# Patient Record
Sex: Male | Born: 1950 | Race: White | Hispanic: No | Marital: Married | Smoking: Former smoker
Health system: Southern US, Community
[De-identification: ages and names within clinical notes are randomized; demographics above are authoritative.]

## PROBLEM LIST (undated history)

## (undated) DIAGNOSIS — I2510 Atherosclerotic heart disease of native coronary artery without angina pectoris: Secondary | ICD-10-CM

## (undated) DIAGNOSIS — R001 Bradycardia, unspecified: Secondary | ICD-10-CM

## (undated) DIAGNOSIS — I7300 Raynaud's syndrome without gangrene: Secondary | ICD-10-CM

## (undated) DIAGNOSIS — K589 Irritable bowel syndrome without diarrhea: Secondary | ICD-10-CM

## (undated) DIAGNOSIS — R55 Syncope and collapse: Secondary | ICD-10-CM

## (undated) HISTORY — PX: ANKLE FRACTURE SURGERY: SHX122

## (undated) HISTORY — PX: CARDIAC CATHETERIZATION: SHX172

## (undated) HISTORY — DX: Raynaud's syndrome without gangrene: I73.00

## (undated) HISTORY — DX: Syncope and collapse: R55

## (undated) HISTORY — DX: Irritable bowel syndrome, unspecified: K58.9

---

## 1988-08-18 HISTORY — PX: ANTERIOR CRUCIATE LIGAMENT REPAIR: SHX115

## 1997-12-05 ENCOUNTER — Ambulatory Visit (HOSPITAL_COMMUNITY): Admission: RE | Admit: 1997-12-05 | Discharge: 1997-12-05 | Payer: Self-pay | Admitting: Orthopaedic Surgery

## 2002-09-02 ENCOUNTER — Ambulatory Visit (HOSPITAL_BASED_OUTPATIENT_CLINIC_OR_DEPARTMENT_OTHER): Admission: RE | Admit: 2002-09-02 | Discharge: 2002-09-02 | Payer: Self-pay | Admitting: Orthopedic Surgery

## 2002-11-09 ENCOUNTER — Ambulatory Visit (HOSPITAL_COMMUNITY): Admission: RE | Admit: 2002-11-09 | Discharge: 2002-11-09 | Payer: Self-pay | Admitting: Gastroenterology

## 2007-08-13 ENCOUNTER — Encounter: Admission: RE | Admit: 2007-08-13 | Discharge: 2007-08-13 | Payer: Self-pay | Admitting: Family Medicine

## 2007-08-16 ENCOUNTER — Encounter: Admission: RE | Admit: 2007-08-16 | Discharge: 2007-08-16 | Payer: Self-pay | Admitting: Family Medicine

## 2011-01-03 NOTE — Op Note (Signed)
   NAME:  Travis Jenkins, Travis Jenkins                          ACCOUNT NO.:  [id number]   MEDICAL RECORD NO.:  [id number]                   PATIENT TYPE:  AMB   LOCATION:  ENDO                                 FACILITY:  [facility]   PHYSICIAN:  John C. Madilyn Fireman, M.D.                 DATE OF BIRTH:  09-30-1950   DATE OF PROCEDURE:  11/09/2002  DATE OF DISCHARGE:                                 OPERATIVE REPORT   PROCEDURE:  Colonoscopy.   INDICATIONS FOR PROCEDURE:  Colon cancer screening in a 60 year old patient  with no prior screening.   DESCRIPTION OF PROCEDURE:  The patient was placed in the left lateral  decubitus position then placed on the pulse monitor with continuous low flow  oxygen delivered by nasal cannula. He was sedated with 100 mcg IV fentanyl  and 10 mg IV Versed. The Olympus video colonoscope was inserted into the  rectum and advanced to the cecum, confirmed by transillumination at  McBurney's point and visualization of the ileocecal valve and appendiceal  orifice. The prep was excellent. The cecum, ascending, transverse,  descending and sigmoid colon all appeared normal with no masses, polyps,  diverticula or other mucosal abnormalities. The rectum likewise appeared  normal and retroflexed view of the anus revealed no obvious internal  hemorrhoids. The colonoscope was then withdrawn and the patient returned to  the recovery room in stable condition. The patient tolerated the procedure  well and there were no immediate complications.   IMPRESSION:  Normal colonoscopy.   PLAN:  Next colon screening by sigmoidoscopy in five years.                                                John C. Madilyn Fireman, M.D.    JCH/MEDQ  D:  11/09/2002  T:  11/09/2002  Job:  [id number]   cc:   Schuyler Amor, M.D.  [address]  [place], [place] [address]  Fax: [phone]   Oley Balm. Georgina Pillion, M.D.  [address]  [place]  [place] [address]  Fax: [phone]

## 2011-01-03 NOTE — Op Note (Signed)
Travis Jenkins, Travis Jenkins                          ACCOUNT NO.:  [id number]   MEDICAL RECORD NO.:  [id number]                   PATIENT TYPE:  AMB   LOCATION:  DSC                                  FACILITY:  [facility]   PHYSICIAN:  Loreta Ave, M.D.              DATE OF BIRTH:  1950/11/08   DATE OF PROCEDURE:  09/02/2002  DATE OF DISCHARGE:                                 OPERATIVE REPORT   PREOPERATIVE DIAGNOSIS:  Medial meniscus tear, left knee.   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   PROCEDURES:  1. Left knee examined under anesthesia.  2. Arthroscopy with partial medial meniscectomy.  3. Chondroplasty of the patella and lateral tibial plateau.   SURGEON:  Loreta Ave, M.D.   ASSISTANT:  Arlys John D. Petrarca, P.A.-C.   ANESTHESIA:  General.   ESTIMATED BLOOD LOSS:  Minimal.   TOURNIQUET:  Not employed.   SPECIMENS:  None.   CULTURES:  None.   COMPLICATIONS:  None.   DRESSING:  Soft compressive.   DESCRIPTION OF PROCEDURE:  Patient brought to the operating room and placed  on the operating table in supine position.  After adequate anesthesia had  been obtained, left knee examined.  Full motion, good stability.  A little  patellofemoral lateral tracking and crepitus but no tethering.  Negative  Lachman and drawer.  Tourniquet and leg holder applied.  Leg prepped and  draped in the usual sterile fashion.  Three portals created, one  superolateral, one each medial and lateral peripatellar.  Inflow catheter  introduced, knee distended, arthroscope introduced, knee inspected.  Lateral  tracking not marked, no tethering.  Focal grade 2-3 chondromalacia, peak of  the patella and both facets.  Treated with chondroplasty to a stable  surface.  The trochlea looked good.  In the medial compartment some grade 1  changes, nothing marked.  Extensive degenerative change of the medial  meniscus posterior half.  Saucerized up to the rim, tapered into remaining  meniscus.  Notch inspected.  Both cruciates were intact to inspection,  probing, and Lachman.  Lateral compartment had no meniscal tear, but there  was a linear fissure with some grade 2 changes.  Margins debrided to a  stable surface.  All loose fragments were removed.  The entire knee examined  and no other  significant findings appreciated.  The instruments and fluid removed.  Portals and knee injected with Marcaine.  Portals closed with 4-0 nylon.  A  sterile compressive dressing applied.  Anesthesia reversed, brought to the  recovery room.  He tolerated the surgery well with no complications.  Loreta Ave, M.D.    DFM/MEDQ  D:  09/02/2002  T:  09/03/2002  Job:  [id number]

## 2014-02-12 ENCOUNTER — Emergency Department (HOSPITAL_COMMUNITY)
Admission: EM | Admit: 2014-02-12 | Discharge: 2014-02-12 | Disposition: A | Discharge status: Home / Self Care | Payer: 59 | Attending: Emergency Medicine | Admitting: Emergency Medicine

## 2014-02-12 ENCOUNTER — Encounter (HOSPITAL_COMMUNITY): Payer: Self-pay | Admitting: Emergency Medicine

## 2014-02-12 DIAGNOSIS — Z87891 Personal history of nicotine dependence: Secondary | ICD-10-CM | POA: Insufficient documentation

## 2014-02-12 DIAGNOSIS — Z8679 Personal history of other diseases of the circulatory system: Secondary | ICD-10-CM | POA: Insufficient documentation

## 2014-02-12 DIAGNOSIS — Z8719 Personal history of other diseases of the digestive system: Secondary | ICD-10-CM | POA: Insufficient documentation

## 2014-02-12 DIAGNOSIS — R55 Syncope and collapse: Secondary | ICD-10-CM | POA: Insufficient documentation

## 2014-02-12 LAB — CBC WITH DIFFERENTIAL/PLATELET
Basophils Absolute: 0 10*3/uL (ref 0.0–0.1)
Basophils Relative: 0 % (ref 0–1)
Eosinophils Absolute: 0 10*3/uL (ref 0.0–0.7)
Eosinophils Relative: 1 % (ref 0–5)
HCT: 40.6 % (ref 39.0–52.0)
Hemoglobin: 14.1 g/dL (ref 13.0–17.0)
Lymphocytes Relative: 15 % (ref 12–46)
Lymphs Abs: 1 10*3/uL (ref 0.7–4.0)
MCH: 31.5 pg (ref 26.0–34.0)
MCHC: 34.7 g/dL (ref 30.0–36.0)
MCV: 90.8 fL (ref 78.0–100.0)
Monocytes Absolute: 0.5 10*3/uL (ref 0.1–1.0)
Monocytes Relative: 8 % (ref 3–12)
Neutro Abs: 5.2 10*3/uL (ref 1.7–7.7)
Neutrophils Relative %: 76 % (ref 43–77)
Platelets: 211 10*3/uL (ref 150–400)
RBC: 4.47 MIL/uL (ref 4.22–5.81)
RDW: 12.6 % (ref 11.5–15.5)
WBC: 6.8 10*3/uL (ref 4.0–10.5)

## 2014-02-12 LAB — BASIC METABOLIC PANEL
BUN: 12 mg/dL (ref 6–23)
CO2: 27 mEq/L (ref 19–32)
Calcium: 9.2 mg/dL (ref 8.4–10.5)
Chloride: 99 mEq/L (ref 96–112)
Creatinine, Ser: 1.13 mg/dL (ref 0.50–1.35)
GFR calc Af Amer: 79 mL/min — ABNORMAL LOW (ref >90)
GFR calc non Af Amer: 68 mL/min — ABNORMAL LOW (ref >90)
Glucose, Bld: 89 mg/dL (ref 70–99)
Potassium: 4.2 mEq/L (ref 3.7–5.3)
Sodium: 140 mEq/L (ref 137–147)

## 2014-02-12 LAB — MAGNESIUM: Magnesium: 2.2 mg/dL (ref 1.5–2.5)

## 2014-02-12 NOTE — Discharge Instructions (Signed)
Syncope °Syncope is a medical term for fainting or passing out. This means you lose consciousness and drop to the ground. People are generally unconscious for less than 5 minutes. You may have some muscle twitches for up to 15 seconds before waking up and returning to normal. Syncope occurs more often in older adults, but it can happen to anyone. While most causes of syncope are not dangerous, syncope can be a sign of a serious medical problem. It is important to seek medical care.  °CAUSES  °Syncope is caused by a sudden drop in blood flow to the brain. The specific cause is often not determined. Factors that can bring on syncope include: °· Taking medicines that lower blood pressure. °· Sudden changes in posture, such as standing up quickly. °· Taking more medicine than prescribed. °· Standing in one place for too long. °· Seizure disorders. °· Dehydration and excessive exposure to heat. °· Low blood sugar (hypoglycemia). °· Straining to have a bowel movement. °· Heart disease, irregular heartbeat, or other circulatory problems. °· Fear, emotional distress, seeing blood, or severe pain. °SYMPTOMS  °Right before fainting, you may: °· Feel dizzy or light-headed. °· Feel nauseous. °· See all white or all black in your field of vision. °· Have cold, clammy skin. °DIAGNOSIS  °Your health care provider will ask about your symptoms, perform a physical exam, and perform an electrocardiogram (ECG) to record the electrical activity of your heart. Your health care provider may also perform other heart or blood tests to determine the cause of your syncope which may include: °· Transthoracic echocardiogram (TTE). During echocardiography, sound waves are used to evaluate how blood flows through your heart. °· Transesophageal echocardiogram (TEE). °· Cardiac monitoring. This allows your health care provider to monitor your heart rate and rhythm in real time. °· Holter monitor. This is a portable device that records your  heartbeat and can help diagnose heart arrhythmias. It allows your health care provider to track your heart activity for several days, if needed. °· Stress tests by exercise or by giving medicine that makes the heart beat faster. °TREATMENT  °In most cases, no treatment is needed. Depending on the cause of your syncope, your health care provider may recommend changing or stopping some of your medicines. °HOME CARE INSTRUCTIONS °· Have someone stay with you until you feel stable. °· Do not drive, use machinery, or play sports until your health care provider says it is okay. °· Keep all follow-up appointments as directed by your health care provider. °· Lie down right away if you start feeling like you might faint. Breathe deeply and steadily. Wait until all the symptoms have passed. °· Drink enough fluids to keep your urine clear or pale yellow. °· If you are taking blood pressure or heart medicine, get up slowly and take several minutes to sit and then stand. This can reduce dizziness. °SEEK IMMEDIATE MEDICAL CARE IF:  °· You have a severe headache. °· You have unusual pain in the chest, abdomen, or back. °· You are bleeding from your mouth or rectum, or you have black or tarry stool. °· You have an irregular or very fast heartbeat. °· You have pain with breathing. °· You have repeated fainting or seizure-like jerking during an episode. °· You faint when sitting or lying down. °· You have confusion. °· You have trouble walking. °· You have severe weakness. °· You have vision problems. °If you fainted, call your local emergency services (911 in [place] Do not drive   yourself to the hospital.  °MAKE SURE YOU: °· Understand these instructions. °· Will watch your condition. °· Will get help right away if you are not doing well or get worse. °Document Released: 08/04/2005 Document Revised: 08/09/2013 Document Reviewed: 10/03/2011 °ExitCare® Patient Information ©2015 [employer]. This information is not intended to replace  advice given to you by your health care provider. Make sure you discuss any questions you have with your health care provider. ° °

## 2014-02-12 NOTE — ED Notes (Signed)
MD Kohut at the bedside.

## 2014-02-12 NOTE — ED Notes (Signed)
Per Patient's Wife, Wife was obtaining patient's blood pressure. He was complaining of some dizziness. Patient went unresponsive with his eyes open, started to drool, and wife called 911. Patient conscious, alert, and oriented when EMS arrived. CBG 58- Given OJ. Second CBG 69. Vitals per EMS: Initial: 136/77, 122/78 Ortho, Last- 116/74, SB. Upon Arrival, Patient was alert and Oriented x4. Patient denies any chest pain or palpitations with dizziness. Patient denies nausea or vomiting.

## 2014-02-12 NOTE — ED Notes (Signed)
Dr. Wilson Singer at the bedside.

## 2014-02-12 NOTE — ED Provider Notes (Signed)
CSN: [id number]     Arrival date & time 02/12/14  1308 History   First MD Initiated Contact with Patient 02/12/14 1309     Chief Complaint  Patient presents with  . Loss of Consciousness     (Consider location/radiation/quality/duration/timing/severity/associated sxs/prior Treatment) HPI  63 year old male with syncope. Onset shortly before arrival. Patient was sitting down smoking a cigar when he began to feel lightheaded and passed out. Per his wife he appeared glassy eyes was unresponsive and was drooling. Fairly quick return to baseline. No seizure activity reported. Patient currently complaining of feeling a little bit tired, otherwise no complaints. Resting bradycardia noted, but patient reports that is baseline heart rate is around 50 at rest. He has no complaints heart rate of 45-55 while I was in the room. Patient has had previous syncopal event. He is actually had a similar episode while smoking a cigar in the past. He works out fairly regularly and has never syncopized with exertion. Reports previous normal stress testing a few years ago. Not on any medications.   History reviewed. No pertinent past medical history. Past Surgical History  Procedure Laterality Date  . Anterior cruciate ligament repair    . Ankle fracture surgery     Family History  Problem Relation Age of Onset  . Diabetes Mother   . Hyperlipidemia Father   . Diabetes Father    History  Substance Use Topics  . Smoking status: Former Smoker    Types: Cigarettes  . Smokeless tobacco: Not on file  . Alcohol Use: 2.4 oz/week    4 Cans of beer per week    Review of Systems  All systems reviewed and negative, other than as noted in HPI.   Allergies  Review of patient's allergies indicates not on file.  Home Medications   Prior to Admission medications   Not on File   BP 124/77  Pulse 45  Temp(Src) 97.5 F (36.4 C) (Oral)  Resp 13  SpO2 97% Physical Exam  Nursing note and vitals  reviewed. Constitutional: He is oriented to person, place, and time. He appears well-developed and well-nourished. No distress.  HENT:  Head: Normocephalic and atraumatic.  Eyes: Conjunctivae and EOM are normal. Pupils are equal, round, and reactive to light. Right eye exhibits no discharge. Left eye exhibits no discharge.  Neck: Neck supple.  Cardiovascular: Normal rate, regular rhythm and normal heart sounds.  Exam reveals no gallop and no friction rub.   No murmur heard. Pulmonary/Chest: Effort normal and breath sounds normal. No respiratory distress.  Abdominal: Soft. He exhibits no distension. There is no tenderness.  Musculoskeletal: He exhibits no edema and no tenderness.  Neurological: He is alert and oriented to person, place, and time. No cranial nerve deficit. He exhibits normal muscle tone. Coordination normal.  Skin: Skin is warm and dry.  Psychiatric: He has a normal mood and affect. His behavior is normal. Thought content normal.    ED Course  Procedures (including critical care time) Labs Review Labs Reviewed  BASIC METABOLIC PANEL - Abnormal; Notable for the following:    GFR calc non Af Amer 68 (*)    GFR calc Af Amer 79 (*)    All other components within normal limits  CBC WITH DIFFERENTIAL  MAGNESIUM    Imaging Review No results found.   EKG Interpretation   Date/Time:  Sunday February 12 2014 13:11:26 EDT Ventricular Rate:  44 PR Interval:  177 QRS Duration: 104 QT Interval:  452 QTC Calculation: 387  R Axis:   47 Text Interpretation:  Sinus bradycardia Minimal ST elevation, anterior  leads No old tracing to compare Confirmed by Wilson Singer  MD, Saanvika Vazques [phone]) on  02/12/2014 2:38:26 PM      MDM   Final diagnoses:  Syncope and collapse    62yM with syncope. No obvious etiology, but nothing overly concerning at this point either. Perhaps cough syncope while smoking cigar?  EKG with sinus bradycardia. Pt reports hx of same. No complaints during my exam when  HR 45-55. BP normal with this. Afebrile. No rate controlling meds. Wife doesn't describe seizure.   W/u unremarkable. Lytes fine. H/H normal. No hypotension. Remains without complaints. Will ambulate. DC if feeling fine. Return precautions discussed. PCP FU.     Virgel Manifold, MD 02/14/14 [phone]

## 2014-02-15 ENCOUNTER — Encounter: Payer: Self-pay | Admitting: *Deleted

## 2014-02-15 ENCOUNTER — Ambulatory Visit (INDEPENDENT_AMBULATORY_CARE_PROVIDER_SITE_OTHER): Payer: 59 | Admitting: Cardiovascular Disease

## 2014-02-15 VITALS — BP 132/75 | HR 45 | Ht 73.0 in | Wt 200.0 lb

## 2014-02-15 DIAGNOSIS — R55 Syncope and collapse: Secondary | ICD-10-CM

## 2014-02-15 DIAGNOSIS — R5381 Other malaise: Secondary | ICD-10-CM

## 2014-02-15 DIAGNOSIS — R079 Chest pain, unspecified: Secondary | ICD-10-CM

## 2014-02-15 DIAGNOSIS — R5383 Other fatigue: Secondary | ICD-10-CM

## 2014-02-15 DIAGNOSIS — R001 Bradycardia, unspecified: Secondary | ICD-10-CM

## 2014-02-15 DIAGNOSIS — I498 Other specified cardiac arrhythmias: Secondary | ICD-10-CM

## 2014-02-15 DIAGNOSIS — Z8249 Family history of ischemic heart disease and other diseases of the circulatory system: Secondary | ICD-10-CM

## 2014-02-15 NOTE — Patient Instructions (Signed)
Continue all current medications. Labs for TSH, freeT4 Your physician has requested that you have an echocardiogram. Echocardiography is a painless test that uses sound waves to create images of your heart. It provides your doctor with information about the size and shape of your heart and how well your heart's chambers and valves are working. This procedure takes approximately one hour. There are no restrictions for this procedure. Your physician has requested that you have en exercise stress myoview. For further information please visit HugeFiesta.tn. Please follow instruction sheet, as given. Your physician has recommended that you wear a 30 day event monitor. Event monitors are medical devices that record the heart's electrical activity. Doctors most often [place] these monitors to diagnose arrhythmias. Arrhythmias are problems with the speed or rhythm of the heartbeat. The monitor is a small, portable device. You can wear one while you do your normal daily activities. This is usually used to diagnose what is causing palpitations/syncope (passing out). Office will contact with results via phone or letter.   Follow up in  6-8 weeks

## 2014-02-15 NOTE — Progress Notes (Signed)
Patient ID: Azael Ragain, male   DOB: Jun 12, 1951, 63 y.o.   MRN: [id number]       CARDIOLOGY CONSULT NOTE  Patient ID: Lathon Adan MRN: [id number] DOB/AGE: 03-Aug-1951 63 y.o.  Admit date: (Not on file) Primary Physician Lujean Amel, MD  Reason for Consultation: Syncope and bradycardia  HPI: The patient is a 63 year old male who has been referred for the evaluation and treatment of syncope. He was evaluated in the emergency department on June 28 after sustaining a syncopal episode shortly after smoking a cigar. He had been outside on his back porch and then felt lightheaded and dizzy. He then walked into the house, sat down on a couch, and then lost consciousness. His wife said he had a "blank stare and appeared glassy-eyed". She is a Marine scientist at [employer] There was no seizure activity noted. An ECG performed in the ED demonstrated sinus bradycardia with a heart rate of 44 beats per minute. He reportedly passed out for approximately 45-60 seconds as per his wife. It was witnessed. Antecedent symptoms included lightheadedness but not chest pain, palpitations, or dyspnea. He did have urinary incontinence and some diaphoresis afterwards. He has a prior episode of near syncope also after smoking a cigar. He reportedly saw Dr. Eustace Quail several years ago and had a normal stress test. He has occasional chest discomfort but never with significant exertion including running and swimming. CBC and basic metabolic panel were unremarkable.  He had previously been running up to 5 miles but has been limited in the past several months by bilateral knee arthritis. He now swims for exercise and has no exertional chest discomfort. He denies palpitations, orthopnea, and leg swelling. He denies a history of thyroid disease. He was told he had a murmur in childhood but there is no history of childhood seizures. His wife says that he does not snore. The patient does experience significant fatigue at times  at around 2 PM and then has to take a nap.  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 Does not smoke cigarettes. Married with children.  FamHx: Father had valve surgery and aneurysm in late 50's.   Allergies  Allergen Reactions  . Erythromycin Rash  . Tetracyclines & Related Rash    Current Outpatient Prescriptions  Medication Sig Dispense Refill  . aspirin EC 81 MG tablet Take 81 mg by mouth daily.       No current facility-administered medications for this visit.    Past Medical History  Diagnosis Date  . IBS (irritable bowel syndrome)   . Raynaud's disease   . Syncope     Past Surgical History  Procedure Laterality Date  . Anterior cruciate ligament repair  1990  . Ankle fracture surgery      History   Social History  . Marital Status: Married    Spouse Name: N/A    Number of Children: N/A  . Years of Education: N/A   Occupational History  . Not on file.   Social History Main Topics  . Smoking status: Former Smoker -- 0.50 packs/day for 12 years    Types: Cigarettes    Start date: 06/20/1966    Quit date: 08/19/1971  . Smokeless tobacco: Never Used  . Alcohol Use: 2.4 oz/week    4 Cans of beer per week  . Drug Use: No  . Sexual Activity: Not on file   Other Topics Concern  . Not on file   Social History Narrative  .  No narrative on file       Prior to Admission medications   Medication Sig Start Date End Date Taking? Authorizing Provider  aspirin EC 81 MG tablet Take 81 mg by mouth daily.   Yes Historical Provider, MD     Review of systems complete and found to be negative unless listed above in HPI     Physical exam Blood pressure 132/75, pulse 45, height 6\' 1"  (1.854 m), weight 200 lb (90.719 kg). General: NAD Neck: No JVD, no thyromegaly or thyroid nodule.  Lungs: Clear to auscultation bilaterally with normal respiratory effort. CV: Nondisplaced PMI. Bradycardic, regular rhythm, normal  S1/S2, no S3/S4, no murmur.  No peripheral edema.  No carotid bruit.  Normal pedal pulses.  Abdomen: Soft, nontender, no hepatosplenomegaly, no distention.  Skin: Intact without lesions or rashes.  Neurologic: Alert and oriented x 3.  Psych: Normal affect. Extremities: No clubbing or cyanosis.  HEENT: Normal.   Labs:   Lab Results  Component Value Date   WBC 6.8 02/12/2014   HGB 14.1 02/12/2014   HCT 40.6 02/12/2014   MCV 90.8 02/12/2014   PLT 211 02/12/2014    Recent Labs Lab 02/12/14 1335  NA 140  K 4.2  CL 99  CO2 27  BUN 12  CREATININE 1.13  CALCIUM 9.2  GLUCOSE 89   No results found for this basename: CKTOTAL, CKMB, CKMBINDEX, TROPONINI    No results found for this basename: CHOL   No results found for this basename: HDL   No results found for this basename: LDLCALC   No results found for this basename: TRIG   No results found for this basename: CHOLHDL   No results found for this basename: LDLDIRECT         Studies: No results found.  ASSESSMENT AND PLAN:  1. Syncope and resting bradycardia: Given that he is apparently fit from a cardiovascular standpoint, he may have a normal resting bradycardia. However, given his syncopal episode as well as fatigue, I want to rule out readily treatable etiologies. I will check a TSH and free T4 to evaluate for hypothyroidism. I will also obtain a 30 day event monitor to evaluate for bradyarrhythmias and pauses. I will obtain an echocardiogram to evaluate for structural heart disease. I will also obtain an exercise Cardiolite stress test to evaluate for ischemia given his chest discomfort, as well as to evaluate for chronotropic incompetence. 2. Chest discomfort: Exercise Cardiolite stress test as noted above. It is reassuring that he does not experience exertional symptoms. 3. Fatigue: History does not appear to be consistent with sleep apnea. Obtain tests as noted above.  Dispo: f/u 6-8 weeks.  Signed: Kate Sable, M.D., F.A.C.C.  02/15/2014, 11:23 AM

## 2014-02-16 ENCOUNTER — Other Ambulatory Visit: Payer: Self-pay | Admitting: *Deleted

## 2014-02-16 DIAGNOSIS — R55 Syncope and collapse: Secondary | ICD-10-CM

## 2014-02-16 DIAGNOSIS — R079 Chest pain, unspecified: Secondary | ICD-10-CM

## 2014-02-16 DIAGNOSIS — R001 Bradycardia, unspecified: Secondary | ICD-10-CM

## 2014-02-22 DIAGNOSIS — R079 Chest pain, unspecified: Secondary | ICD-10-CM

## 2014-02-22 DIAGNOSIS — R55 Syncope and collapse: Secondary | ICD-10-CM

## 2014-02-23 ENCOUNTER — Encounter (HOSPITAL_COMMUNITY)
Admission: RE | Admit: 2014-02-23 | Discharge: 2014-02-23 | Disposition: A | Discharge status: Home / Self Care | Payer: 59 | Source: Ambulatory Visit | Attending: Cardiovascular Disease | Admitting: Cardiovascular Disease

## 2014-02-23 ENCOUNTER — Other Ambulatory Visit: Payer: Self-pay | Admitting: Cardiovascular Disease

## 2014-02-23 ENCOUNTER — Ambulatory Visit (HOSPITAL_COMMUNITY)
Admission: RE | Admit: 2014-02-23 | Discharge: 2014-02-23 | Disposition: A | Discharge status: Home / Self Care | Payer: 59 | Source: Ambulatory Visit | Attending: Cardiovascular Disease | Admitting: Cardiovascular Disease

## 2014-02-23 ENCOUNTER — Encounter (HOSPITAL_COMMUNITY): Payer: Self-pay

## 2014-02-23 DIAGNOSIS — R55 Syncope and collapse: Secondary | ICD-10-CM

## 2014-02-23 DIAGNOSIS — I498 Other specified cardiac arrhythmias: Secondary | ICD-10-CM | POA: Insufficient documentation

## 2014-02-23 DIAGNOSIS — R001 Bradycardia, unspecified: Secondary | ICD-10-CM

## 2014-02-23 DIAGNOSIS — R079 Chest pain, unspecified: Secondary | ICD-10-CM

## 2014-02-23 DIAGNOSIS — I059 Rheumatic mitral valve disease, unspecified: Secondary | ICD-10-CM

## 2014-02-23 MED ORDER — SODIUM CHLORIDE 0.9 % IJ SOLN
10.0000 mL | INTRAMUSCULAR | Status: DC | PRN
  Administered 2014-02-23: 10 mL via INTRAVENOUS

## 2014-02-23 MED ORDER — TECHNETIUM TC 99M SESTAMIBI - CARDIOLITE
30.0000 | Freq: Once | INTRAVENOUS | Status: AC | PRN
  Administered 2014-02-23: 12:00:00 30 via INTRAVENOUS

## 2014-02-23 MED ORDER — SODIUM CHLORIDE 0.9 % IJ SOLN
INTRAMUSCULAR | Status: AC
  Administered 2014-02-23: 10 mL via INTRAVENOUS
  Filled 2014-02-23: qty 10

## 2014-02-23 MED ORDER — TECHNETIUM TC 99M SESTAMIBI GENERIC - CARDIOLITE
10.0000 | Freq: Once | INTRAVENOUS | Status: AC | PRN
  Administered 2014-02-23: 10 via INTRAVENOUS

## 2014-02-23 NOTE — Progress Notes (Signed)
  Echocardiogram 2D Echocardiogram has been performed.  [place], [place] 02/23/2014, 9:02 AM

## 2014-02-23 NOTE — Progress Notes (Signed)
Stress Lab Nurses Notes - Travis Jenkins  Travis Jenkins 02/23/2014 Reason for doing test: Syncope Type of test: Stress Cardiolite Nurse performing test: Gerrit Halls, RN Nuclear Medicine Tech: Melburn Hake Echo Tech: Not Applicable MD performing test: Koneswaran/K.Purcell Nails NP Family MD: [facility] explained and consent signed: Yes.   IV started: 22g jelco, Saline lock flushed, No redness or edema and Saline lock started in radiology Symptoms: SOB Treatment/Intervention: None Reason test stopped: fatigue and reached target HR After recovery IV was: Discontinued via X-ray tech and No redness or edema Patient to return to Nuc. Med at : 12:30 Patient discharged: Home Patient's Condition upon discharge was: stable Comments: During test peak BP 186/79 & HR 171.  Recovery BP 157/86 & HR 88.  Symptoms resolved in recovery. [name]

## 2014-02-24 ENCOUNTER — Telehealth: Payer: Self-pay | Admitting: *Deleted

## 2014-02-24 LAB — TSH: TSH: 3.215 u[IU]/mL (ref 0.350–4.500)

## 2014-02-24 LAB — T4, FREE: Free T4: 1.04 ng/dL (ref 0.80–1.80)

## 2014-02-24 NOTE — Telephone Encounter (Signed)
Patient notified of all results below.  Follow up scheduled for 03/30/2014 with Dr. Bronson Ing.

## 2014-02-24 NOTE — Telephone Encounter (Signed)
STRESS TEST -  Notes Recorded by Herminio Commons, MD on 02/23/2014 at 3:15 PM Excellent exercise tolerance. Probably normal blood flow to heart based on nuclear pictures. Will discuss details at ov.  ECHO -  Notes Recorded by Herminio Commons, MD on 02/23/2014 at 5:51 PM Normal pumping function.  LABS -  Notes Recorded by Herminio Commons, MD on 02/24/2014 at 9:28 AM Normal.

## 2014-03-30 ENCOUNTER — Ambulatory Visit (INDEPENDENT_AMBULATORY_CARE_PROVIDER_SITE_OTHER): Payer: PRIVATE HEALTH INSURANCE | Admitting: Cardiovascular Disease

## 2014-03-30 ENCOUNTER — Encounter: Payer: Self-pay | Admitting: Cardiovascular Disease

## 2014-03-30 VITALS — BP 135/78 | HR 58 | Ht 73.0 in | Wt 210.0 lb

## 2014-03-30 DIAGNOSIS — R079 Chest pain, unspecified: Secondary | ICD-10-CM

## 2014-03-30 DIAGNOSIS — I471 Supraventricular tachycardia: Secondary | ICD-10-CM

## 2014-03-30 DIAGNOSIS — R5383 Other fatigue: Secondary | ICD-10-CM

## 2014-03-30 DIAGNOSIS — R001 Bradycardia, unspecified: Secondary | ICD-10-CM

## 2014-03-30 DIAGNOSIS — R5381 Other malaise: Secondary | ICD-10-CM

## 2014-03-30 DIAGNOSIS — I498 Other specified cardiac arrhythmias: Secondary | ICD-10-CM

## 2014-03-30 DIAGNOSIS — Z8249 Family history of ischemic heart disease and other diseases of the circulatory system: Secondary | ICD-10-CM

## 2014-03-30 DIAGNOSIS — R55 Syncope and collapse: Secondary | ICD-10-CM

## 2014-03-30 NOTE — Progress Notes (Signed)
Patient ID: Travis Jenkins, male   DOB: [birth year], 63 y.o.   MRN: [id number]      SUBJECTIVE: The patient is here to followup on the results of cardiovascular testing performed for the evaluation of syncope. Nuclear myocardial perfusion study demonstrated artifact with no clear evidence of ischemia or scar. TSH and free T4 were normal. Echocardiography demonstrated normal left ventricular systolic function with mild to moderate concentric LVH. Event monitor demonstrated one episode of SVT with a heart rate of 160 bpm and the patient was asymptomatic. He has been doing well since that time, and has had no episodes of chest pain, dizziness, shortness of breath, palpitations or syncope. He continues to swim and stays active.   Review of Systems: As per "subjective", otherwise negative.  Allergies  Allergen Reactions  . Erythromycin Rash  . Tetracyclines & Related Rash    No current outpatient prescriptions on file.   No current facility-administered medications for this visit.    Past Medical History  Diagnosis Date  . IBS (irritable bowel syndrome)   . Raynaud's disease   . Syncope     Past Surgical History  Procedure Laterality Date  . Anterior cruciate ligament repair  1990  . Ankle fracture surgery      History   Social History  . Marital Status: Married    Spouse Name: N/A    Number of Children: N/A  . Years of Education: N/A   Occupational History  . Not on file.   Social History Main Topics  . Smoking status: Former Smoker -- 0.50 packs/day for 12 years    Types: Cigarettes    Start date: 06/20/1966    Quit date: 08/19/1971  . Smokeless tobacco: Never Used  . Alcohol Use: 2.4 oz/week    4 Cans of beer per week  . Drug Use: No  . Sexual Activity: Not on file   Other Topics Concern  . Not on file   Social History Narrative  . No narrative on file     Filed Vitals:   03/30/14 0844  BP: 135/78  Pulse: 58  Height: 6\' 1"  (1.854 m)  Weight: 210 lb  (95.255 kg)  SpO2: 96%    PHYSICAL EXAM General: NAD Neck: No JVD, no thyromegaly. Lungs: Clear to auscultation bilaterally with normal respiratory effort. CV: Nondisplaced PMI.  Regular rate and rhythm, normal S1/S2, no S3/S4, no murmur. No pretibial or periankle edema.  No carotid bruit.  Normal pedal pulses.  Abdomen: Soft, nontender, no hepatosplenomegaly, no distention.  Neurologic: Alert and oriented x 3.  Psych: Normal affect. Extremities: No clubbing or cyanosis.   ECG: reviewed and available in electronic records.      ASSESSMENT AND PLAN: 1. Syncope and resting bradycardia: This isolated episode may have been a vasovagal phenomenon vs SVT. Given that he is apparently fit from a cardiovascular standpoint, he may have a normal resting bradycardia. Because of this, I am unable to utilize AV nodal blocking agents to suppress further episodes of SVT. I discussed the option of ablation which is potentially curative, but he prefers (and I think this is reasonable) to monitor him clinically going forward.   2. Chest discomfort: No further episodes. Exercise Cardiolite stress test results as noted above. It is reassuring that he does not experience exertional symptoms.   3. Fatigue: History does not appear to be consistent with sleep apnea. Will monitor.  4. SVT: See #1.  Dispo: f/u 3-4 months.   Kate Sable, M.D., F.A.C.C.

## 2014-03-30 NOTE — Patient Instructions (Signed)
Continue all current medications. Your physician wants you to follow up in:  3-4 months.  You will receive a reminder letter in the mail one-two months in advance.  If you don't receive a letter, please call our office to schedule the follow up appointment.

## 2014-06-30 ENCOUNTER — Ambulatory Visit: Payer: PRIVATE HEALTH INSURANCE | Admitting: Cardiovascular Disease

## 2017-04-28 ENCOUNTER — Emergency Department (HOSPITAL_COMMUNITY): Payer: BLUE CROSS/BLUE SHIELD

## 2017-04-28 ENCOUNTER — Inpatient Hospital Stay (HOSPITAL_COMMUNITY)
Admission: EM | Admit: 2017-04-28 | Discharge: 2017-05-02 | DRG: 981 | Disposition: A | Discharge status: Home / Self Care | Payer: BLUE CROSS/BLUE SHIELD | Attending: Internal Medicine | Admitting: Internal Medicine

## 2017-04-28 ENCOUNTER — Encounter (HOSPITAL_COMMUNITY): Payer: Self-pay | Admitting: Emergency Medicine

## 2017-04-28 DIAGNOSIS — Z955 Presence of coronary angioplasty implant and graft: Secondary | ICD-10-CM

## 2017-04-28 DIAGNOSIS — R7989 Other specified abnormal findings of blood chemistry: Secondary | ICD-10-CM

## 2017-04-28 DIAGNOSIS — T782XXA Anaphylactic shock, unspecified, initial encounter: Secondary | ICD-10-CM | POA: Diagnosis present

## 2017-04-28 DIAGNOSIS — T380X5A Adverse effect of glucocorticoids and synthetic analogues, initial encounter: Secondary | ICD-10-CM | POA: Diagnosis not present

## 2017-04-28 DIAGNOSIS — Z881 Allergy status to other antibiotic agents status: Secondary | ICD-10-CM

## 2017-04-28 DIAGNOSIS — E7800 Pure hypercholesterolemia, unspecified: Secondary | ICD-10-CM

## 2017-04-28 DIAGNOSIS — R778 Other specified abnormalities of plasma proteins: Secondary | ICD-10-CM

## 2017-04-28 DIAGNOSIS — Z87891 Personal history of nicotine dependence: Secondary | ICD-10-CM

## 2017-04-28 DIAGNOSIS — R079 Chest pain, unspecified: Secondary | ICD-10-CM

## 2017-04-28 DIAGNOSIS — Z8349 Family history of other endocrine, nutritional and metabolic diseases: Secondary | ICD-10-CM

## 2017-04-28 DIAGNOSIS — R55 Syncope and collapse: Secondary | ICD-10-CM | POA: Diagnosis present

## 2017-04-28 DIAGNOSIS — Z8249 Family history of ischemic heart disease and other diseases of the circulatory system: Secondary | ICD-10-CM

## 2017-04-28 DIAGNOSIS — T63461A Toxic effect of venom of wasps, accidental (unintentional), initial encounter: Secondary | ICD-10-CM | POA: Diagnosis not present

## 2017-04-28 DIAGNOSIS — N182 Chronic kidney disease, stage 2 (mild): Secondary | ICD-10-CM

## 2017-04-28 DIAGNOSIS — E785 Hyperlipidemia, unspecified: Secondary | ICD-10-CM | POA: Diagnosis present

## 2017-04-28 DIAGNOSIS — Z91030 Bee allergy status: Secondary | ICD-10-CM

## 2017-04-28 DIAGNOSIS — I214 Non-ST elevation (NSTEMI) myocardial infarction: Secondary | ICD-10-CM

## 2017-04-28 DIAGNOSIS — I21A1 Myocardial infarction type 2: Secondary | ICD-10-CM | POA: Diagnosis present

## 2017-04-28 DIAGNOSIS — D72829 Elevated white blood cell count, unspecified: Secondary | ICD-10-CM

## 2017-04-28 DIAGNOSIS — I7300 Raynaud's syndrome without gangrene: Secondary | ICD-10-CM | POA: Diagnosis present

## 2017-04-28 DIAGNOSIS — Z91038 Other insect allergy status: Secondary | ICD-10-CM

## 2017-04-28 DIAGNOSIS — I2510 Atherosclerotic heart disease of native coronary artery without angina pectoris: Secondary | ICD-10-CM | POA: Diagnosis present

## 2017-04-28 DIAGNOSIS — I25119 Atherosclerotic heart disease of native coronary artery with unspecified angina pectoris: Secondary | ICD-10-CM | POA: Diagnosis present

## 2017-04-28 LAB — I-STAT CHEM 8, ED
BUN: 16 mg/dL (ref 6–20)
Calcium, Ion: 0.98 mmol/L — ABNORMAL LOW (ref 1.15–1.40)
Chloride: 104 mmol/L (ref 101–111)
Creatinine, Ser: 1.3 mg/dL — ABNORMAL HIGH (ref 0.61–1.24)
Glucose, Bld: 151 mg/dL — ABNORMAL HIGH (ref 65–99)
HCT: 39 % (ref 39.0–52.0)
Hemoglobin: 13.3 g/dL (ref 13.0–17.0)
Potassium: 3.9 mmol/L (ref 3.5–5.1)
Sodium: 142 mmol/L (ref 135–145)
TCO2: 26 mmol/L (ref 22–32)

## 2017-04-28 LAB — I-STAT TROPONIN, ED: Troponin i, poc: 0.34 ng/mL (ref 0.00–0.08)

## 2017-04-28 MED ORDER — FAMOTIDINE IN NACL 20-0.9 MG/50ML-% IV SOLN
20.0000 mg | Freq: Once | INTRAVENOUS | Status: AC
  Administered 2017-04-28: 20 mg via INTRAVENOUS
  Filled 2017-04-28: qty 50

## 2017-04-28 MED ORDER — EPINEPHRINE 0.3 MG/0.3ML IJ SOAJ
0.3000 mg | Freq: Once | INTRAMUSCULAR | Status: AC
  Administered 2017-04-28: 0.3 mg via INTRAMUSCULAR
  Filled 2017-04-28: qty 0.3

## 2017-04-28 MED ORDER — HEPARIN (PORCINE) IN NACL 100-0.45 UNIT/ML-% IJ SOLN
1200.0000 [IU]/h | INTRAMUSCULAR | Status: DC
  Administered 2017-04-29: 1400 [IU]/h via INTRAVENOUS
  Administered 2017-04-29: 1200 [IU]/h via INTRAVENOUS
  Filled 2017-04-28 (×4): qty 250

## 2017-04-28 MED ORDER — HEPARIN BOLUS VIA INFUSION
4000.0000 [IU] | Freq: Once | INTRAVENOUS | Status: AC
  Administered 2017-04-29: 4000 [IU] via INTRAVENOUS
  Filled 2017-04-28: qty 4000

## 2017-04-28 MED ORDER — ASPIRIN 81 MG PO CHEW
324.0000 mg | CHEWABLE_TABLET | Freq: Once | ORAL | Status: AC
  Administered 2017-04-28: 324 mg via ORAL
  Filled 2017-04-28: qty 4

## 2017-04-28 MED ORDER — ONDANSETRON HCL 4 MG/2ML IJ SOLN
4.0000 mg | Freq: Once | INTRAMUSCULAR | Status: AC
  Administered 2017-04-28: 4 mg via INTRAVENOUS
  Filled 2017-04-28: qty 2

## 2017-04-28 MED ORDER — SODIUM CHLORIDE 0.9 % IV BOLUS (SEPSIS)
1000.0000 mL | Freq: Once | INTRAVENOUS | Status: AC
  Administered 2017-04-28: 1000 mL via INTRAVENOUS

## 2017-04-28 NOTE — ED Notes (Signed)
Patients daughter came out saying that patient is very uncomfortable and is requesting medicine and RN. MD made aware by RN.

## 2017-04-28 NOTE — Progress Notes (Signed)
ANTICOAGULATION CONSULT NOTE - Initial Consult  Pharmacy Consult:  Heparin Indication: chest pain/ACS  Allergies  Allergen Reactions  . Erythromycin Rash  . Tetracyclines & Related Rash    Patient Measurements: Height: 6\' 1"  (185.4 cm) Weight: 225 lb (102.1 kg) IBW/kg (Calculated) : 79.9 Heparin Dosing Weight: 100 kg  Vital Signs: Temp: 97.8 F (36.6 C) (09/11 2122) Temp Source: Oral (09/11 2122) BP: 131/77 (09/11 2315) Pulse Rate: 87 (09/11 2315)  Labs:  Recent Labs  04/28/17 2230 04/28/17 2323  HGB 14.6 13.3  HCT 42.4 39.0  PLT PENDING  --   CREATININE  --  1.30*    Estimated Creatinine Clearance: 72.1 mL/min (A) (by C-G formula based on SCr of 1.3 mg/dL (H)).   Medical History: Past Medical History:  Diagnosis Date  . IBS (irritable bowel syndrome)   . Raynaud's disease   . Syncope       Assessment: 67 YOM presented with an allergic reaction after being stung by yellowjackets.  Patient's troponin is elevated and Pharmacy consulted to initiate IV heparin for r/o ACS.  Baseline labs reviewed.   Goal of Therapy:  Heparin level 0.3-0.7 units/ml Monitor platelets by anticoagulation protocol: Yes    Plan:  Heparin 4000 units IV bolus, then Heparin gt at 1400 units/hr Check 6 hr heparin level Daily heparin level and CBC   Aarohi Redditt D. Mina Marble, PharmD, BCPS Pager:  [phone] 04/28/2017, 11:43 PM

## 2017-04-28 NOTE — ED Provider Notes (Signed)
[facility] DEPT Provider Note   CSN: [id number] Arrival date & time: 04/28/17  2053     History   Chief Complaint Chief Complaint  Patient presents with  . Allergic Reaction    HPI Travis Jenkins is a 66 y.o. male.  He presents for evaluation of generalized weakness, abdominal cramping, near syncope, and "gasping,", since being stung multiple times by yellow jackets.  He was attempting to clear a nest, when he was stung.  He also recalls being stung about 2 weeks ago, and having swelling, on his hand and fingers where he was stung.  No prior history of allergies, to bee sting.  No recent illnesses including fever, chills, cough, nausea, vomiting, focal weakness or paresthesia.  He was transferred by EMS, and during transport received Solu-Medrol, and Benadryl, intravenously.  There are no other known modifying factors.  HPI  Past Medical History:  Diagnosis Date  . IBS (irritable bowel syndrome)   . Raynaud's disease   . Syncope     Patient Active Problem List   Diagnosis Date Noted  . Syncope 02/15/2014  . Bradycardia 02/15/2014    Past Surgical History:  Procedure Laterality Date  . ANKLE FRACTURE SURGERY    . ANTERIOR CRUCIATE LIGAMENT REPAIR  1990       Home Medications    Prior to Admission medications   Not on File    Family History Family History  Problem Relation Age of Onset  . Hyperlipidemia Father   . Diabetes Father   . Heart disease Father   . Diabetes Mother   . Parkinson's disease Mother   . Heart disease Mother     Social History Social History  Substance Use Topics  . Smoking status: Former Smoker    Packs/day: 0.50    Years: 12.00    Types: Cigarettes    Start date: 06/20/1966    Quit date: 08/19/1971  . Smokeless tobacco: Never Used  . Alcohol use 2.4 oz/week    4 Cans of beer per week     Allergies   Erythromycin and Tetracyclines & related   Review of Systems Review of Systems  All other systems reviewed and are  negative.    Physical Exam Updated Vital Signs BP 131/77   Pulse 87   Temp 97.8 F (36.6 C) (Oral)   Resp 17   Ht 6\' 1"  (1.854 m)   Wt 102.1 kg (225 lb)   SpO2 100%   BMI 29.69 kg/m   Physical Exam  Constitutional: He is oriented to person, place, and time. He appears well-developed and well-nourished. He appears distressed (He is uncomfortable).  HENT:  Head: Normocephalic and atraumatic.  Right Ear: External ear normal.  Left Ear: External ear normal.  Eyes: Pupils are equal, round, and reactive to light. Conjunctivae and EOM are normal.  Neck: Normal range of motion and phonation normal. Neck supple.  Cardiovascular: Normal rate, regular rhythm and normal heart sounds.   Pulmonary/Chest: Effort normal and breath sounds normal. No respiratory distress. He has no wheezes. He exhibits no bony tenderness.  Abdominal: Soft. He exhibits no distension. There is no tenderness.  Musculoskeletal: Normal range of motion.  Neurological: He is alert and oriented to person, place, and time. No cranial nerve deficit or sensory deficit. He exhibits normal muscle tone. Coordination normal.  No dysarthria, aphasia or nystagmus.  Skin: Skin is warm, dry and intact. There is pallor.  Scattered areas of red raised tissue, consistent with Hymenoptera sting, left upper  back and left arm.  Total number suspected, 4.  Psychiatric: He has a normal mood and affect. His behavior is normal. Judgment and thought content normal.  Nursing note and vitals reviewed.    ED Treatments / Results  Labs (all labs ordered are listed, but only abnormal results are displayed) Labs Reviewed  CBC WITH DIFFERENTIAL/PLATELET - Abnormal; Notable for the following:       Result Value   WBC 19.7 (*)    Neutro Abs 16.5 (*)    Monocytes Absolute 1.8 (*)    All other components within normal limits  I-STAT CHEM 8, ED - Abnormal; Notable for the following:    Creatinine, Ser 1.30 (*)    Glucose, Bld 151 (*)     Calcium, Ion 0.98 (*)    All other components within normal limits  I-STAT TROPONIN, ED - Abnormal; Notable for the following:    Troponin i, poc 0.34 (*)    All other components within normal limits    EKG  EKG Interpretation  Date/Time:  Tuesday April 28 2017 21:01:46 EDT Ventricular Rate:  76 PR Interval:    QRS Duration: 98 QT Interval:  385 QTC Calculation: 433 R Axis:   58 Text Interpretation:  Sinus rhythm Borderline T abnormalities, lateral leads Since last tracing rate faster and nonspecific lateral T wave abnormality Confirmed by Daleen Bo [phone]) on 04/28/2017 9:06:14 PM       Radiology Dg Chest Port 1 View  Result Date: 04/28/2017 CLINICAL DATA:  Initial evaluation for acute allergic reaction. EXAM: PORTABLE CHEST 1 VIEW COMPARISON:  None available. FINDINGS: Transverse heart size at the upper limits of normal. Mediastinal silhouette normal. Lungs mildly hypoinflated. No focal infiltrates. No pulmonary edema or pleural effusion. No pneumothorax. No acute osseous abnormality. Degenerative changes noted within the visualized spine. IMPRESSION: No active cardiopulmonary disease. Electronically Signed   By: Jeannine Boga M.D.   On: 04/28/2017 22:24    Procedures Procedures (including critical care time)  Medications Ordered in ED Medications  aspirin chewable tablet 324 mg (not administered)  ondansetron (ZOFRAN) injection 4 mg (4 mg Intravenous Given 04/28/17 2150)  sodium chloride 0.9 % bolus 1,000 mL (1,000 mLs Intravenous New Bag/Given 04/28/17 2152)  EPINEPHrine (EPI-PEN) injection 0.3 mg (0.3 mg Intramuscular Given 04/28/17 2151)  famotidine (PEPCID) IVPB 20 mg premix (0 mg Intravenous Stopped 04/28/17 2222)     Initial Impression / Assessment and Plan / ED Course  I have reviewed the triage vital signs and the nursing notes.  Pertinent labs & imaging results that were available during my care of the patient were reviewed by me and considered in my  medical decision making (see chart for details).  Clinical Course as of Apr 28 2342  Tue Apr 28, 2017  2306 He is feeling better at this time.  He does not feel nauseated.  Another family members here now and states that the patient had a period of time when he "lost his faculties," around 6:38 PM tonight, following the beestings.  [EW]    Clinical Course User Index [EW] Daleen Bo, MD     Patient Vitals for the past 24 hrs:  BP Temp Temp src Pulse Resp SpO2 Height Weight  04/28/17 2315 131/77 - - 87 17 100 % - -  04/28/17 2300 126/77 - - 85 13 98 % - -  04/28/17 2245 120/69 - - 82 16 100 % - -  04/28/17 2230 136/73 - - 80 18 99 % - -  04/28/17 2215 (!) 143/84 - - 77 20 100 % - -  04/28/17 2200 - - - 80 17 100 % - -  04/28/17 2145 (!) 139/95 - - 73 17 100 % - -  04/28/17 2130 135/86 - - 70 19 100 % - -  04/28/17 2122 129/79 97.8 F (36.6 C) Oral 73 18 100 % - -  04/28/17 2115 129/79 - - 74 17 100 % - -  04/28/17 2102 - - - - - - 6\' 1"  (1.854 m) 102.1 kg (225 lb)  04/28/17 2056 - - - - - 100 % - -   11:46 PM-call requested from cardiology. Discussed with Dr. Louann Lions.  He requests hospitalist admission and he will see as a Optometrist.    11:43 PM Reevaluation with update and discussion. After initial assessment and treatment, an updated evaluation reveals he states he is more comfortable now, denies abdominal cramping, and has had some occasional "reflux" discomfort in his lower chest.  States that this discomfort is like a burning, and gets better when he moves around to a different position such as "lying on my side."  Findings discussed with patient and family member, all questions answered. Terril Amaro L   12:18 AM-Consult complete with hospitalist. Patient case explained and discussed.  He agrees to admit patient for further evaluation and treatment. Call ended at 12:48   CRITICAL CARE Performed by: Richarda Blade Total critical care time: 45 minutes Critical  care time was exclusive of separately billable procedures and treating other patients. Critical care was necessary to treat or prevent imminent or life-threatening deterioration. Critical care was time spent personally by me on the following activities: development of treatment plan with patient and/or surrogate as well as nursing, discussions with consultants, evaluation of patient's response to treatment, examination of patient, obtaining history from patient or surrogate, ordering and performing treatments and interventions, ordering and review of laboratory studies, ordering and review of radiographic studies, pulse oximetry and re-evaluation of patient's condition.   Final Clinical Impressions(s) / ED Diagnoses   Final diagnoses:  Anaphylaxis, initial encounter  Hymenoptera allergy  NSTEMI (non-ST elevated myocardial infarction) [facility])   Patient with apparent hymenoptera stings, and clinical evidence for anaphylaxis.  He was uncomfortable with abdominal distress, and screening evaluation, included on testing, which was elevated.  EKG does not indicate ST elevation.  He will require evaluation, by cardiology and admission for observation and trending troponin.     Nursing Notes Reviewed/ Care Coordinated Applicable Imaging Reviewed Interpretation of Laboratory Data incorporated into ED treatment   Plan: Admit  New Prescriptions New Prescriptions   No medications on file     Daleen Bo, MD 04/29/17 [phone]

## 2017-04-28 NOTE — ED Triage Notes (Signed)
Per [facility]: Pt to ED from home following multitude of symptoms - EMS reports patient was outside trying to break up a yellowjackets nest and was swarmed, getting stung 4 times (on back, L elbow, neck). Patient reports he felt the bee stings, sat down on porch, had some dizziness and felt weak. Patient arrives groaning, but is unable to tell RN why, he states "I don't know why I'm groaning, I'm not really hurting, I feel better." Patient c/o L calf cramping, tender to touch, no redness or edema noted. Upon EMS arrival, pt hypotensive off and on - 113/67 sitting, 94/43 standing. Pt given 200cc NS 22g. L hand PIV, BP increased to 130/74. Lung sounds clear, no wheezing heard. Pt was given 50mg  PO benadryl and 125mg  solumedrol PTA. EMS VS: P 80 strong and regular, NSR, 100% RA, CBG 223. Patient is A&O x 4. He is unable to give clear timeline as to what exactly happened, adding that at some point, he passed out. Unable to provide more details. PERRL.

## 2017-04-29 DIAGNOSIS — R55 Syncope and collapse: Secondary | ICD-10-CM | POA: Diagnosis not present

## 2017-04-29 DIAGNOSIS — I208 Other forms of angina pectoris: Secondary | ICD-10-CM

## 2017-04-29 DIAGNOSIS — I214 Non-ST elevation (NSTEMI) myocardial infarction: Secondary | ICD-10-CM | POA: Diagnosis not present

## 2017-04-29 DIAGNOSIS — Z91038 Other insect allergy status: Secondary | ICD-10-CM | POA: Diagnosis not present

## 2017-04-29 DIAGNOSIS — T782XXA Anaphylactic shock, unspecified, initial encounter: Secondary | ICD-10-CM | POA: Diagnosis not present

## 2017-04-29 DIAGNOSIS — R079 Chest pain, unspecified: Secondary | ICD-10-CM

## 2017-04-29 DIAGNOSIS — R748 Abnormal levels of other serum enzymes: Secondary | ICD-10-CM

## 2017-04-29 LAB — PHOSPHORUS: Phosphorus: 2.3 mg/dL — ABNORMAL LOW (ref 2.5–4.6)

## 2017-04-29 LAB — BASIC METABOLIC PANEL
Anion gap: 8 (ref 5–15)
BUN: 11 mg/dL (ref 6–20)
CO2: 22 mmol/L (ref 22–32)
Calcium: 8.8 mg/dL — ABNORMAL LOW (ref 8.9–10.3)
Chloride: 106 mmol/L (ref 101–111)
Creatinine, Ser: 1.33 mg/dL — ABNORMAL HIGH (ref 0.61–1.24)
GFR calc Af Amer: >60 mL/min (ref >60)
GFR calc non Af Amer: 55 mL/min — ABNORMAL LOW (ref >60)
Glucose, Bld: 138 mg/dL — ABNORMAL HIGH (ref 65–99)
Potassium: 4.5 mmol/L (ref 3.5–5.1)
Sodium: 136 mmol/L (ref 135–145)

## 2017-04-29 LAB — CBC
HCT: 40.7 % (ref 39.0–52.0)
HCT: 38.8 % — ABNORMAL LOW (ref 39.0–52.0)
Hemoglobin: 13.9 g/dL (ref 13.0–17.0)
Hemoglobin: 13.2 g/dL (ref 13.0–17.0)
MCH: 30.9 pg (ref 26.0–34.0)
MCH: 31.5 pg (ref 26.0–34.0)
MCHC: 34.2 g/dL (ref 30.0–36.0)
MCHC: 34 g/dL (ref 30.0–36.0)
MCV: 90.4 fL (ref 78.0–100.0)
MCV: 92.6 fL (ref 78.0–100.0)
Platelets: 205 10*3/uL (ref 150–400)
Platelets: 205 10*3/uL (ref 150–400)
RBC: 4.5 MIL/uL (ref 4.22–5.81)
RBC: 4.19 MIL/uL — ABNORMAL LOW (ref 4.22–5.81)
RDW: 12.6 % (ref 11.5–15.5)
RDW: 12.8 % (ref 11.5–15.5)
WBC: 14.7 10*3/uL — ABNORMAL HIGH (ref 4.0–10.5)
WBC: 16.7 10*3/uL — ABNORMAL HIGH (ref 4.0–10.5)

## 2017-04-29 LAB — TROPONIN I
Troponin I: 0.17 ng/mL (ref <0.03)
Troponin I: 0.08 ng/mL (ref <0.03)
Troponin I: 0.06 ng/mL (ref <0.03)

## 2017-04-29 LAB — CBC WITH DIFFERENTIAL/PLATELET
Basophils Absolute: 0 10*3/uL (ref 0.0–0.1)
Basophils Relative: 0 %
Eosinophils Absolute: 0 10*3/uL (ref 0.0–0.7)
Eosinophils Relative: 0 %
HCT: 42.4 % (ref 39.0–52.0)
Hemoglobin: 14.6 g/dL (ref 13.0–17.0)
Lymphocytes Relative: 7 %
Lymphs Abs: 1.4 10*3/uL (ref 0.7–4.0)
MCH: 30.9 pg (ref 26.0–34.0)
MCHC: 34.4 g/dL (ref 30.0–36.0)
MCV: 89.8 fL (ref 78.0–100.0)
Monocytes Absolute: 1.8 10*3/uL — ABNORMAL HIGH (ref 0.1–1.0)
Monocytes Relative: 9 %
Neutro Abs: 16.5 10*3/uL — ABNORMAL HIGH (ref 1.7–7.7)
Neutrophils Relative %: 84 %
Platelets: 96 10*3/uL — ABNORMAL LOW (ref 150–400)
RBC: 4.72 MIL/uL (ref 4.22–5.81)
RDW: 12.5 % (ref 11.5–15.5)
WBC: 19.7 10*3/uL — ABNORMAL HIGH (ref 4.0–10.5)

## 2017-04-29 LAB — HEPARIN LEVEL (UNFRACTIONATED)
Heparin Unfractionated: 0.72 IU/mL — ABNORMAL HIGH (ref 0.30–0.70)
Heparin Unfractionated: 0.65 IU/mL (ref 0.30–0.70)

## 2017-04-29 LAB — MAGNESIUM: Magnesium: 1.8 mg/dL (ref 1.7–2.4)

## 2017-04-29 LAB — HIV ANTIBODY (ROUTINE TESTING W REFLEX): HIV Screen 4th Generation wRfx: NONREACTIVE

## 2017-04-29 MED ORDER — ONDANSETRON HCL 4 MG/2ML IJ SOLN: 4.0000 mg | Freq: Four times a day (QID) | INTRAMUSCULAR | Status: DC | PRN

## 2017-04-29 MED ORDER — METHYLPREDNISOLONE SODIUM SUCC 40 MG IJ SOLR
20.0000 mg | Freq: Two times a day (BID) | INTRAMUSCULAR | Status: DC
  Administered 2017-04-29 – 2017-05-01 (×5): 20 mg via INTRAVENOUS
  Filled 2017-04-29 (×5): qty 1

## 2017-04-29 MED ORDER — ONDANSETRON HCL 4 MG PO TABS: 4.0000 mg | ORAL_TABLET | Freq: Four times a day (QID) | ORAL | Status: DC | PRN

## 2017-04-29 MED ORDER — ROSUVASTATIN CALCIUM 10 MG PO TABS
5.0000 mg | ORAL_TABLET | Freq: Every day | ORAL | Status: DC
  Administered 2017-04-29 – 2017-05-01 (×3): 5 mg via ORAL
  Filled 2017-04-29 (×3): qty 1

## 2017-04-29 MED ORDER — POLYETHYLENE GLYCOL 3350 17 G PO PACK: 17.0000 g | PACK | Freq: Every day | ORAL | Status: DC | PRN

## 2017-04-29 MED ORDER — ACETAMINOPHEN 325 MG PO TABS: 650.0000 mg | ORAL_TABLET | Freq: Four times a day (QID) | ORAL | Status: DC | PRN

## 2017-04-29 MED ORDER — TRAZODONE HCL 50 MG PO TABS
50.0000 mg | ORAL_TABLET | Freq: Every day | ORAL | Status: DC
  Administered 2017-04-29: 100 mg via ORAL
  Filled 2017-04-29: qty 2

## 2017-04-29 MED ORDER — IOPAMIDOL (ISOVUE-370) INJECTION 76%
INTRAVENOUS | Status: AC
  Filled 2017-04-29: qty 100

## 2017-04-29 MED ORDER — DIPHENHYDRAMINE HCL 50 MG/ML IJ SOLN
25.0000 mg | Freq: Three times a day (TID) | INTRAMUSCULAR | Status: AC
  Administered 2017-04-29 (×2): 25 mg via INTRAVENOUS
  Filled 2017-04-29 (×2): qty 1

## 2017-04-29 MED ORDER — ENOXAPARIN SODIUM 40 MG/0.4ML ~~LOC~~ SOLN: 40.0000 mg | SUBCUTANEOUS | Status: DC

## 2017-04-29 MED ORDER — FAMOTIDINE IN NACL 20-0.9 MG/50ML-% IV SOLN
20.0000 mg | Freq: Two times a day (BID) | INTRAVENOUS | Status: AC
  Administered 2017-04-29 (×2): 20 mg via INTRAVENOUS
  Filled 2017-04-29 (×2): qty 50

## 2017-04-29 MED ORDER — ACETAMINOPHEN 650 MG RE SUPP: 650.0000 mg | Freq: Four times a day (QID) | RECTAL | Status: DC | PRN

## 2017-04-29 MED ORDER — SODIUM CHLORIDE 0.45 % IV SOLN
INTRAVENOUS | Status: DC
  Administered 2017-04-29: 11:00:00 via INTRAVENOUS
  Administered 2017-04-29: 1000 mL via INTRAVENOUS

## 2017-04-29 NOTE — ED Notes (Signed)
IV attempted x's 2 without success 

## 2017-04-29 NOTE — Progress Notes (Signed)
Progress Note  Patient Name: Travis Jenkins Date of Encounter: 04/29/2017  Primary Cardiologist: Dr. Bronson Ing  Subjective   Currently the patient is chest pain-free. No shortness of breath, orthopnea, or edema.  Inpatient Medications    Scheduled Meds: . diphenhydrAMINE  25 mg Intravenous Q8H  . methylPREDNISolone (SOLU-MEDROL) injection  20 mg Intravenous Q12H  . rosuvastatin  5 mg Oral Daily  . traZODone  50-100 mg Oral QHS   Continuous Infusions: . sodium chloride 125 mL/hr at 04/29/17 1125  . famotidine (PEPCID) IV Stopped (04/29/17 1155)  . heparin 1,200 Units/hr (04/29/17 0900)   PRN Meds: acetaminophen **OR** acetaminophen, ondansetron **OR** ondansetron (ZOFRAN) IV, polyethylene glycol   Vital Signs    Vitals:   04/29/17 1200 04/29/17 1300 04/29/17 1400 04/29/17 1500  BP: 107/60 126/71 121/63 113/64  Pulse:  62 62 62  Resp:  12 18 19   Temp:      TempSrc:      SpO2:  95% 94% 92%  Weight:      Height:        Intake/Output Summary (Last 24 hours) at 04/29/17 1553 Last data filed at 04/29/17 1155  Gross per 24 hour  Intake              300 ml  Output                0 ml  Net              300 ml   Filed Weights   04/28/17 2102  Weight: 225 lb (102.1 kg)    Telemetry    Sinus rhythm with rates in the 50s to 60s - Personally Reviewed  ECG     Normal sinus rhythm at 76 bpm with nonspecific ST changes- Personally Reviewed  Physical Exam   GEN: No acute distress.   Neck: No JVD Cardiac: RRR, no murmurs, rubs, or gallops.  Respiratory: Clear to auscultation bilaterally. GI: Soft, nontender, non-distended  MS: No edema; No deformity. Neuro:  Nonfocal  Psych: Normal affect   Labs    Chemistry Recent Labs Lab 04/28/17 2323 04/29/17 0845  NA 142 136  K 3.9 4.5  CL 104 106  CO2  --  22  GLUCOSE 151* 138*  BUN 16 11  CREATININE 1.30* 1.33*  CALCIUM  --  8.8*  GFRNONAA  --  55*  GFRAA  --  >60  ANIONGAP  --  8      Hematology Recent Labs Lab 04/28/17 2230 04/28/17 2323 04/29/17 0719  WBC 19.7*  --  14.7*  RBC 4.72  --  4.50  HGB 14.6 13.3 13.9  HCT 42.4 39.0 40.7  MCV 89.8  --  90.4  MCH 30.9  --  30.9  MCHC 34.4  --  34.2  RDW 12.5  --  12.6  PLT 96*  --  205    Cardiac Enzymes Recent Labs Lab 04/29/17 0845  TROPONINI 0.17*    Recent Labs Lab 04/28/17 2322  TROPIPOC 0.34*     BNPNo results for input(s): BNP, PROBNP in the last 168 hours.   DDimer No results for input(s): DDIMER in the last 168 hours.   Radiology    Dg Chest Port 1 View  Result Date: 04/28/2017 CLINICAL DATA:  Initial evaluation for acute allergic reaction. EXAM: PORTABLE CHEST 1 VIEW COMPARISON:  None available. FINDINGS: Transverse heart size at the upper limits of normal. Mediastinal silhouette normal. Lungs mildly hypoinflated. No focal infiltrates. No  pulmonary edema or pleural effusion. No pneumothorax. No acute osseous abnormality. Degenerative changes noted within the visualized spine. IMPRESSION: No active cardiopulmonary disease. Electronically Signed   By: Jeannine Boga M.D.   On: 04/28/2017 22:24    Cardiac Studies   Echocardiogram 02/24/2014 Study Conclusions  - Left ventricle: The cavity size was normal. Systolic function was normal. The estimated ejection fraction was in the range of 50% to 55%. EF utilizing bi-plane strain method 60.9%. Wall motion was normal; there were no regional wall motion abnormalities. Left ventricular diastolic function parameters were normal. Mild to moderate concentric left ventricular hypertrophy. - Mitral valve: There was mild regurgitation. - Left atrium: The atrium was mildly dilated.  Patient Profile     66 y.o. male with history of hyperlipidemia who presented to the hospital with an anaphylactic reaction after being stung 4-5 times by yellow jackets. He has no history of MI. We were asked to consult for elevated troponin of  0.34.  Assessment & Plan    Chest pain and elevated troponin -In setting of anaphylactic episode after yellow jacket stings with severe dizziness, nausea, upper abdomen/low chest pressure, difficulty breathing, and eventual loss of consciousness. The patient was given Benadryl and Solu-Medrol by EMS and then epinephrine in the ED. -Troponins were 0.34 and 0.17 -EKG is without ischemic changes -Patient had a normal stress test and echocardiogram in 2015 -He has had complaints of some atypical left-sided chest pain that occurs at rest and is not associated with any shortness of breath, lightheadedness or nausea for the past several weeks. He was seen by his PCP who recommended cardiac testing but the patient did not feel that he wanted to go through testing at the time. He now wonders if this anaphylaxis stirred up a cardiac issue. -CVD risk factors include hyperlipidemia and family history with his father having had CAD as well as valve replacement -Will try to arrange for coronary CTA  Hyperlipidemia -Treated with Crestor 5 mg. No lipid levels noted in Epic. Managed by PCP.  For questions or updates, please contact [place] Please consult [email or web address] for contact info under Cardiology/STEMI.      Signed, Daune Perch, NP  04/29/2017, 3:53 PM    I have seen and examined the patient along with Daune Perch, NP.  I have reviewed the chart, notes and new data.  I agree with NP's note. He does have intermediate risk for CAD.  Key new complaints: his two syncope events could well be vasovagal, but they are causing a lot of concern to the patient and his family. His chest pain is atypical, preceded the syncope today by several weeks. Now pain free. Key examination changes: normal CV exam Key new findings / data: mild increase in troponin with rapid decline. No major ECH changes.  PLAN: Coronary CT angio. If abnormal, could proceed to cath tomorrow. If normal, I think we can  attribute the troponin increase to hypotension after the insect stings.  Sanda Klein, MD, [place] [phone] 04/29/2017, 4:36 PM

## 2017-04-29 NOTE — Consult Note (Signed)
Cardiology Consultation Note    Patient ID: Travis Jenkins, MRN: [id number], DOB/AGE: 03/23/1951 66 y.o. Admit date: 04/28/2017   Date of Consult: 04/29/2017 Primary Physician: Lujean Amel, MD Primary Cardiologist: Dr. Bronson Ing  Chief Complaint: Anaphylaxis Reason for Consultation: Positive troponin Requesting MD: Dr. Eulis Foster  HPI: Travis Jenkins is a 66 y.o. male who presents following an anaphylactic reaction.  He was attempting to clear a hive when he was stung by multiple yellow jackets.  He felt faint, had a sensation of abdominal discomfort, and then had frank syncope thereafter.  Upon EMS arrival, he was given Solu-Medrol and IV benadryl with improvement.  In the ED, VS upon presentation were WNL.  Labs were notable for positive troponin at 0.34, Cr. 1.3, but were otherwise unremarkable.  ECG showed no acute ischemic changes.  Pt did note some abdominal discomfort thereafter, and some mild chest tightness while in the ED.  Pt does note a history of intermittent SSCP, that has been happening primarily while at night and lying down.  It has no relationship to exertion.  Pt did have a negative nuclear MPI study 3 years prior to investigate a syncopal event.  Past Medical History:  Diagnosis Date  . IBS (irritable bowel syndrome)   . Raynaud's disease   . Syncope       Surgical History:  Past Surgical History:  Procedure Laterality Date  . ANKLE FRACTURE SURGERY    . ANTERIOR CRUCIATE LIGAMENT REPAIR  1990     Home Meds: Prior to Admission medications   Not on File    Inpatient Medications:   . heparin 1,400 Units/hr (04/29/17 0025)    Allergies:  Allergies  Allergen Reactions  . Erythromycin Rash  . Tetracyclines & Related Rash    Social History   Social History  . Marital status: Married    Spouse name: N/A  . Number of children: N/A  . Years of education: N/A   Occupational History  . Not on file.   Social History Main Topics  . Smoking status:  Former Smoker    Packs/day: 0.50    Years: 12.00    Types: Cigarettes    Start date: 06/20/1966    Quit date: 08/19/1971  . Smokeless tobacco: Never Used  . Alcohol use 2.4 oz/week    4 Cans of beer per week  . Drug use: No  . Sexual activity: Not on file   Other Topics Concern  . Not on file   Social History Narrative  . No narrative on file     Family History  Problem Relation Age of Onset  . Hyperlipidemia Father   . Diabetes Father   . Heart disease Father   . Diabetes Mother   . Parkinson's disease Mother   . Heart disease Mother      Review of Systems: General: negative for chills, fever, night sweats or weight changes.  Cardiovascular: negative for edema, orthopnea, palpitations, paroxysmal nocturnal dyspnea, shortness of breath or dyspnea on exertion Dermatological: negative for rash Respiratory: negative for cough or wheezing Urologic: negative for hematuria Abdominal: negative for nausea, vomiting, diarrhea, bright red blood per rectum, melena, or hematemesis Neurologic: negative for visual changes, syncope, or dizziness All other systems reviewed and are otherwise negative except as noted above.  Labs: No results for input(s): CKTOTAL, CKMB, TROPONINI in the last 72 hours. Lab Results  Component Value Date   WBC 19.7 (H) 04/28/2017   HGB 13.3 04/28/2017   HCT 39.0  04/28/2017   MCV 89.8 04/28/2017   PLT 96 (L) 04/28/2017    Recent Labs Lab 04/28/17 2323  NA 142  K 3.9  CL 104  BUN 16  CREATININE 1.30*  GLUCOSE 151*   No results found for: CHOL, HDL, LDLCALC, TRIG No results found for: DDIMER  Radiology/Studies:  Dg Chest Port 1 View  Result Date: 04/28/2017 CLINICAL DATA:  Initial evaluation for acute allergic reaction. EXAM: PORTABLE CHEST 1 VIEW COMPARISON:  None available. FINDINGS: Transverse heart size at the upper limits of normal. Mediastinal silhouette normal. Lungs mildly hypoinflated. No focal infiltrates. No pulmonary edema or pleural  effusion. No pneumothorax. No acute osseous abnormality. Degenerative changes noted within the visualized spine. IMPRESSION: No active cardiopulmonary disease. Electronically Signed   By: Jeannine Boga M.D.   On: 04/28/2017 22:24    Wt Readings from Last 3 Encounters:  04/28/17 102.1 kg (225 lb)  03/30/14 95.3 kg (210 lb)  02/15/14 90.7 kg (200 lb)    EKG: NSR, NS STTW changes.  Physical Exam: Blood pressure (!) 145/92, pulse 79, temperature 97.8 F (36.6 C), temperature source Oral, resp. rate 15, height 6\' 1"  (1.854 m), weight 102.1 kg (225 lb), SpO2 95 %. Body mass index is 29.69 kg/m. General: Well developed, well nourished, in no acute distress. Head: Normocephalic, atraumatic, sclera non-icteric, no xanthomas, nares are without discharge.  Neck: Negative for carotid bruits. JVD not elevated. Lungs: Clear bilaterally to auscultation without wheezes, rales, or rhonchi. Breathing is unlabored. Heart: RRR with S1 S2. No murmurs, rubs, or gallops appreciated. Abdomen: Soft, non-tender, non-distended with normoactive bowel sounds. No hepatomegaly. No rebound/guarding. No obvious abdominal masses. Msk:  Strength and tone appear normal for age. Extremities: No clubbing or cyanosis. No edema.  Distal pedal pulses are 2+ and equal bilaterally. Neuro: Alert and oriented X 3. No facial asymmetry. No focal deficit. Moves all extremities spontaneously. Psych:  Responds to questions appropriately with a normal affect.     Assessment and Plan  38M who presents with positive troponin I/s/o an anaphylactic event.  This likely represents type II MI 2/2 fixed coronary disease; pt is currently CP free upon my interview.  Would continue to cycle enzymes and monitor on telemetry.  Would not heparinize unless enzymes rapidly uptrending or if the patient were to have symptoms concerning for ischemia.  Depending on the enzyme trend, we could consider further work up while inpatient versus in the  outpatient setting.  Please keep NPO pending AM cardiology re-evaluation.  Given positive troponin, he has effectively failed a stress test, and if planning further work up, would favor an invasive approach.  Merrilee Seashore MD 04/29/2017, 12:40 AM

## 2017-04-29 NOTE — ED Notes (Signed)
Cardiology at bedside.

## 2017-04-29 NOTE — ED Notes (Signed)
Cards at the bedside

## 2017-04-29 NOTE — Progress Notes (Signed)
ANTICOAGULATION CONSULT NOTE - Follow Up Consult  Pharmacy Consult:  Heparin Indication: chest pain/ACS  Allergies  Allergen Reactions  . 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 Erythromycin Rash  . Tetracyclines & Related Rash    Patient Measurements: Height: 6\' 1"  (185.4 cm) Weight: 225 lb (102.1 kg) IBW/kg (Calculated) : 79.9 Heparin Dosing Weight: 100 kg  Vital Signs: Temp: 97.8 F (36.6 C) (09/11 2122) Temp Source: Oral (09/11 2122) BP: 125/77 (09/12 0600) Pulse Rate: 67 (09/12 0600)  Labs:  Recent Labs  04/28/17 2230 04/28/17 2323 04/29/17 0719  HGB 14.6 13.3 13.9  HCT 42.4 39.0 40.7  PLT 96*  --  205  HEPARINUNFRC  --   --  0.72*  CREATININE  --  1.30*  --     Estimated Creatinine Clearance: 72.1 mL/min (A) (by C-G formula based on SCr of 1.3 mg/dL (H)).   Medical History: Past Medical History:  Diagnosis Date  . IBS (irritable bowel syndrome)   . Raynaud's disease   . Syncope       Assessment: 61 YOM presented with an allergic reaction after being stung by yellowjackets.  Patient's troponin is elevated. Pharmacy currently managing IV heparin. Heparin level this AM was mildly supra-therapeutic. No bleeding noted per RN. H/H and Plt wnl. Cardiology to evaluate this AM.    Goal of Therapy:  Heparin level 0.3-0.7 units/ml Monitor platelets by anticoagulation protocol: Yes    Plan:  Decrease heparin gt at 1200 units/hr F/u 6 hr HL  F/u cardiology recs this AM Daily heparin level and CBC   Albertina Parr, PharmD., BCPS Clinical Pharmacist Pager [phone]

## 2017-04-29 NOTE — ED Notes (Signed)
MD at bedside at this time.

## 2017-04-29 NOTE — ED Notes (Signed)
Pt and family concerned about wait time in ER, were told cardiology would reevaluate in the morning. Cardiology paged, waiting to hear back.

## 2017-04-29 NOTE — Progress Notes (Signed)
ED staff called to let me know that they could not get a 18 gauge IV tonight for the CTA. I do not see a good quick solution to this problem. Cardiac CTA will be delayed until IV team can get 18 gauge IV in tonight or tomorrow.   Lauree Chandler 04/29/2017 5:50 PM

## 2017-04-29 NOTE — ED Notes (Signed)
Pt to remain NPO until reevaluation by cardiologist

## 2017-04-29 NOTE — ED Notes (Signed)
Spoke with Dr. Angelena Form via telephone and made him aware of not being able to obtain IV #18 access.  Verbal order received to transport pt to admission room

## 2017-04-29 NOTE — Progress Notes (Signed)
Patient was admitted early this AM after midnight and H and P has been reviewed and I am in agreement with the Assessment and Plan done by Dr. Roney Jaffe. Patient is a 66 yo Caucasian male who presented to the ED after multiple wasp stings after attempting to clear out a hive. He felt faint and lightheaded and passed out. He was brought in for Anaphylaxis and treated with IV Steroids, Benadryl and Famotidine along with Epinephrine. He had some mild chest discomfort and elevated troponin so Cardiology was consulted and because of patient's intermediate risk will undergo a Coronary CT Angio. If it is abnormal patient could proceed to Cardiac Catheterization in AM. If patient has a normal Coronary CT Angio, elevated troponin could be attributed due to Hypotension from Anaphylaxis after insect stings.

## 2017-04-29 NOTE — ED Notes (Signed)
IV team paged for IV access of #18 for CTA

## 2017-04-29 NOTE — H&P (Signed)
[facility] History and Physical  SHEIKH LEVERICH PHX:[id number] DOB: 05-11-1951 DOA: 04/28/2017  Referring physician: Dr Eulis Foster, ED MD PCP: Lujean Amel, MD   Chief Complaint: Anaphylaxis,   HPI: Travis Jenkins is a 66 y.o. male mostly healthy presented to ED via EMS after receiving multiple wasp stings this afternoon in his backyard.  He was attempting to clear a hive when he was stung by multiple yellow jackets.  He felt faint, then had some abdominal cramping and nausea, then got very lightheaded and then passed out.  He was able to get up and call family , who then called 911.  Treated w/ IV steroids and benadryl per EMS.  In ED on presentation, BP was ok 139/ 95.  Labs showed trop up at 0.34, creat 1.3, otherwise wnl.  EKG was normal. He was treated with IM epi 0.3mg , IV Pepcid, 1 L NS bolus, asa chewable and zofran.  He did have some abd discomfort in the ED which has now resolved. Also then had some mild chest pain, for which cardiology is seeing.  He was started on IV heparin.  We are asked to see for admission.   Patient has no chronic medical conditions other than Raynaud's phenomenon and HL.  Takes Crestor and was recently started on trazodone for sleep .    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    ROS  no joint pain   no HA  no blurry vision  no rash  no diarrhea  no dysuria  no difficulty voiding  no change in urine color    Past Medical History  Past Medical History:  Diagnosis Date  . IBS (irritable bowel syndrome)   . Raynaud's disease   . Syncope    Past Surgical History  Past Surgical History:  Procedure Laterality Date  . ANKLE FRACTURE SURGERY    . ANTERIOR CRUCIATE LIGAMENT REPAIR  1990   Family History  Family History  Problem Relation Age of Onset  . Hyperlipidemia Father   . Diabetes Father   .  Heart disease Father   . Diabetes Mother   . Parkinson's disease Mother   . Heart disease Mother    Social History  reports that he quit smoking about 45 years ago. His smoking use included Cigarettes. He started smoking about 50 years ago. He has a 6.00 pack-year smoking history. He has never used smokeless tobacco. He reports that he drinks about 2.4 oz of alcohol per week . He reports that he does not use drugs. Allergies  Allergies  Allergen Reactions  . Erythromycin Rash  . Tetracyclines & Related Rash   Home medications Prior to Admission medications   Medication Sig Start Date End Date Taking? Authorizing Provider  rosuvastatin (CRESTOR) 5 MG tablet Take 5 mg by mouth daily. 04/09/17   [provider]  traZODone (DESYREL) 100 MG tablet Take 50-100 mg by mouth at bedtime. 04/10/17   [provider]   Liver Function Tests No results for input(s): AST, ALT, ALKPHOS, BILITOT, PROT, ALBUMIN in the last 168 hours. No results for input(s): LIPASE, AMYLASE in the last 168 hours. CBC  Recent Labs Lab 04/28/17 2230 04/28/17 2323  WBC 19.7*  --   NEUTROABS 16.5*  --   HGB 14.6 13.3  HCT 42.4 39.0  MCV 89.8  --  PLT 96*  --    Basic Metabolic Panel  Recent Labs Lab 04/28/17 2323  NA 142  K 3.9  CL 104  GLUCOSE 151*  BUN 16  CREATININE 1.30*     Vitals:   04/28/17 2315 04/29/17 0015 04/29/17 0045 04/29/17 0100  BP: 131/77 (!) 145/92 (!) 143/91 (!) 146/90  Pulse: 87 79 71 69  Resp: 17 15 19 17   Temp:      TempSrc:      SpO2: 100% 95% 96% 94%  Weight:      Height:       Exam: Constitutional: He is oriented to person, place, and time. He appears well-developed and well-nourished. NO distress HENT: PERRLA Head: Normocephalic and atraumatic.  Right Ear: External ear normal.  Left Ear: External ear normal.  Eyes: Pupils are equal, round, and reactive to light. Conjunctivae and EOM are normal.  Neck: Normal range of motion and phonation normal.  Neck supple.  Cardiovascular: Normal rate, regular rhythm and normal heart sounds.   Pulmonary/Chest: Effort normal and breath sounds normal. No respiratory distress. He has no wheezes. He exhibits no bony tenderness.  Abdominal: Soft. He exhibits no distension. There is no tenderness.  Musculoskeletal: Normal range of motion.  Neurological: He is alert and oriented to person, place, and time. No cranial nerve deficit or sensory deficit. He exhibits normal muscle tone. Coordination wnl.  Skin: Skin is warm, dry and intact. Scattered areas of red raised tissue, consistent with wasp sting, left upper back and left arm.    Psychiatric: He has a normal mood and affect. His behavior is normal. Judgment and thought content normal.  Nursing note and vitals reviewed.  Home meds: -crestor/ trazodone  Na 142  cR 1.30   BUN 16  wBC 19k  Hb 14.6  plt 96   EKG (independ reviewed) > NSR 80's, no ischemic changes CXR (independ reviewed) > no active cardiopulmonary disease    Assessment: 1.  Anaphylaxis w syncope - after multiple yellow jacket sting.  Improved now after steroids/ benadryl in the field, and IM epi, IV Pepcid w NS bolus here. BP's stable, no wheezing.   Will cont IVF's, H2 blockers/ benadryl, steroids for now. 2.  Chest tightness/ Cleora Fleet - seen by cardiology, now on IV heparin 3.  Hx Raynauds' disease     Plan - as above      [place] D [facility] Pager [phone]   If 7PM-7AM, please contact night-coverage [email or web address] Password [email or web address] 04/29/2017, 1:48 AM

## 2017-04-29 NOTE — Progress Notes (Signed)
ANTICOAGULATION CONSULT NOTE - Follow Up Consult  Pharmacy Consult:  Heparin Indication: chest pain/ACS  Allergies  Allergen Reactions  . 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 Erythromycin Rash  . Tetracyclines & Related Rash    Patient Measurements: Height: 6\' 1"  (185.4 cm) Weight: 225 lb (102.1 kg) IBW/kg (Calculated) : 79.9 Heparin Dosing Weight: 100 kg  Vital Signs: BP: 113/64 (09/12 1500) Pulse Rate: 62 (09/12 1500)  Labs:  Recent Labs  04/28/17 2230 04/28/17 2323 04/29/17 0719 04/29/17 0845 04/29/17 1437  HGB 14.6 13.3 13.9  --   --   HCT 42.4 39.0 40.7  --   --   PLT 96*  --  205  --   --   HEPARINUNFRC  --   --  0.72*  --  0.65  CREATININE  --  1.30*  --  1.33*  --   TROPONINI  --   --   --  0.17*  --     Estimated Creatinine Clearance: 70.5 mL/min (A) (by C-G formula based on SCr of 1.33 mg/dL (H)).   Medical History: Past Medical History:  Diagnosis Date  . IBS (irritable bowel syndrome)   . Raynaud's disease   . Syncope       Assessment: 16 YOM presented with an allergic reaction after being stung by yellowjackets. Patient's troponin is elevated. Pharmacy currently managing IV heparin. Heparin level is therapeutic after most recent rate decrease. No bleeding noted per RN. H/H and Plt wnl. Cardiology to evaluate today.   Goal of Therapy:  Heparin level 0.3-0.7 units/ml Monitor platelets by anticoagulation protocol: Yes    Plan:  1. Continue heparin gtt at 1200 units/hr 2. Daily heparin level and CBC 3. F/u cardiology recs today   Vincenza Hews, PharmD, BCPS 04/29/2017, 3:30 PM

## 2017-04-30 ENCOUNTER — Observation Stay (HOSPITAL_COMMUNITY): Payer: BLUE CROSS/BLUE SHIELD

## 2017-04-30 DIAGNOSIS — I214 Non-ST elevation (NSTEMI) myocardial infarction: Secondary | ICD-10-CM | POA: Diagnosis not present

## 2017-04-30 DIAGNOSIS — Z8249 Family history of ischemic heart disease and other diseases of the circulatory system: Secondary | ICD-10-CM | POA: Diagnosis not present

## 2017-04-30 DIAGNOSIS — Z8349 Family history of other endocrine, nutritional and metabolic diseases: Secondary | ICD-10-CM | POA: Diagnosis not present

## 2017-04-30 DIAGNOSIS — D72829 Elevated white blood cell count, unspecified: Secondary | ICD-10-CM

## 2017-04-30 DIAGNOSIS — T380X5A Adverse effect of glucocorticoids and synthetic analogues, initial encounter: Secondary | ICD-10-CM | POA: Diagnosis not present

## 2017-04-30 DIAGNOSIS — N182 Chronic kidney disease, stage 2 (mild): Secondary | ICD-10-CM

## 2017-04-30 DIAGNOSIS — R55 Syncope and collapse: Secondary | ICD-10-CM

## 2017-04-30 DIAGNOSIS — E7800 Pure hypercholesterolemia, unspecified: Secondary | ICD-10-CM

## 2017-04-30 DIAGNOSIS — I21A1 Myocardial infarction type 2: Secondary | ICD-10-CM | POA: Diagnosis present

## 2017-04-30 DIAGNOSIS — Z87891 Personal history of nicotine dependence: Secondary | ICD-10-CM | POA: Diagnosis not present

## 2017-04-30 DIAGNOSIS — I2510 Atherosclerotic heart disease of native coronary artery without angina pectoris: Secondary | ICD-10-CM

## 2017-04-30 DIAGNOSIS — I208 Other forms of angina pectoris: Secondary | ICD-10-CM | POA: Diagnosis not present

## 2017-04-30 DIAGNOSIS — Z955 Presence of coronary angioplasty implant and graft: Secondary | ICD-10-CM

## 2017-04-30 DIAGNOSIS — I25110 Atherosclerotic heart disease of native coronary artery with unstable angina pectoris: Secondary | ICD-10-CM | POA: Diagnosis not present

## 2017-04-30 DIAGNOSIS — E785 Hyperlipidemia, unspecified: Secondary | ICD-10-CM

## 2017-04-30 DIAGNOSIS — I2583 Coronary atherosclerosis due to lipid rich plaque: Secondary | ICD-10-CM | POA: Diagnosis not present

## 2017-04-30 DIAGNOSIS — R778 Other specified abnormalities of plasma proteins: Secondary | ICD-10-CM

## 2017-04-30 DIAGNOSIS — T63461A Toxic effect of venom of wasps, accidental (unintentional), initial encounter: Secondary | ICD-10-CM | POA: Diagnosis present

## 2017-04-30 DIAGNOSIS — R748 Abnormal levels of other serum enzymes: Secondary | ICD-10-CM

## 2017-04-30 DIAGNOSIS — I7300 Raynaud's syndrome without gangrene: Secondary | ICD-10-CM | POA: Diagnosis present

## 2017-04-30 DIAGNOSIS — Z881 Allergy status to other antibiotic agents status: Secondary | ICD-10-CM | POA: Diagnosis not present

## 2017-04-30 DIAGNOSIS — R7989 Other specified abnormal findings of blood chemistry: Secondary | ICD-10-CM

## 2017-04-30 DIAGNOSIS — T782XXA Anaphylactic shock, unspecified, initial encounter: Secondary | ICD-10-CM | POA: Diagnosis present

## 2017-04-30 DIAGNOSIS — Z91038 Other insect allergy status: Secondary | ICD-10-CM | POA: Diagnosis not present

## 2017-04-30 DIAGNOSIS — I25119 Atherosclerotic heart disease of native coronary artery with unspecified angina pectoris: Secondary | ICD-10-CM | POA: Diagnosis present

## 2017-04-30 LAB — CBC WITH DIFFERENTIAL/PLATELET
Basophils Absolute: 0 10*3/uL (ref 0.0–0.1)
Basophils Relative: 0 %
Eosinophils Absolute: 0 10*3/uL (ref 0.0–0.7)
Eosinophils Relative: 0 %
HCT: 38.3 % — ABNORMAL LOW (ref 39.0–52.0)
Hemoglobin: 12.6 g/dL — ABNORMAL LOW (ref 13.0–17.0)
Lymphocytes Relative: 5 %
Lymphs Abs: 0.7 10*3/uL (ref 0.7–4.0)
MCH: 30.5 pg (ref 26.0–34.0)
MCHC: 32.9 g/dL (ref 30.0–36.0)
MCV: 92.7 fL (ref 78.0–100.0)
Monocytes Absolute: 0.6 10*3/uL (ref 0.1–1.0)
Monocytes Relative: 4 %
Neutro Abs: 13.3 10*3/uL — ABNORMAL HIGH (ref 1.7–7.7)
Neutrophils Relative %: 91 %
Platelets: 166 10*3/uL (ref 150–400)
RBC: 4.13 MIL/uL — ABNORMAL LOW (ref 4.22–5.81)
RDW: 12.9 % (ref 11.5–15.5)
WBC: 14.6 10*3/uL — ABNORMAL HIGH (ref 4.0–10.5)

## 2017-04-30 LAB — COMPREHENSIVE METABOLIC PANEL
ALT: 15 U/L — ABNORMAL LOW (ref 17–63)
AST: 35 U/L (ref 15–41)
Albumin: 3.7 g/dL (ref 3.5–5.0)
Alkaline Phosphatase: 41 U/L (ref 38–126)
Anion gap: 4 — ABNORMAL LOW (ref 5–15)
BUN: 18 mg/dL (ref 6–20)
CO2: 25 mmol/L (ref 22–32)
Calcium: 8.6 mg/dL — ABNORMAL LOW (ref 8.9–10.3)
Chloride: 108 mmol/L (ref 101–111)
Creatinine, Ser: 1.21 mg/dL (ref 0.61–1.24)
GFR calc Af Amer: >60 mL/min (ref >60)
GFR calc non Af Amer: >60 mL/min (ref >60)
Glucose, Bld: 162 mg/dL — ABNORMAL HIGH (ref 65–99)
Potassium: 4.7 mmol/L (ref 3.5–5.1)
Sodium: 137 mmol/L (ref 135–145)
Total Bilirubin: 0.5 mg/dL (ref 0.3–1.2)
Total Protein: 6.1 g/dL — ABNORMAL LOW (ref 6.5–8.1)

## 2017-04-30 LAB — HEPARIN LEVEL (UNFRACTIONATED): Heparin Unfractionated: 0.46 IU/mL (ref 0.30–0.70)

## 2017-04-30 LAB — PHOSPHORUS: Phosphorus: 2.4 mg/dL — ABNORMAL LOW (ref 2.5–4.6)

## 2017-04-30 LAB — MAGNESIUM: Magnesium: 2.2 mg/dL (ref 1.7–2.4)

## 2017-04-30 IMAGING — CT CT CORONARY FRACTIONAL FLOW RESERVE
4 of 7 series · 9 of 20 positions shown, 10 images · IV contrast (APPLIED)
Comparison: None.

CLINICAL DATA: 65 -year-old male with troponin elevation in the
settings of anaphylactic shock.

EXAM:
Cardiac/Coronary  CT
TECHNIQUE: The patient was scanned on a [employer] Force scanner.

[Series 6: best diast 71 % · axial · 0.42mm/px · z∈[-131,-37]mm · 3 of 468 slices shown, 4 images]
[im 117/468  vessel]
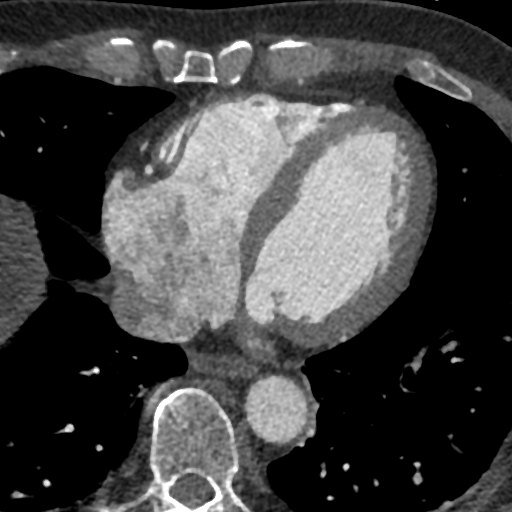
[im 117/468  lung]
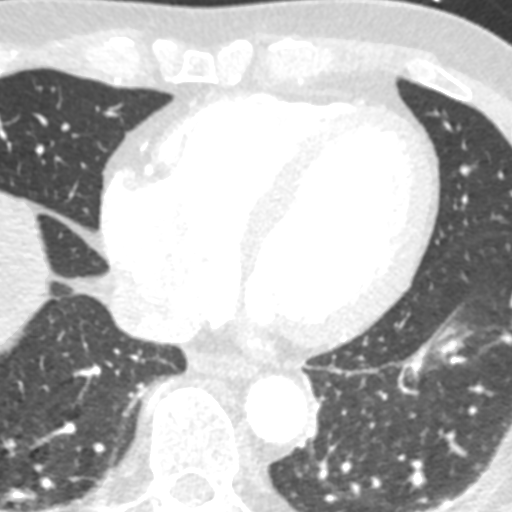
[im 234/468  vessel]
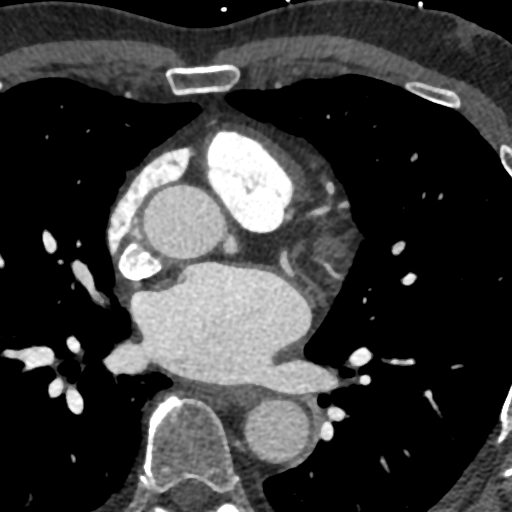
[im 351/468  vessel]
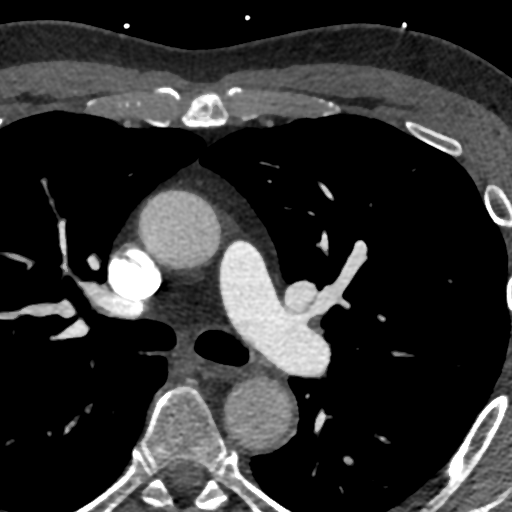

[Series 7: best syst 43 % · axial · 0.42mm/px · z∈[-115,-53]mm · 2 of 468 slices shown]
[im 156/468  vessel]
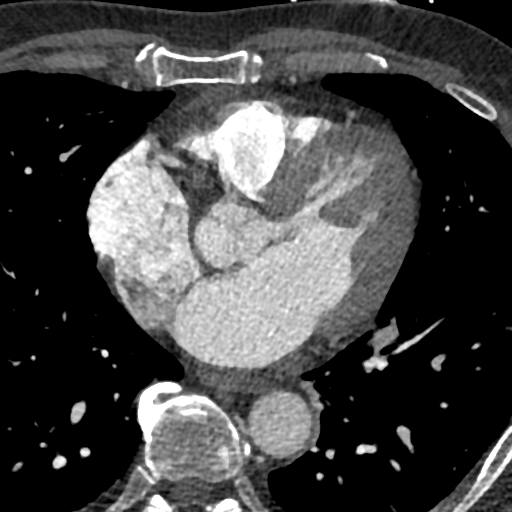
[im 312/468  vessel]
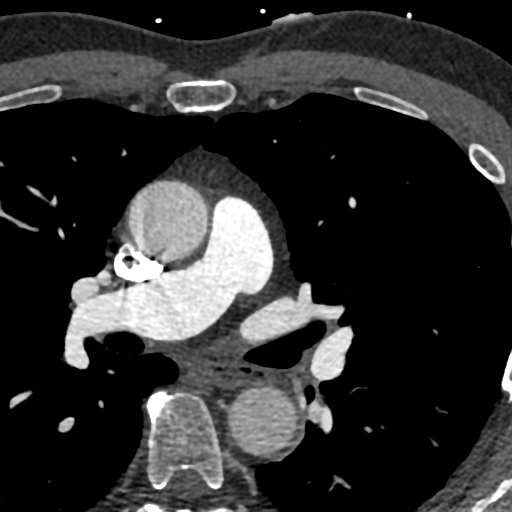

[Series 8: ts diast sharp 71 % · axial · 0.42mm/px · z∈[-115,-53]mm · 2 of 468 slices shown]
[im 156/468  lung]
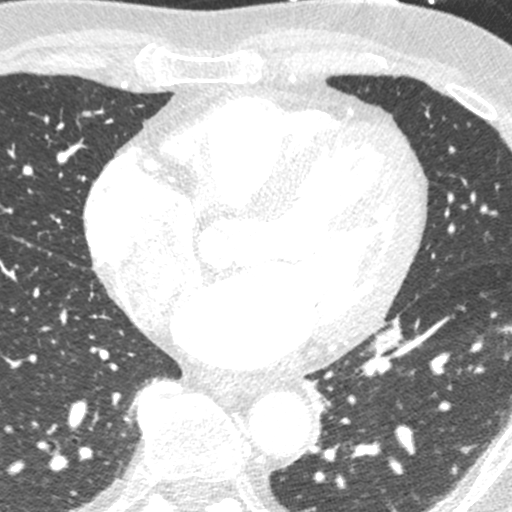
[im 312/468  lung]
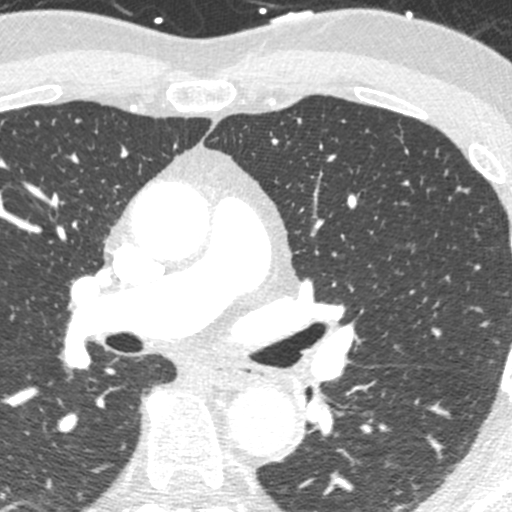

[Series 9: ts syst sharp 43 % · axial · 0.42mm/px · z∈[-115,-53]mm · 2 of 468 slices shown]
[im 156/468  lung]
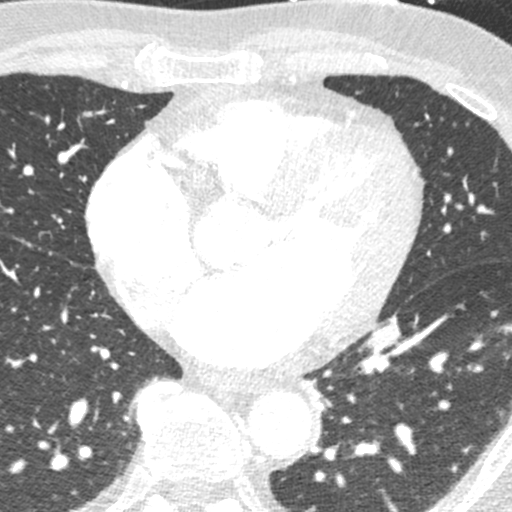
[im 312/468  lung]
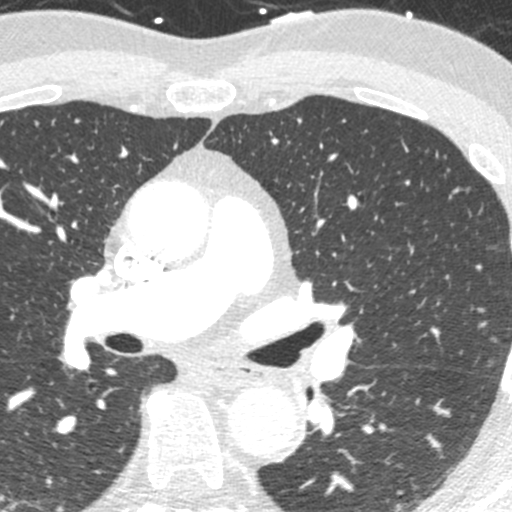

[9 of 20 positions shown; findings below may reference images not displayed]



Aorta:  Normal size.  No calcifications.  No dissection.

Aortic Valve:  Trileaflet.  No calcifications.

Coronary Arteries:  Normal coronary origin.  Right dominance.

RCA is a large dominant artery that gives rise to PDA and PLVB.
There is no plaque.

Left main is a very large artery that gives rise to LAD, ramus
intermedius and LCX arteries.

LAD is a large vessel that gives rise to one large diagonal branch.
There is minimal eccentric calcified plaque in the proximal LAD with
associated stenosis 0-25%. Proximal to mid LAD has moderate
noncalcified plaque just prior to the D1 takeoff with associated
stenosis 50-69%.

RI is a small artery that has no plaque.

LCX is a non-dominant artery that gives rise to one OM1 branch.
There is no plaque.

Other findings:

Normal pulmonary vein drainage into the left atrium.

Normal let atrial appendage without a thrombus.

Normal size of the pulmonary artery.
IMPRESSION: 1. Normal coronary origin with right dominance.

2. There is a moderate noncalcified plaque in the proximal to mid
LAD, just prior to the D1 takeoff with associated stenosis 50-69%.
Additional analysis with CT FFR will be submitted.

Hun Keen C Ma

EXAM:
FF/RCT ANALYSIS
FINDINGS: FFRct analysis was performed on the original cardiac CT angiogram
dataset. Diagrammatic representation of the FFRct analysis is
provided in a separate PDF document in PACS. This dictation was
created using the PDF document and an interactive 3D model of the
results. 3D model is not available in the EMR/PACS. Normal FFR range
is >0.80.

1. Left Main:  No significant stenosis.

2. LAD: No significant stenosis in the proximal LAD. Mid LAD after
the takeoff of the first diagonal branch 0.78.
3. LCX: No significant stenosis.
4. RCA: No significant stenosis.
IMPRESSION: 1. CT FFR analysis showed hemodynamically significant stenosis in
the proximal to mid LAD.

EXAM:
OVER-READ INTERPRETATION  CT CHEST

The following report is an over-read performed by radiologist Dr.
over-read does not include interpretation of cardiac or coronary
anatomy or pathology. The coronary calcium score/coronary CTA
interpretation by the cardiologist is attached.
FINDINGS: Mild multifocal linear scarring in the lower lobes of the lungs
bilaterally. Within the visualized portions of the thorax there are
no suspicious appearing pulmonary nodules or masses, there is no
acute consolidative airspace disease, no pleural effusions, no
pneumothorax and no lymphadenopathy. Visualized portions of the
upper abdomen are unremarkable. There are no aggressive appearing
lytic or blastic lesions noted in the visualized portions of the
skeleton.
IMPRESSION: 1. No significant incidental noncardiac findings are noted.

## 2017-04-30 MED ORDER — FAMOTIDINE 20 MG PO TABS: 20.0000 mg | ORAL_TABLET | Freq: Two times a day (BID) | ORAL | 0 refills | Status: DC

## 2017-04-30 MED ORDER — SODIUM CHLORIDE 0.9 % IV SOLN: 250.0000 mL | INTRAVENOUS | Status: DC | PRN

## 2017-04-30 MED ORDER — NITROGLYCERIN 0.4 MG SL SUBL
SUBLINGUAL_TABLET | SUBLINGUAL | Status: AC
  Filled 2017-04-30: qty 1

## 2017-04-30 MED ORDER — DIPHENHYDRAMINE HCL 25 MG PO CAPS: 25.0000 mg | ORAL_CAPSULE | Freq: Four times a day (QID) | ORAL | 2 refills | Status: DC | PRN

## 2017-04-30 MED ORDER — EPINEPHRINE 0.3 MG/0.3ML IJ SOAJ: 0.3000 mg | Freq: Once | INTRAMUSCULAR | 0 refills | Status: AC

## 2017-04-30 MED ORDER — SODIUM CHLORIDE 0.9 % WEIGHT BASED INFUSION
1.0000 mL/kg/h | INTRAVENOUS | Status: DC
  Administered 2017-04-30 – 2017-05-01 (×2): 1 mL/kg/h via INTRAVENOUS

## 2017-04-30 MED ORDER — SODIUM CHLORIDE 0.9% FLUSH: 3.0000 mL | INTRAVENOUS | Status: DC | PRN

## 2017-04-30 MED ORDER — TRAZODONE HCL 50 MG PO TABS
100.0000 mg | ORAL_TABLET | Freq: Every day | ORAL | Status: DC
  Administered 2017-04-30 – 2017-05-01 (×2): 100 mg via ORAL
  Filled 2017-04-30: qty 1
  Filled 2017-04-30: qty 2

## 2017-04-30 MED ORDER — ASPIRIN 81 MG PO CHEW
81.0000 mg | CHEWABLE_TABLET | ORAL | Status: AC
  Administered 2017-05-01: 81 mg via ORAL
  Filled 2017-04-30: qty 1

## 2017-04-30 MED ORDER — PREDNISONE 10 MG (21) PO TBPK: ORAL_TABLET | ORAL | 0 refills | Status: DC

## 2017-04-30 MED ORDER — IOPAMIDOL (ISOVUE-370) INJECTION 76%
INTRAVENOUS | Status: AC
  Administered 2017-04-30: 80 mL
  Filled 2017-04-30: qty 100

## 2017-04-30 MED ORDER — SODIUM CHLORIDE 0.9% FLUSH
3.0000 mL | Freq: Two times a day (BID) | INTRAVENOUS | Status: DC
  Administered 2017-05-01: 3 mL via INTRAVENOUS

## 2017-04-30 NOTE — Progress Notes (Signed)
Progress Note  Patient Name: Travis Jenkins Date of Encounter: 04/30/2017  Primary Cardiologist: Bronson Ing  Subjective   Had some positional left chest pain during CT, but nothing that sounds anginal. Coronary CTA is concerning for proximal LAD stenosis at the first diagonal bifurcation.  Inpatient Medications    Scheduled Meds: . methylPREDNISolone (SOLU-MEDROL) injection  20 mg Intravenous Q12H  . nitroGLYCERIN      . rosuvastatin  5 mg Oral Daily  . traZODone  100 mg Oral QHS   Continuous Infusions: . sodium chloride 1,000 mL (04/29/17 1933)  . heparin 1,200 Units/hr (04/29/17 1705)   PRN Meds: acetaminophen **OR** acetaminophen, ondansetron **OR** ondansetron (ZOFRAN) IV, polyethylene glycol   Vital Signs    Vitals:   04/29/17 1600 04/29/17 2210 04/30/17 0546 04/30/17 0856  BP: 116/68 122/69 122/67 138/73  Pulse: (!) 58 67 63 (!) 53  Resp: 20 18 18 18   Temp:  97.8 F (36.6 C) 98.3 F (36.8 C) 97.7 F (36.5 C)  TempSrc:  Oral Oral Oral  SpO2: 93% 99% 96% 96%  Weight:      Height:        Intake/Output Summary (Last 24 hours) at 04/30/17 1824 Last data filed at 04/30/17 1700  Gross per 24 hour  Intake          4442.09 ml  Output             1700 ml  Net          2742.09 ml   Filed Weights   04/28/17 2102  Weight: 225 lb (102.1 kg)    Telemetry    NSR - Personally Reviewed  ECG    No new tracing - Personally Reviewed  Physical Exam  Calm, rleaxed GEN: No acute distress.   Neck: No JVD Cardiac: RRR, no murmurs, rubs, or gallops.  Respiratory: Clear to auscultation bilaterally. GI: Soft, nontender, non-distended  MS: No edema; No deformity. Neuro:  Nonfocal  Psych: Normal affect   Labs    Chemistry Recent Labs Lab 04/28/17 2323 04/29/17 0845 04/30/17 0332  NA 142 136 137  K 3.9 4.5 4.7  CL 104 106 108  CO2  --  22 25  GLUCOSE 151* 138* 162*  BUN 16 11 18   CREATININE 1.30* 1.33* 1.21  CALCIUM  --  8.8* 8.6*  PROT  --   --   6.1*  ALBUMIN  --   --  3.7  AST  --   --  35  ALT  --   --  15*  ALKPHOS  --   --  41  BILITOT  --   --  0.5  GFRNONAA  --  55* >60  GFRAA  --  >60 >60  ANIONGAP  --  8 4*     Hematology Recent Labs Lab 04/29/17 0719 04/29/17 2012 04/30/17 0332  WBC 14.7* 16.7* 14.6*  RBC 4.50 4.19* 4.13*  HGB 13.9 13.2 12.6*  HCT 40.7 38.8* 38.3*  MCV 90.4 92.6 92.7  MCH 30.9 31.5 30.5  MCHC 34.2 34.0 32.9  RDW 12.6 12.8 12.9  PLT 205 205 166    Cardiac Enzymes Recent Labs Lab 04/29/17 0845 04/29/17 1437 04/29/17 2012  TROPONINI 0.17* 0.08* 0.06*    Recent Labs Lab 04/28/17 2322  TROPIPOC 0.34*     BNPNo results for input(s): BNP, PROBNP in the last 168 hours.   DDimer No results for input(s): DDIMER in the last 168 hours.   Radiology  Ct Coronary Morph W/cta Cor W/score W/ca W/cm &/or Wo/cm  Result Date: 04/30/2017 EXAM: OVER-READ INTERPRETATION  CT CHEST The following report is an over-read performed by radiologist Dr. Rebekah Chesterfield [facility] on 04/30/2017. This over-read does not include interpretation of cardiac or coronary anatomy or pathology. The coronary calcium score/coronary CTA interpretation by the cardiologist is attached. COMPARISON:  None. FINDINGS: Mild multifocal linear scarring in the lower lobes of the lungs bilaterally. Within the visualized portions of the thorax there are no suspicious appearing pulmonary nodules or masses, there is no acute consolidative airspace disease, no pleural effusions, no pneumothorax and no lymphadenopathy. Visualized portions of the upper abdomen are unremarkable. There are no aggressive appearing lytic or blastic lesions noted in the visualized portions of the skeleton. IMPRESSION: 1. No significant incidental noncardiac findings are noted. Electronically Signed   By: Vinnie Langton M.D.   On: 04/30/2017 15:40   Dg Chest Port 1 View  Result Date: 04/28/2017 CLINICAL DATA:  Initial evaluation for acute  allergic reaction. EXAM: PORTABLE CHEST 1 VIEW COMPARISON:  None available. FINDINGS: Transverse heart size at the upper limits of normal. Mediastinal silhouette normal. Lungs mildly hypoinflated. No focal infiltrates. No pulmonary edema or pleural effusion. No pneumothorax. No acute osseous abnormality. Degenerative changes noted within the visualized spine. IMPRESSION: No active cardiopulmonary disease. Electronically Signed   By: Jeannine Boga M.D.   On: 04/28/2017 22:24   Ct Coronary Fractional Flow Reserve Data Prep  Result Date: 04/30/2017 EXAM: OVER-READ INTERPRETATION  CT CHEST The following report is an over-read performed by radiologist Dr. Rebekah Chesterfield [facility] on 04/30/2017. This over-read does not include interpretation of cardiac or coronary anatomy or pathology. The coronary calcium score/coronary CTA interpretation by the cardiologist is attached. COMPARISON:  None. FINDINGS: Mild multifocal linear scarring in the lower lobes of the lungs bilaterally. Within the visualized portions of the thorax there are no suspicious appearing pulmonary nodules or masses, there is no acute consolidative airspace disease, no pleural effusions, no pneumothorax and no lymphadenopathy. Visualized portions of the upper abdomen are unremarkable. There are no aggressive appearing lytic or blastic lesions noted in the visualized portions of the skeleton. IMPRESSION: 1. No significant incidental noncardiac findings are noted. Electronically Signed   By: Vinnie Langton M.D.   On: 04/30/2017 15:40   Ct Coronary Fractional Flow Reserve Fluid Analysis  Result Date: 04/30/2017 EXAM: OVER-READ INTERPRETATION  CT CHEST The following report is an over-read performed by radiologist Dr. Rebekah Chesterfield [facility] on 04/30/2017. This over-read does not include interpretation of cardiac or coronary anatomy or pathology. The coronary calcium score/coronary CTA interpretation by the  cardiologist is attached. COMPARISON:  None. FINDINGS: Mild multifocal linear scarring in the lower lobes of the lungs bilaterally. Within the visualized portions of the thorax there are no suspicious appearing pulmonary nodules or masses, there is no acute consolidative airspace disease, no pleural effusions, no pneumothorax and no lymphadenopathy. Visualized portions of the upper abdomen are unremarkable. There are no aggressive appearing lytic or blastic lesions noted in the visualized portions of the skeleton. IMPRESSION: 1. No significant incidental noncardiac findings are noted. Electronically Signed   By: Vinnie Langton M.D.   On: 04/30/2017 15:40    Cardiac Studies   Preliminary report of intermediate severity stenosis in the proximal LAD at the diagonal bifurcation.  Patient Profile     66 y.o. male with history of hyperlipidemia who presented to the hospital with hypotension/syncope after being stung 4-5  times by yellow jackets. He has no history of MI. We were asked to consult for elevated troponin of 0.34.  Assessment & Plan    Coronary CTA is concerning for possible LAD stenosis. FFR will not be available until AM. Schedule for cardiac cath tomorrow. This procedure (and possible angioplasty/stent) has been fully reviewed with the patient and written informed consent has been obtained. (If FFR is normal we have the option to cancel cath and treat medically).  For questions or updates, please contact [place] Please consult [email or web address] for contact info under Cardiology/STEMI.      Signed, Sanda Klein, MD  04/30/2017, 6:24 PM

## 2017-04-30 NOTE — Progress Notes (Signed)
PROGRESS NOTE    Travis Jenkins  EXH:[id number] DOB: March 13, 1951 DOA: 04/28/2017 PCP: Lujean Amel, MD   Brief Narrative:  Patient is a 66 yo Caucasian male who presented to the ED after multiple wasp stings after attempting to clear out a hive. He felt faint and lightheaded and passed out. He was brought in for Anaphylaxis and treated with IV Steroids, Benadryl and Famotidine along with Epinephrine. He had some mild chest discomfort and elevated troponin so Cardiology was consulted and because of patient's intermediate risk will underwent Coronary CT Angio. Plan is for Cardiac Catheterization in AM.  Assessment & Plan:   Principal Problem:   Anaphylaxis Active Problems:   Syncope   Chest pain   Elevated troponin   Leukocytosis   Hypophosphatemia   CKD (chronic kidney disease) stage 2, GFR 60-89 ml/min   Hyperlipidemia  Anaphylaxis w Syncope  -After multiple yellow jacket sting.   -Improved now after steroids/ benadryl in the field, and IM epi, IV Pepcid w NS bolus here. BP's stable, no wheezing.    -Will cont IVF's at NS 75 mL/hr, H2 blockers/ benadryl, steroids for now.  Chest Tightness with Elevated Troponin  -In the setting of Anaphylaxis  -Seen by Cardiology, now on IV heparin and will continue for now  -Troponin Trending Down and wen from 0.34 -> 0.06 -Patient underwent Coronary CTA of Chest today and read Cardiology pending as overread of Chest done by Radiology does not include include interpretation of cardiac or coronary anatomy or pathology. The coronary calcium score/coronary CTA interpretation by the Cardiologist was still pending; Per my discussion with Dr. Sallyanne Kuster, Dr. Meda Coffee felt as if was abnormal and patient to get a Cardiac Catheterization in AM  Leukocytosis -Likely reactive and in the setting of IV Steroid Demargination -WBC improved from 16.7 -> 14.6 -Continue to Monitor and Repeat CBC in AM   Hypophosphatemia -Patient's Phos Level was 2.4 -Replete  with IV KPhos -Continue to Monitor and Replete as Necessary  CKD Stage 2 -At baseline  -Continue to Monitor and Repeat CMP in AM  HLD -C/w Rosuvastatin 5 mg po Daily  DVT prophylaxis: Anticoagulated with Heparin gtt  Code Status: FULL CODE Family Communication: Discussed with wife at bedside Disposition Plan: Anticipate D/C in next 24-48 Hours if cleared by Cardiology  Consultants:   Cardiology  Procedures:    Antimicrobials: Anti-infectives    None     Subjective: Seen and examined at bedside and was awaiting CTA. No complaints overnight and denied any CP this AM. No SOB. No other complaints or concerns at this time.   Objective: Vitals:   04/29/17 1600 04/29/17 2210 04/30/17 0546 04/30/17 0856  BP: 116/68 122/69 122/67 138/73  Pulse: (!) 58 67 63 (!) 53  Resp: 20 18 18 18   Temp:  97.8 F (36.6 C) 98.3 F (36.8 C) 97.7 F (36.5 C)  TempSrc:  Oral Oral Oral  SpO2: 93% 99% 96% 96%  Weight:      Height:        Intake/Output Summary (Last 24 hours) at 04/30/17 1827 Last data filed at 04/30/17 1700  Gross per 24 hour  Intake          4442.09 ml  Output             1700 ml  Net          2742.09 ml   Filed Weights   04/28/17 2102  Weight: 102.1 kg (225 lb)   Examination: Physical Exam:  Constitutional: WN/WD Caucasian Male in NAD and appears calm and comfortable Eyes: Lids and conjunctivae normal, sclerae anicteric  ENMT: External Ears, Nose appear normal. Grossly normal hearing. Mucous membranes are moist.  Neck: Appears normal, supple, no cervical masses, normal ROM, no appreciable thyromegaly, no JVD Respiratory: Clear to auscultation bilaterally, no wheezing, rales, rhonchi or crackles. Normal respiratory effort and patient is not tachypenic. No accessory muscle use.  Cardiovascular: RRR, no murmurs / rubs / gallops. S1 and S2 auscultated. No extremity edema.  Abdomen: Soft, non-tender, non-distended. No masses palpated. No appreciable  hepatosplenomegaly. Bowel sounds positive x4.  GU: Deferred. Musculoskeletal: No clubbing / cyanosis of digits/nails. No joint deformity upper and lower extremities. Good ROM, no contractures.  Skin: No rashes or ulcers appreciated on limited skin eval. No induration; Warm and dry.  Neurologic: CN 2-12 grossly intact with no focal deficits. Sensation intact in all 4 Extremities, DTR normal. Strength 5/5 in all 4. Romberg sign cerebellar reflexes not assessed.  Psychiatric: Normal judgment and insight. Alert and oriented x 3. Normal mood and appropriate affect.   Data Reviewed: I have personally reviewed following labs and imaging studies  CBC:  Recent Labs Lab 04/28/17 2230 04/28/17 2323 04/29/17 0719 04/29/17 2012 04/30/17 0332  WBC 19.7*  --  14.7* 16.7* 14.6*  NEUTROABS 16.5*  --   --   --  13.3*  HGB 14.6 13.3 13.9 13.2 12.6*  HCT 42.4 39.0 40.7 38.8* 38.3*  MCV 89.8  --  90.4 92.6 92.7  PLT 96*  --  205 205 638   Basic Metabolic Panel:  Recent Labs Lab 04/28/17 2323 04/29/17 0845 04/30/17 0332  NA 142 136 137  K 3.9 4.5 4.7  CL 104 106 108  CO2  --  22 25  GLUCOSE 151* 138* 162*  BUN 16 11 18   CREATININE 1.30* 1.33* 1.21  CALCIUM  --  8.8* 8.6*  MG  --  1.8 2.2  PHOS  --  2.3* 2.4*   GFR: Estimated Creatinine Clearance: 77.5 mL/min (by C-G formula based on SCr of 1.21 mg/dL). Liver Function Tests:  Recent Labs Lab 04/30/17 0332  AST 35  ALT 15*  ALKPHOS 41  BILITOT 0.5  PROT 6.1*  ALBUMIN 3.7   No results for input(s): LIPASE, AMYLASE in the last 168 hours. No results for input(s): AMMONIA in the last 168 hours. Coagulation Profile: No results for input(s): INR, PROTIME in the last 168 hours. Cardiac Enzymes:  Recent Labs Lab 04/29/17 0845 04/29/17 1437 04/29/17 2012  TROPONINI 0.17* 0.08* 0.06*   BNP (last 3 results) No results for input(s): PROBNP in the last 8760 hours. HbA1C: No results for input(s): HGBA1C in the last 72  hours. CBG: No results for input(s): GLUCAP in the last 168 hours. Lipid Profile: No results for input(s): CHOL, HDL, LDLCALC, TRIG, CHOLHDL, LDLDIRECT in the last 72 hours. Thyroid Function Tests: No results for input(s): TSH, T4TOTAL, FREET4, T3FREE, THYROIDAB in the last 72 hours. Anemia Panel: No results for input(s): VITAMINB12, FOLATE, FERRITIN, TIBC, IRON, RETICCTPCT in the last 72 hours. Sepsis Labs: No results for input(s): PROCALCITON, LATICACIDVEN in the last 168 hours.  No results found for this or any previous visit (from the past 240 hour(s)).   Radiology Studies: Ct Coronary Morph W/cta Cor W/score W/ca W/cm &/or Wo/cm  Result Date: 04/30/2017 EXAM: OVER-READ INTERPRETATION  CT CHEST The following report is an over-read performed by radiologist Dr. Rebekah Chesterfield [facility] on 04/30/2017. This over-read does not  include interpretation of cardiac or coronary anatomy or pathology. The coronary calcium score/coronary CTA interpretation by the cardiologist is attached. COMPARISON:  None. FINDINGS: Mild multifocal linear scarring in the lower lobes of the lungs bilaterally. Within the visualized portions of the thorax there are no suspicious appearing pulmonary nodules or masses, there is no acute consolidative airspace disease, no pleural effusions, no pneumothorax and no lymphadenopathy. Visualized portions of the upper abdomen are unremarkable. There are no aggressive appearing lytic or blastic lesions noted in the visualized portions of the skeleton. IMPRESSION: 1. No significant incidental noncardiac findings are noted. Electronically Signed   By: Vinnie Langton M.D.   On: 04/30/2017 15:40   Dg Chest Port 1 View  Result Date: 04/28/2017 CLINICAL DATA:  Initial evaluation for acute allergic reaction. EXAM: PORTABLE CHEST 1 VIEW COMPARISON:  None available. FINDINGS: Transverse heart size at the upper limits of normal. Mediastinal silhouette normal. Lungs mildly  hypoinflated. No focal infiltrates. No pulmonary edema or pleural effusion. No pneumothorax. No acute osseous abnormality. Degenerative changes noted within the visualized spine. IMPRESSION: No active cardiopulmonary disease. Electronically Signed   By: Jeannine Boga M.D.   On: 04/28/2017 22:24   Ct Coronary Fractional Flow Reserve Data Prep  Result Date: 04/30/2017 EXAM: OVER-READ INTERPRETATION  CT CHEST The following report is an over-read performed by radiologist Dr. Rebekah Chesterfield [facility] on 04/30/2017. This over-read does not include interpretation of cardiac or coronary anatomy or pathology. The coronary calcium score/coronary CTA interpretation by the cardiologist is attached. COMPARISON:  None. FINDINGS: Mild multifocal linear scarring in the lower lobes of the lungs bilaterally. Within the visualized portions of the thorax there are no suspicious appearing pulmonary nodules or masses, there is no acute consolidative airspace disease, no pleural effusions, no pneumothorax and no lymphadenopathy. Visualized portions of the upper abdomen are unremarkable. There are no aggressive appearing lytic or blastic lesions noted in the visualized portions of the skeleton. IMPRESSION: 1. No significant incidental noncardiac findings are noted. Electronically Signed   By: Vinnie Langton M.D.   On: 04/30/2017 15:40   Ct Coronary Fractional Flow Reserve Fluid Analysis  Result Date: 04/30/2017 EXAM: OVER-READ INTERPRETATION  CT CHEST The following report is an over-read performed by radiologist Dr. Rebekah Chesterfield [facility] on 04/30/2017. This over-read does not include interpretation of cardiac or coronary anatomy or pathology. The coronary calcium score/coronary CTA interpretation by the cardiologist is attached. COMPARISON:  None. FINDINGS: Mild multifocal linear scarring in the lower lobes of the lungs bilaterally. Within the visualized portions of the thorax there are  no suspicious appearing pulmonary nodules or masses, there is no acute consolidative airspace disease, no pleural effusions, no pneumothorax and no lymphadenopathy. Visualized portions of the upper abdomen are unremarkable. There are no aggressive appearing lytic or blastic lesions noted in the visualized portions of the skeleton. IMPRESSION: 1. No significant incidental noncardiac findings are noted. Electronically Signed   By: Vinnie Langton M.D.   On: 04/30/2017 15:40   Scheduled Meds: . methylPREDNISolone (SOLU-MEDROL) injection  20 mg Intravenous Q12H  . nitroGLYCERIN      . rosuvastatin  5 mg Oral Daily  . traZODone  100 mg Oral QHS   Continuous Infusions: . sodium chloride 1,000 mL (04/29/17 1933)  . heparin 1,200 Units/hr (04/29/17 1705)    LOS: 0 days   Kerney Elbe, DO [facility] Pager [phone]  If 7PM-7AM, please contact night-coverage [email or web address] Password [facility] 04/30/2017, 6:27 PM

## 2017-04-30 NOTE — Progress Notes (Signed)
ANTICOAGULATION CONSULT NOTE - Follow Up Consult  Pharmacy Consult:  Heparin Indication: chest pain/ACS  Allergies  Allergen Reactions  . 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 Erythromycin Rash  . Tetracyclines & Related Rash    Patient Measurements: Height: 6\' 1"  (185.4 cm) Weight: 225 lb (102.1 kg) IBW/kg (Calculated) : 79.9 Heparin Dosing Weight: 100 kg  Vital Signs: Temp: 97.7 F (36.5 C) (09/13 0856) Temp Source: Oral (09/13 0856) BP: 138/73 (09/13 0856) Pulse Rate: 53 (09/13 0856)  Labs:  Recent Labs  04/28/17 2323 04/29/17 0719 04/29/17 0845 04/29/17 1437 04/29/17 2012 04/30/17 0332  HGB 13.3 13.9  --   --  13.2 12.6*  HCT 39.0 40.7  --   --  38.8* 38.3*  PLT  --  205  --   --  205 166  HEPARINUNFRC  --  0.72*  --  0.65  --  0.46  CREATININE 1.30*  --  1.33*  --   --  1.21  TROPONINI  --   --  0.17* 0.08* 0.06*  --     Estimated Creatinine Clearance: 77.5 mL/min (by C-G formula based on SCr of 1.21 mg/dL).  Assessment: 40 YOM presented with an allergic reaction after being stung by yellowjackets. Patient's troponin is elevated and was complaining of chest discomfort, so he was started on IV heparin. Currently awaiting CTA and possible cath.  Heparin level remains within range this morning. No bleeding noted. CBC stable.   Goal of Therapy:  Heparin level 0.3-0.7 units/ml Monitor platelets by anticoagulation protocol: Yes    Plan:  Continue heparin gtt at 1200 units/hr Daily heparin level and CBC F/u cardiology plans  Verlon Carcione D. Maximino Cozzolino, PharmD, BCPS Clinical Pharmacist Pager: [phone] Clinical Phone for 04/30/2017 until 3:30pm: [phone] If after 3:30pm, please call main pharmacy at [phone] 04/30/2017 10:10 AM

## 2017-04-30 NOTE — Discharge Summary (Signed)
Physician Discharge Summary  Travis MCMANAMON PPJ:[id number] DOB: 10/27/1950 DOA: 04/28/2017  PCP: Lujean Amel, MD  Admit date: 04/28/2017 Discharge date: 05/02/2017  Admitted From: Home Disposition: Home  Recommendations for Outpatient Follow-up:  1. Follow up with PCP in 1-2 weeks 2. Follow up with Cardiology as an outpatient. Follow up made by Cardiology 3. Cardiac Rehab as an outpatient  4. Please obtain CMP/CBC, Mag, Phos in one week  Home Health: No Equipment/Devices: None   Discharge Condition: Stable  CODE STATUS: FULL CODE Diet recommendation: Heart Healthy Diet  Brief/Interim Summary: Patient is a 66 yo Caucasian male who presented to the ED after multiple wasp stings after attempting to clear out a hive. He felt faint and lightheaded and passed out. He was brought in for Anaphylaxis and treated with IV Steroids, Benadryl and Famotidine along with Epinephrine. He had some mild chest discomfort and elevated troponin so Cardiology was consulted and because of patient's intermediate risk will underwent Coronary CT Angio. Patient underwent Cardiac Cath on 9/14 and had PCI intervention to the LAD with placement of a DES. He was kept overnight and had no complications and was started on ASA and Brilinta. He was deemed medically stable and will need to follow up with PCP and Cardiology within 1 week.    Discharge Diagnoses:  Principal Problem:   Anaphylaxis Active Problems:   Syncope   Chest pain   Elevated troponin   Leukocytosis   Hypophosphatemia   CKD (chronic kidney disease) stage 2, GFR 60-89 ml/min   Hyperlipidemia   NSTEMI (non-ST elevated myocardial infarction) ([place])  Chest Tightness with Elevated Troponin/NSTEMI/CAD s/p PCI stenting to the mid LAD with DES -In the setting of Anaphylaxis  -Troponin Trending Down and wen from 0.34 -> 0.06 -Patient underwent Coronary CTA of Chest today and read Cardiology pending as overread of Chest done by Radiology does not  include include interpretation of cardiac or coronary anatomy or pathology. The coronary calcium score/coronary CTA interpretation by the Cardiologist was still pending; Per my discussion with Dr. Sallyanne Kuster, Dr. Meda Coffee felt as if was abnormal and patient to get a Cardiac Catheterization in AM -Cardiac Cath showed significant single-vessel CAD with 70% stenosis in mid LAD which was stented with Resolute Onyx3.5 x 12 mm drug-eluting stent -Seen by Cardiology and off Heparin gtt and on Dual Antiplatelet Tx with ASA and Ticagrelor now and will continue at D/C -Statin increased to 20 mg po Daily   Anaphylaxis w Syncope  -After multiple yellow jacket sting.  -Improved now after steroids/ benadryl in the field, and IM epi, IV Pepcid w NS bolus here. BP's stable, no wheezing.  -Continued IVF's at NS 75 mL/hr (because of Cath), H2 blockers/ benadryl, steroids for now. -Syncope per Cards was likely Vasovagal response -Will discharge on Steroid Taper, Famotidine, PrN Benadryl and Epipen  Leukocytosis -Likely reactive and in the setting of IV Steroid Demargination -WBC improved from 16.7 -> 14.6 -> 11.0 -> 10.9 -Continue to Monitor and Repeat CBC as an outpatient  Hypophosphatemia -Patient's Phos Level was 3.0 -Continue to Monitor and Replete as Necessary -Follow up as an outpatient to repeat Phos  CKD Stage 2 -At baseline; BUN/Cr went from 18/1.21 -> 15/1.06 -> 15/1.05 -Continue to Monitor and Repeat CMP as an outpatient  HLD -Checked FLP in AM and showed Cholesterol of 132, HDL of 46, LDL of 64, TG of 108, and VLDL of 22 -Rosuvastatin 5 mg po Daily increased to 20 mg po Daily  Discharge Instructions  Discharge Instructions    Amb Referral to Cardiac Rehabilitation    Complete by:  As directed    Diagnosis:   Coronary Stents NSTEMI     Call MD for:  difficulty breathing, headache or visual disturbances    Complete by:  As directed    Call MD for:  extreme fatigue    Complete by:   As directed    Call MD for:  hives    Complete by:  As directed    Call MD for:  persistant dizziness or light-headedness    Complete by:  As directed    Call MD for:  persistant nausea and vomiting    Complete by:  As directed    Call MD for:  redness, tenderness, or signs of infection (pain, swelling, redness, odor or green/yellow discharge around incision site)    Complete by:  As directed    Call MD for:  severe uncontrolled pain    Complete by:  As directed    Call MD for:  temperature >100.4    Complete by:  As directed    Diet - low sodium heart healthy    Complete by:  As directed    Discharge instructions    Complete by:  As directed    Follow up with PCP and Cardiology as an outpatient. Take all medications as prescribed. If symptoms change or worsen please return to the ED for evaluation.   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 Notes to patient:  Prevents clotting in stent and heart attack    diphenhydrAMINE 25 mg capsule Commonly known as:  BENADRYL Take 1 capsule (25 mg total) by mouth every 6 (six) hours as needed for allergies. Notes to patient:  As needed for allergies    famotidine 20 MG tablet Commonly known as:  PEPCID Take 1 tablet (20 mg total) by mouth 2 (two) times daily. Notes to patient:  Acid reflux/heartburn    nitroGLYCERIN 0.4 MG SL tablet Commonly known as:  NITROSTAT Place 1 tablet (0.4 mg total) under the tongue every 5 (five) minutes as needed for chest pain. Notes to patient:  As needed for chest pain   predniSONE 10 MG (21) Tbpk tablet Commonly known  as:  STERAPRED UNI-PAK 21 TAB Take 6 tablets Day 1, 5 tablets Day 2, 4 Tablets Day 3, 3 Tablets Day 4, 2 Tablets Day 5, and 1 Tablet Day 6 and then Stop Day 7 Notes to patient:  Antiinflammatory for wasp stings Start today with 6 tablets as directed   rosuvastatin 20 MG tablet Commonly known as:  CRESTOR Take 1 tablet (20 mg total) by mouth daily. What changed:  medication strength  how much to take Notes to patient:  Cholesterol  DOSE CHANGE:5 mg increases to 20 mg   ticagrelor 90 MG Tabs tablet Commonly known as:  BRILINTA Take 1 tablet (90 mg total) by mouth 2 (two) times daily. Notes to patient:  Prevents clotting in stent and  heart attack    traZODone 100 MG tablet Commonly known as:  DESYREL Take 50-100 mg by mouth at bedtime. Notes to patient:  Sleep aid/antidepressant     ASK your doctor about these medications   EPINEPHrine 0.3 mg/0.3 mL Soaj injection Commonly known as:  EPI-PEN Inject 0.3 mLs (0.3 mg total) into the muscle once. Ask about: Should I take this medication?            Discharge Care Instructions        Start     Ordered   05/03/17 0000  rosuvastatin (CRESTOR) 20 MG tablet  Daily     05/02/17 1011   05/02/17 0000  Amb Referral to Cardiac Rehabilitation    Question Answer Comment  Diagnosis: Coronary Stents   Diagnosis: NSTEMI      05/02/17 0926   05/02/17 0000  nitroGLYCERIN (NITROSTAT) 0.4 MG SL tablet  Every 5 min PRN     05/02/17 1011   05/02/17 0000  ticagrelor (BRILINTA) 90 MG TABS tablet  2 times daily     05/02/17 1011   05/02/17 0000  Increase activity slowly     05/02/17 1011   05/02/17 0000  Diet - low sodium heart healthy     05/02/17 1011   05/02/17 0000  Discharge instructions    Comments:  Follow up with PCP and Cardiology as an outpatient. Take all medications as prescribed. If symptoms change or worsen please return to the ED for evaluation.   05/02/17 1011   05/02/17 0000  Call MD for:  temperature >100.4      05/02/17 1011   05/02/17 0000  Call MD for:  persistant nausea and vomiting     05/02/17 1011   05/02/17 0000  Call MD for:  severe uncontrolled pain     05/02/17 1011   05/02/17 0000  Call MD for:  redness, tenderness, or signs of infection (pain, swelling, redness, odor or green/yellow discharge around incision site)     05/02/17 1011   05/02/17 0000  Call MD for:  difficulty breathing, headache or visual disturbances     05/02/17 1011   05/02/17 0000  Call MD for:  hives     05/02/17 1011   05/02/17 0000  Call MD for:  persistant dizziness or light-headedness     05/02/17 1011   05/02/17 0000  Call MD for:  extreme fatigue     05/02/17 1011   04/30/17 0000  predniSONE (STERAPRED UNI-PAK 21 TAB) 10 MG (21) TBPK tablet     04/30/17 1811   04/30/17 0000  famotidine (PEPCID) 20 MG tablet  2 times daily     04/30/17 1811   04/30/17 0000  diphenhydrAMINE (BENADRYL) 25 mg capsule  Every 6 hours PRN     04/30/17 1811   04/30/17 0000  EPINEPHrine 0.3 mg/0.3 mL IJ SOAJ injection   Once     04/30/17 1811     Follow-up Information    Koirala, Dibas, MD. Call.   Specialty:  Family Medicine Why:  Call to make an appointment within 1 week Contact information: [place] [address] [phone]        Pixie Casino, MD Follow up.   Specialty:  Cardiology Why:  the office will call with date and time, call by Tuesday if you have not heard from them Contact information: [address] [place] [address] [phone]  Allergies  Allergen Reactions  . 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 Erythromycin Rash  . Tetracyclines & Related Rash   Consultations:  Cardiology Dr. Sallyanne Kuster   Procedures/Studies: Ct Coronary Morph W/cta Cor Nancy Fetter W/ca W/cm &/or Wo/cm  Addendum Date: 04/30/2017   ADDENDUM REPORT: 04/30/2017 18:27 CLINICAL DATA:  15 -year-old male with troponin  elevation in the settings of anaphylactic shock. EXAM: Cardiac/Coronary  CT TECHNIQUE: The patient was scanned on a [employer]. FINDINGS: A 120 kV prospective scan was triggered in the descending thoracic aorta at 111 HU's. Axial non-contrast 3 mm slices were carried out through the heart. The data set was analyzed on a dedicated work station and scored using the [place]. Gantry rotation speed was 250 msecs and collimation was .6 mm. No beta blockade and 0.8 mg of sl NTG was given. The 3D data set was reconstructed in 5% intervals of the 67-82 % of the R-R cycle. Diastolic phases were analyzed on a dedicated work station using MPR, MIP and VRT modes. The patient received 80 cc of contrast. Aorta:  Normal size.  No calcifications.  No dissection. Aortic Valve:  Trileaflet.  No calcifications. Coronary Arteries:  Normal coronary origin.  Right dominance. RCA is a large dominant artery that gives rise to PDA and PLVB. There is no plaque. Left main is a very large artery that gives rise to LAD, ramus intermedius and LCX arteries. LAD is a large vessel that gives rise to one large diagonal branch. There is minimal eccentric calcified plaque in the proximal LAD with associated stenosis 0-25%. Proximal to mid LAD has moderate noncalcified plaque just prior to the D1 takeoff with associated stenosis 50-69%. RI is a small artery that has no plaque. LCX is a non-dominant artery that gives rise to one OM1 branch. There is no plaque. Other findings: Normal pulmonary vein drainage into the left atrium. Normal let atrial appendage without a thrombus. Normal size of the pulmonary artery. IMPRESSION: 1. Normal coronary origin with right dominance. 2. There is a moderate noncalcified plaque in the proximal to mid LAD, just prior to the D1 takeoff with associated stenosis 50-69%. Additional analysis with CT FFR will be submitted. Ena Dawley Electronically Signed   By: Ena Dawley   On: 04/30/2017 18:27    Result Date: 04/30/2017 EXAM: OVER-READ INTERPRETATION  CT CHEST The following report is an over-read performed by radiologist Dr. Rebekah [facility] on 04/30/2017. This over-read does not include interpretation of cardiac or coronary anatomy or pathology. The coronary calcium score/coronary CTA interpretation by the cardiologist is attached. COMPARISON:  None. FINDINGS: Mild multifocal linear scarring in the lower lobes of the lungs bilaterally. Within the visualized portions of the thorax there are no suspicious appearing pulmonary nodules or masses, there is no acute consolidative airspace disease, no pleural effusions, no pneumothorax and no lymphadenopathy. Visualized portions of the upper abdomen are unremarkable. There are no aggressive appearing lytic or blastic lesions noted in the visualized portions of the skeleton. IMPRESSION: 1. No significant incidental noncardiac findings are noted. Electronically Signed: By: Vinnie Langton M.D. On: 04/30/2017 15:40   Dg Chest Port 1 View  Result Date: 04/28/2017 CLINICAL DATA:  Initial evaluation for acute allergic reaction. EXAM: PORTABLE CHEST 1 VIEW COMPARISON:  None available. FINDINGS: Transverse heart size at the upper limits of normal. Mediastinal silhouette normal. Lungs mildly hypoinflated. No focal infiltrates. No pulmonary edema or pleural effusion. No  pneumothorax. No acute osseous abnormality. Degenerative changes noted within the visualized spine. IMPRESSION: No active cardiopulmonary disease. Electronically Signed   By: Jeannine Boga M.D.   On: 04/28/2017 22:24   Ct Coronary Fractional Flow Reserve Data Prep  Addendum Date: 04/30/2017   ADDENDUM REPORT: 04/30/2017 18:27 CLINICAL DATA:  10 -year-old male with troponin elevation in the settings of anaphylactic shock. EXAM: Cardiac/Coronary  CT TECHNIQUE: The patient was scanned on a [employer]. FINDINGS: A 120 kV prospective scan was triggered in  the descending thoracic aorta at 111 HU's. Axial non-contrast 3 mm slices were carried out through the heart. The data set was analyzed on a dedicated work station and scored using the [place]. Gantry rotation speed was 250 msecs and collimation was .6 mm. No beta blockade and 0.8 mg of sl NTG was given. The 3D data set was reconstructed in 5% intervals of the 67-82 % of the R-R cycle. Diastolic phases were analyzed on a dedicated work station using MPR, MIP and VRT modes. The patient received 80 cc of contrast. Aorta:  Normal size.  No calcifications.  No dissection. Aortic Valve:  Trileaflet.  No calcifications. Coronary Arteries:  Normal coronary origin.  Right dominance. RCA is a large dominant artery that gives rise to PDA and PLVB. There is no plaque. Left main is a very large artery that gives rise to LAD, ramus intermedius and LCX arteries. LAD is a large vessel that gives rise to one large diagonal branch. There is minimal eccentric calcified plaque in the proximal LAD with associated stenosis 0-25%. Proximal to mid LAD has moderate noncalcified plaque just prior to the D1 takeoff with associated stenosis 50-69%. RI is a small artery that has no plaque. LCX is a non-dominant artery that gives rise to one OM1 branch. There is no plaque. Other findings: Normal pulmonary vein drainage into the left atrium. Normal let atrial appendage without a thrombus. Normal size of the pulmonary artery. IMPRESSION: 1. Normal coronary origin with right dominance. 2. There is a moderate noncalcified plaque in the proximal to mid LAD, just prior to the D1 takeoff with associated stenosis 50-69%. Additional analysis with CT FFR will be submitted. Ena Dawley Electronically Signed   By: Ena Dawley   On: 04/30/2017 18:27   Result Date: 04/30/2017 EXAM: OVER-READ INTERPRETATION  CT CHEST The following report is an over-read performed by radiologist Dr. Rebekah [facility] on 04/30/2017.  This over-read does not include interpretation of cardiac or coronary anatomy or pathology. The coronary calcium score/coronary CTA interpretation by the cardiologist is attached. COMPARISON:  None. FINDINGS: Mild multifocal linear scarring in the lower lobes of the lungs bilaterally. Within the visualized portions of the thorax there are no suspicious appearing pulmonary nodules or masses, there is no acute consolidative airspace disease, no pleural effusions, no pneumothorax and no lymphadenopathy. Visualized portions of the upper abdomen are unremarkable. There are no aggressive appearing lytic or blastic lesions noted in the visualized portions of the skeleton. IMPRESSION: 1. No significant incidental noncardiac findings are noted. Electronically Signed: By: Vinnie Langton M.D. On: 04/30/2017 15:40   Ct Coronary Fractional Flow Reserve Fluid Analysis  Addendum Date: 04/30/2017   ADDENDUM REPORT: 04/30/2017 18:27 CLINICAL DATA:  84 -year-old male with troponin elevation in the settings of anaphylactic shock. EXAM: Cardiac/Coronary  CT TECHNIQUE: The patient was scanned on a [employer]. FINDINGS: A 120 kV prospective scan was triggered in the descending thoracic aorta at 111 HU's. Axial  non-contrast 3 mm slices were carried out through the heart. The data set was analyzed on a dedicated work station and scored using the [place]. Gantry rotation speed was 250 msecs and collimation was .6 mm. No beta blockade and 0.8 mg of sl NTG was given. The 3D data set was reconstructed in 5% intervals of the 67-82 % of the R-R cycle. Diastolic phases were analyzed on a dedicated work station using MPR, MIP and VRT modes. The patient received 80 cc of contrast. Aorta:  Normal size.  No calcifications.  No dissection. Aortic Valve:  Trileaflet.  No calcifications. Coronary Arteries:  Normal coronary origin.  Right dominance. RCA is a large dominant artery that gives rise to PDA and PLVB. There is no  plaque. Left main is a very large artery that gives rise to LAD, ramus intermedius and LCX arteries. LAD is a large vessel that gives rise to one large diagonal branch. There is minimal eccentric calcified plaque in the proximal LAD with associated stenosis 0-25%. Proximal to mid LAD has moderate noncalcified plaque just prior to the D1 takeoff with associated stenosis 50-69%. RI is a small artery that has no plaque. LCX is a non-dominant artery that gives rise to one OM1 branch. There is no plaque. Other findings: Normal pulmonary vein drainage into the left atrium. Normal let atrial appendage without a thrombus. Normal size of the pulmonary artery. IMPRESSION: 1. Normal coronary origin with right dominance. 2. There is a moderate noncalcified plaque in the proximal to mid LAD, just prior to the D1 takeoff with associated stenosis 50-69%. Additional analysis with CT FFR will be submitted. Ena Dawley Electronically Signed   By: Ena Dawley   On: 04/30/2017 18:27   Result Date: 04/30/2017 EXAM: OVER-READ INTERPRETATION  CT CHEST The following report is an over-read performed by radiologist Dr. Rebekah [facility] on 04/30/2017. This over-read does not include interpretation of cardiac or coronary anatomy or pathology. The coronary calcium score/coronary CTA interpretation by the cardiologist is attached. COMPARISON:  None. FINDINGS: Mild multifocal linear scarring in the lower lobes of the lungs bilaterally. Within the visualized portions of the thorax there are no suspicious appearing pulmonary nodules or masses, there is no acute consolidative airspace disease, no pleural effusions, no pneumothorax and no lymphadenopathy. Visualized portions of the upper abdomen are unremarkable. There are no aggressive appearing lytic or blastic lesions noted in the visualized portions of the skeleton. IMPRESSION: 1. No significant incidental noncardiac findings are noted. Electronically Signed:  By: Vinnie Langton M.D. On: 04/30/2017 15:40    Subjective: Seen and examined at bedside and was doing fine. Denied any CP or SOB. Felt great and ready to go home.    Discharge Exam: Vitals:   05/02/17 0700 05/02/17 0740  BP: (!) 143/72   Pulse: (!) 47   Resp: 16   Temp:  97.9 F (36.6 C)  SpO2: 97%    Vitals:   05/02/17 0500 05/02/17 0600 05/02/17 0700 05/02/17 0740  BP: 140/67 132/81 (!) 143/72   Pulse: 67 (!) 50 (!) 47   Resp: 17 16 16    Temp:    97.9 F (36.6 C)  TempSrc:    Oral  SpO2: 97% 94% 97%   Weight:      Height:       General: Pt is alert, awake, not in acute distress Cardiovascular: RRR, S1/S2 +, no rubs, no gallops Respiratory: CTA bilaterally, no wheezing, no rhonchi Abdominal: Soft, NT, ND, bowel sounds +  Extremities: no edema, no cyanosis  The results of significant diagnostics from this hospitalization (including imaging, microbiology, ancillary and laboratory) are listed below for reference.    Microbiology: No results found for this or any previous visit (from the past 240 hour(s)).   Labs: BNP (last 3 results) No results for input(s): BNP in the last 8760 hours. Basic Metabolic Panel:  Recent Labs Lab 04/28/17 2323 04/29/17 0845 04/30/17 0332 05/01/17 0502 05/02/17 0453  NA 142 136 137 137 137  K 3.9 4.5 4.7 4.1 3.9  CL 104 106 108 110 108  CO2  --  22 25 22 24   GLUCOSE 151* 138* 162* 156* 131*  BUN 16 11 18 15 15   CREATININE 1.30* 1.33* 1.21 1.06 1.05  CALCIUM  --  8.8* 8.6* 8.4* 8.6*  MG  --  1.8 2.2 2.1 2.0  PHOS  --  2.3* 2.4* 2.8 3.0   Liver Function Tests:  Recent Labs Lab 04/30/17 0332 05/01/17 0502 05/02/17 0453  AST 35 27 26  ALT 15* 15* 17  ALKPHOS 41 36* 37*  BILITOT 0.5 0.4 0.7  PROT 6.1* 5.8* 6.0*  ALBUMIN 3.7 3.6 3.6   No results for input(s): LIPASE, AMYLASE in the last 168 hours. No results for input(s): AMMONIA in the last 168 hours. CBC:  Recent Labs Lab 04/28/17 2230  04/29/17 0719  04/29/17 2012 04/30/17 0332 05/01/17 0502 05/02/17 0453  WBC 19.7*  --  14.7* 16.7* 14.6* 11.0* 10.9*  NEUTROABS 16.5*  --   --   --  13.3* 9.9* 9.7*  HGB 14.6  < > 13.9 13.2 12.6* 12.4* 13.3  HCT 42.4  < > 40.7 38.8* 38.3* 37.5* 38.8*  MCV 89.8  --  90.4 92.6 92.7 93.8 91.3  PLT 96*  --  205 205 166 180 195  < > = values in this interval not displayed. Cardiac Enzymes:  Recent Labs Lab 04/29/17 0845 04/29/17 1437 04/29/17 2012  TROPONINI 0.17* 0.08* 0.06*   BNP: Invalid input(s): POCBNP CBG:  Recent Labs Lab 05/01/17 2133  GLUCAP 139*   D-Dimer No results for input(s): DDIMER in the last 72 hours. Hgb A1c No results for input(s): HGBA1C in the last 72 hours. Lipid Profile  Recent Labs  05/02/17 0453  CHOL 132  HDL 46  LDLCALC 64  TRIG 108  CHOLHDL 2.9   Thyroid function studies No results for input(s): TSH, T4TOTAL, T3FREE, THYROIDAB in the last 72 hours.  Invalid input(s): FREET3 Anemia work up No results for input(s): VITAMINB12, FOLATE, FERRITIN, TIBC, IRON, RETICCTPCT in the last 72 hours. Urinalysis No results found for: COLORURINE, APPEARANCEUR, LABSPEC, [place], GLUCOSEU, HGBUR, BILIRUBINUR, KETONESUR, PROTEINUR, UROBILINOGEN, NITRITE, LEUKOCYTESUR Sepsis Labs Invalid input(s): PROCALCITONIN,  WBC,  LACTICIDVEN Microbiology No results found for this or any previous visit (from the past 240 hour(s)).  Time coordinating discharge: 35 minutes  SIGNED:  Kerney Elbe, DO [facility] 05/02/2017, 8:54 PM Pager [phone]  If 7PM-7AM, please contact night-coverage [email or web address] Password [email or web address]

## 2017-05-01 ENCOUNTER — Encounter (HOSPITAL_COMMUNITY)
Admission: EM | Disposition: A | Discharge status: Home / Self Care | Payer: Self-pay | Source: Home / Self Care | Attending: Internal Medicine

## 2017-05-01 DIAGNOSIS — I214 Non-ST elevation (NSTEMI) myocardial infarction: Secondary | ICD-10-CM

## 2017-05-01 DIAGNOSIS — R001 Bradycardia, unspecified: Secondary | ICD-10-CM

## 2017-05-01 DIAGNOSIS — I2583 Coronary atherosclerosis due to lipid rich plaque: Secondary | ICD-10-CM

## 2017-05-01 DIAGNOSIS — I2510 Atherosclerotic heart disease of native coronary artery without angina pectoris: Secondary | ICD-10-CM

## 2017-05-01 HISTORY — PX: LEFT HEART CATH AND CORONARY ANGIOGRAPHY: CATH118249

## 2017-05-01 HISTORY — DX: Bradycardia, unspecified: R00.1

## 2017-05-01 HISTORY — PX: INTRAVASCULAR PRESSURE WIRE/FFR STUDY: CATH118243

## 2017-05-01 HISTORY — PX: CORONARY STENT INTERVENTION: CATH118234

## 2017-05-01 LAB — POCT ACTIVATED CLOTTING TIME
Activated Clotting Time: 202 seconds
Activated Clotting Time: 224 seconds
Activated Clotting Time: 246 seconds
Activated Clotting Time: 252 seconds
Activated Clotting Time: 257 seconds
Activated Clotting Time: 241 seconds

## 2017-05-01 LAB — PHOSPHORUS: Phosphorus: 2.8 mg/dL (ref 2.5–4.6)

## 2017-05-01 LAB — MAGNESIUM: Magnesium: 2.1 mg/dL (ref 1.7–2.4)

## 2017-05-01 LAB — CBC WITH DIFFERENTIAL/PLATELET
Basophils Absolute: 0 10*3/uL (ref 0.0–0.1)
Basophils Relative: 0 %
Eosinophils Absolute: 0 10*3/uL (ref 0.0–0.7)
Eosinophils Relative: 0 %
HCT: 37.5 % — ABNORMAL LOW (ref 39.0–52.0)
Hemoglobin: 12.4 g/dL — ABNORMAL LOW (ref 13.0–17.0)
Lymphocytes Relative: 7 %
Lymphs Abs: 0.8 10*3/uL (ref 0.7–4.0)
MCH: 31 pg (ref 26.0–34.0)
MCHC: 33.1 g/dL (ref 30.0–36.0)
MCV: 93.8 fL (ref 78.0–100.0)
Monocytes Absolute: 0.4 10*3/uL (ref 0.1–1.0)
Monocytes Relative: 3 %
Neutro Abs: 9.9 10*3/uL — ABNORMAL HIGH (ref 1.7–7.7)
Neutrophils Relative %: 90 %
Platelets: 180 10*3/uL (ref 150–400)
RBC: 4 MIL/uL — ABNORMAL LOW (ref 4.22–5.81)
RDW: 12.9 % (ref 11.5–15.5)
WBC: 11 10*3/uL — ABNORMAL HIGH (ref 4.0–10.5)

## 2017-05-01 LAB — COMPREHENSIVE METABOLIC PANEL
ALT: 15 U/L — ABNORMAL LOW (ref 17–63)
AST: 27 U/L (ref 15–41)
Albumin: 3.6 g/dL (ref 3.5–5.0)
Alkaline Phosphatase: 36 U/L — ABNORMAL LOW (ref 38–126)
Anion gap: 5 (ref 5–15)
BUN: 15 mg/dL (ref 6–20)
CO2: 22 mmol/L (ref 22–32)
Calcium: 8.4 mg/dL — ABNORMAL LOW (ref 8.9–10.3)
Chloride: 110 mmol/L (ref 101–111)
Creatinine, Ser: 1.06 mg/dL (ref 0.61–1.24)
GFR calc Af Amer: >60 mL/min (ref >60)
GFR calc non Af Amer: >60 mL/min (ref >60)
Glucose, Bld: 156 mg/dL — ABNORMAL HIGH (ref 65–99)
Potassium: 4.1 mmol/L (ref 3.5–5.1)
Sodium: 137 mmol/L (ref 135–145)
Total Bilirubin: 0.4 mg/dL (ref 0.3–1.2)
Total Protein: 5.8 g/dL — ABNORMAL LOW (ref 6.5–8.1)

## 2017-05-01 LAB — PROTIME-INR
INR: 1.08
Prothrombin Time: 13.9 seconds (ref 11.4–15.2)

## 2017-05-01 LAB — GLUCOSE, CAPILLARY: Glucose-Capillary: 139 mg/dL — ABNORMAL HIGH (ref 65–99)

## 2017-05-01 LAB — HEPARIN LEVEL (UNFRACTIONATED): Heparin Unfractionated: 0.45 IU/mL (ref 0.30–0.70)

## 2017-05-01 SURGERY — LEFT HEART CATH AND CORONARY ANGIOGRAPHY
Anesthesia: LOCAL

## 2017-05-01 MED ORDER — SODIUM CHLORIDE 0.9 % IV SOLN: 250.0000 mL | INTRAVENOUS | Status: DC | PRN

## 2017-05-01 MED ORDER — HEPARIN SODIUM (PORCINE) 1000 UNIT/ML IJ SOLN
INTRAMUSCULAR | Status: AC
  Filled 2017-05-01: qty 1

## 2017-05-01 MED ORDER — ASPIRIN EC 81 MG PO TBEC
81.0000 mg | DELAYED_RELEASE_TABLET | Freq: Every day | ORAL | Status: DC
  Administered 2017-05-02: 08:00:00 81 mg via ORAL
  Filled 2017-05-01: qty 1

## 2017-05-01 MED ORDER — SODIUM CHLORIDE 0.9% FLUSH: 3.0000 mL | INTRAVENOUS | Status: DC | PRN

## 2017-05-01 MED ORDER — HEPARIN SODIUM (PORCINE) 1000 UNIT/ML IJ SOLN
INTRAMUSCULAR | Status: DC | PRN
  Administered 2017-05-01: 2000 [IU] via INTRAVENOUS

## 2017-05-01 MED ORDER — ADENOSINE 12 MG/4ML IV SOLN
INTRAVENOUS | Status: AC
  Filled 2017-05-01: qty 12

## 2017-05-01 MED ORDER — SODIUM CHLORIDE 0.9 % IV SOLN
INTRAVENOUS | Status: AC
  Administered 2017-05-01: 15:00:00 via INTRAVENOUS

## 2017-05-01 MED ORDER — SODIUM CHLORIDE 0.9% FLUSH
3.0000 mL | Freq: Two times a day (BID) | INTRAVENOUS | Status: DC
  Administered 2017-05-01: 19:00:00 3 mL via INTRAVENOUS

## 2017-05-01 MED ORDER — ANGIOPLASTY BOOK
Freq: Once | Status: AC
  Administered 2017-05-01: 20:00:00
  Filled 2017-05-01: qty 1

## 2017-05-01 MED ORDER — IOPAMIDOL (ISOVUE-370) INJECTION 76%
INTRAVENOUS | Status: DC | PRN
  Administered 2017-05-01: 180 mL via INTRAVENOUS

## 2017-05-01 MED ORDER — TICAGRELOR 90 MG PO TABS
ORAL_TABLET | ORAL | Status: AC
  Filled 2017-05-01: qty 1

## 2017-05-01 MED ORDER — THE SENSUOUS HEART BOOK
Freq: Once | Status: AC
  Administered 2017-05-01: 20:00:00
  Filled 2017-05-01: qty 1

## 2017-05-01 MED ORDER — LABETALOL HCL 5 MG/ML IV SOLN: 10.0000 mg | INTRAVENOUS | Status: AC | PRN

## 2017-05-01 MED ORDER — ADENOSINE 12 MG/4ML IV SOLN
INTRAVENOUS | Status: AC
  Filled 2017-05-01: qty 4

## 2017-05-01 MED ORDER — TICAGRELOR 90 MG PO TABS
90.0000 mg | ORAL_TABLET | Freq: Two times a day (BID) | ORAL | Status: DC
  Administered 2017-05-01 – 2017-05-02 (×2): 90 mg via ORAL
  Filled 2017-05-01 (×2): qty 1

## 2017-05-01 MED ORDER — ROSUVASTATIN CALCIUM 20 MG PO TABS
20.0000 mg | ORAL_TABLET | Freq: Every day | ORAL | Status: DC
  Administered 2017-05-02: 20 mg via ORAL
  Filled 2017-05-01: qty 1

## 2017-05-01 MED ORDER — HYDRALAZINE HCL 20 MG/ML IJ SOLN: 5.0000 mg | INTRAMUSCULAR | Status: AC | PRN

## 2017-05-01 MED ORDER — MIDAZOLAM HCL 2 MG/2ML IJ SOLN
INTRAMUSCULAR | Status: DC | PRN
  Administered 2017-05-01: 1 mg via INTRAVENOUS

## 2017-05-01 MED ORDER — MIDAZOLAM HCL 2 MG/2ML IJ SOLN
INTRAMUSCULAR | Status: AC
  Filled 2017-05-01: qty 2

## 2017-05-01 MED ORDER — ADENOSINE (DIAGNOSTIC) 140MCG/KG/MIN
INTRAVENOUS | Status: DC | PRN
  Administered 2017-05-01: 140 ug/kg/min via INTRAVENOUS

## 2017-05-01 MED ORDER — LIDOCAINE HCL (PF) 1 % IJ SOLN
INTRAMUSCULAR | Status: DC | PRN
  Administered 2017-05-01: 2 mL

## 2017-05-01 MED ORDER — HEART ATTACK BOUNCING BOOK
Freq: Once | Status: AC
  Administered 2017-05-01: 20:00:00
  Filled 2017-05-01: qty 1

## 2017-05-01 MED ORDER — METHYLPREDNISOLONE SODIUM SUCC 40 MG IJ SOLR
20.0000 mg | Freq: Two times a day (BID) | INTRAMUSCULAR | Status: DC
  Administered 2017-05-01: 21:00:00 20 mg via INTRAVENOUS
  Filled 2017-05-01: qty 1
  Filled 2017-05-01: qty 0.5

## 2017-05-01 MED ORDER — VERAPAMIL HCL 2.5 MG/ML IV SOLN
INTRAVENOUS | Status: AC
  Filled 2017-05-01: qty 2

## 2017-05-01 MED ORDER — NITROGLYCERIN 1 MG/10 ML FOR IR/CATH LAB
INTRA_ARTERIAL | Status: AC
  Filled 2017-05-01: qty 10

## 2017-05-01 MED ORDER — HEPARIN SODIUM (PORCINE) 1000 UNIT/ML IJ SOLN
INTRAMUSCULAR | Status: DC | PRN
  Administered 2017-05-01: 4000 [IU] via INTRAVENOUS
  Administered 2017-05-01: 3000 [IU] via INTRAVENOUS
  Administered 2017-05-01: 2000 [IU] via INTRAVENOUS
  Administered 2017-05-01: 5000 [IU] via INTRAVENOUS
  Administered 2017-05-01: 2000 [IU] via INTRAVENOUS
  Administered 2017-05-01: 5000 [IU] via INTRAVENOUS

## 2017-05-01 MED ORDER — HEPARIN (PORCINE) IN NACL 2-0.9 UNIT/ML-% IJ SOLN
INTRAMUSCULAR | Status: AC
  Filled 2017-05-01: qty 1000

## 2017-05-01 MED ORDER — NITROGLYCERIN 1 MG/10 ML FOR IR/CATH LAB
INTRA_ARTERIAL | Status: DC | PRN
  Administered 2017-05-01 (×2): 200 ug via INTRACORONARY

## 2017-05-01 MED ORDER — ENOXAPARIN SODIUM 40 MG/0.4ML ~~LOC~~ SOLN: 40.0000 mg | SUBCUTANEOUS | Status: DC

## 2017-05-01 MED ORDER — HEPARIN (PORCINE) IN NACL 2-0.9 UNIT/ML-% IJ SOLN
INTRAMUSCULAR | Status: AC | PRN
  Administered 2017-05-01: 1500 mL

## 2017-05-01 MED ORDER — LIDOCAINE HCL (PF) 1 % IJ SOLN
INTRAMUSCULAR | Status: AC
  Filled 2017-05-01: qty 30

## 2017-05-01 MED ORDER — IOPAMIDOL (ISOVUE-370) INJECTION 76%
INTRAVENOUS | Status: AC
  Filled 2017-05-01: qty 50

## 2017-05-01 MED ORDER — VERAPAMIL HCL 2.5 MG/ML IV SOLN
INTRAVENOUS | Status: DC | PRN
  Administered 2017-05-01: 10 mL via INTRA_ARTERIAL

## 2017-05-01 MED ORDER — FENTANYL CITRATE (PF) 100 MCG/2ML IJ SOLN
INTRAMUSCULAR | Status: AC
  Filled 2017-05-01: qty 2

## 2017-05-01 MED ORDER — FENTANYL CITRATE (PF) 100 MCG/2ML IJ SOLN
INTRAMUSCULAR | Status: DC | PRN
  Administered 2017-05-01: 50 ug via INTRAVENOUS

## 2017-05-01 MED ORDER — IOPAMIDOL (ISOVUE-370) INJECTION 76%
INTRAVENOUS | Status: AC
  Filled 2017-05-01: qty 100

## 2017-05-01 SURGICAL SUPPLY — 24 items
BALLN EMERGE MR 2.5X8 (BALLOONS) ×2
BALLN ~~LOC~~ EUPHORA RX 2.0X12 (BALLOONS) ×2
BALLN ~~LOC~~ EUPHORA RX 4.0X6 (BALLOONS) ×2
BALLOON EMERGE MR 2.5X8 (BALLOONS) ×1 IMPLANT
BALLOON ~~LOC~~ EUPHORA RX 2.0X12 (BALLOONS) ×1 IMPLANT
BALLOON ~~LOC~~ EUPHORA RX 4.0X6 (BALLOONS) ×1 IMPLANT
CATH 5FR JL3.5 JR4 ANG PIG MP (CATHETERS) ×2 IMPLANT
CATH LAUNCHER 6FR EBU3.5 (CATHETERS) ×2 IMPLANT
CATH MICROCATH NAVVUS (MICROCATHETER) ×1 IMPLANT
DEVICE RAD COMP TR BAND LRG (VASCULAR PRODUCTS) ×2 IMPLANT
GLIDESHEATH SLEND SS 6F .021 (SHEATH) ×2 IMPLANT
GUIDEWIRE INQWIRE 1.5J.035X260 (WIRE) ×1 IMPLANT
INQWIRE 1.5J .035X260CM (WIRE) ×2
KIT ENCORE 26 ADVANTAGE (KITS) ×2 IMPLANT
KIT ESSENTIALS PG (KITS) ×2 IMPLANT
KIT HEART LEFT (KITS) ×2 IMPLANT
MICROCATHETER NAVVUS (MICROCATHETER) ×2
PACK CARDIAC CATHETERIZATION (CUSTOM PROCEDURE TRAY) ×2 IMPLANT
STENT RESOLUTE ONYX 3.5X12 (Permanent Stent) ×2 IMPLANT
SYR MEDRAD MARK V 150ML (SYRINGE) ×2 IMPLANT
TRANSDUCER W/STOPCOCK (MISCELLANEOUS) ×2 IMPLANT
TUBING CIL FLEX 10 FLL-RA (TUBING) ×2 IMPLANT
WIRE HI TORQ BMW 190CM (WIRE) ×2 IMPLANT
WIRE RUNTHROUGH .014X180CM (WIRE) ×2 IMPLANT

## 2017-05-01 NOTE — Progress Notes (Signed)
ANTICOAGULATION CONSULT NOTE - Follow Up Consult  Pharmacy Consult:  Heparin Indication: chest pain/ACS  Allergies  Allergen Reactions  . 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 Erythromycin Rash  . Tetracyclines & Related Rash    Patient Measurements: Height: 6\' 1"  (185.4 cm) Weight: 225 lb (102.1 kg) IBW/kg (Calculated) : 79.9 Heparin Dosing Weight: 100 kg  Assessment: 89 YOM presented with an allergic reaction after being stung by yellowjackets. Patient's troponin is elevated and was complaining of chest discomfort, so he was started on IV heparin. Currently awaiting CTA and possible cath.  On heparin for ACS, troponin 0.34>0.06. HL remains therapeutic. Going to cath today. Hgb 12.4, plts wnl.  Goal of Therapy:  Heparin level 0.3-0.7 units/ml Monitor platelets by anticoagulation protocol: Yes  Plan:  Continue heparin gtt at 1,200 units/hr Daily heparin level and CBC F/U s/p cath  Elenor Quinones, PharmD, [facility] Clinical Pharmacist Pager [phone] 05/01/2017 7:48 AM

## 2017-05-01 NOTE — Care Management Note (Signed)
Case Management Note  Patient Details  Name: KIZER NOBBE MRN: [id number] Date of Birth: 19-Mar-1951  Subjective/Objective:    From home, s/p coronary stent intervention, will be on brilinta, NCM gave patient the $5 co pay savings card.  NCM advised patient to go to [facility] at dc. For first 30 day.               Action/Plan: NCM will follow for dc needs.   Expected Discharge Date:                  Expected Discharge Plan:  Home/Self Care  In-House Referral:     Discharge planning Services  CM Consult  Post Acute Care Choice:    Choice offered to:     DME Arranged:    DME Agency:     HH Arranged:    [place] Agency:     Status of Service:  Completed, signed off  If discussed at [employer] of Stay Meetings, dates discussed:    Additional Comments:  Zenon Mayo, RN 05/01/2017, 4:15 PM

## 2017-05-01 NOTE — Progress Notes (Signed)
PROGRESS NOTE    Travis Jenkins  XBM:[id number] DOB: 05-15-1951 DOA: 04/28/2017 PCP: Lujean Amel, MD   Brief Narrative:  Patient is a 66 yo Caucasian male who presented to the ED after multiple wasp stings after attempting to clear out a hive. He felt faint and lightheaded and passed out. He was brought in for Anaphylaxis and treated with IV Steroids, Benadryl and Famotidine along with Epinephrine. He had some mild chest discomfort and elevated troponin so Cardiology was consulted and because of patient's intermediate risk will underwent Coronary CT Angio. Patient underwent Cardiac Cath this AM and had PCI intervention to the LAD with placement of a DES.   Assessment & Plan:   Principal Problem:   Anaphylaxis Active Problems:   Syncope   Chest pain   Elevated troponin   Leukocytosis   Hypophosphatemia   CKD (chronic kidney disease) stage 2, GFR 60-89 ml/min   Hyperlipidemia   NSTEMI (non-ST elevated myocardial infarction) (HCC)  Chest Tightness with Elevated Troponin/NSTEMI/CAD s/p PCI stenting to the mid LAD with DES -In the setting of Anaphylaxis  -Seen by Cardiology and off Heparin gtt and on Dual Antiplatelet Tx with ASA and Ticagrelor -Troponin Trending Down and wen from 0.34 -> 0.06 -Patient underwent Coronary CTA of Chest today and read Cardiology pending as overread of Chest done by Radiology does not include include interpretation of cardiac or coronary anatomy or pathology. The coronary calcium score/coronary CTA interpretation by the Cardiologist was still pending; Per my discussion with Dr. Sallyanne Kuster, Dr. Meda Coffee felt as if was abnormal and patient to get a Cardiac Catheterization in AM -Cardiac Cath showed significant single-vessel CAD with 70% stenosis in mid LAD which was stented with Resolute Onyx 3.5 x 12 mm drug-eluting stent  Anaphylaxis w Syncope  -After multiple yellow jacket sting.   -Improved now after steroids/ benadryl in the field, and IM epi, IV Pepcid w  NS bolus here. BP's stable, no wheezing.    -Will cont IVF's at NS 75 mL/hr (because of Cath), H2 blockers/ benadryl, steroids for now. -Syncope per Cards was likely Vasovagal response  Leukocytosis -Likely reactive and in the setting of IV Steroid Demargination -WBC improved from 16.7 -> 14.6 -> 11.0 -Continue to Monitor and Repeat CBC in AM   Hypophosphatemia -Patient's Phos Level was 2.4 and improved to 2.8 -Replete with IV KPhos -Continue to Monitor and Replete as Necessary  CKD Stage 2 -At baseline; BUN/Cr went from 18/1.21 -> 15/1.06 -Continue to Monitor and Repeat CMP in AM  HLD -Check FLP in AM  -Rosuvastatin 5 mg po Daily increased to 20 mg po Daily  DVT prophylaxis: Was Anticoagulated with Heparin gtt  Code Status: FULL CODE Family Communication: Discussed with wife at bedside Disposition Plan: Anticipate D/C in next 24-48 Hours if cleared by Cardiology  Consultants:   Cardiology  Procedures:    Antimicrobials: Anti-infectives    None     Subjective: Seen and examined at bedside prior to Cath and had no issues. No nausea or vomiting. No CP or SOB.   Objective: Vitals:   05/01/17 1500 05/01/17 1515 05/01/17 1530 05/01/17 1600  BP: (!) 141/80 (!) 179/95 133/62 (!) 158/75  Pulse:      Resp: 18 17 (!) 22 19  Temp: 98.2 F (36.8 C)     TempSrc:      SpO2: 100%     Weight:      Height:        Intake/Output Summary (Last 24 hours) at  05/01/17 1610 Last data filed at 05/01/17 1516  Gross per 24 hour  Intake          2442.81 ml  Output              600 ml  Net          1842.81 ml   Filed Weights   04/28/17 2102  Weight: 102.1 kg (225 lb)   Examination: Physical Exam:  Constitutional: Pleasant Caucasian male in NAD; Appears calm and comfortable Eyes: Sclerae anicteric. Conjunctivae non-injected ENMT: Grossly normal hearing. Mucous Membranes appear moist Neck: Supple with no JVD Respiratory: CTAB; No wheezing/rales/rhonchi. Patient was not  tachypenic or using any accessory muscles to breathe Cardiovascular: RRR; S1 S2; No LE edema Abdomen: Soft, NT, ND. Bowel Sounds positive GU: Deferred Musculoskeletal: No contractures; No cyanosis Skin: Warm and Dry. No rashes or lesions on a limited skin eval Neurologic: CN 2-12 grossly intact. No appreciable Focal deficits Psychiatric: Pleasant mood and affect. Intact judgement and insight  Data Reviewed: I have personally reviewed following labs and imaging studies  CBC:  Recent Labs Lab 04/28/17 2230 04/28/17 2323 04/29/17 0719 04/29/17 2012 04/30/17 0332 05/01/17 0502  WBC 19.7*  --  14.7* 16.7* 14.6* 11.0*  NEUTROABS 16.5*  --   --   --  13.3* 9.9*  HGB 14.6 13.3 13.9 13.2 12.6* 12.4*  HCT 42.4 39.0 40.7 38.8* 38.3* 37.5*  MCV 89.8  --  90.4 92.6 92.7 93.8  PLT 96*  --  205 205 166 740   Basic Metabolic Panel:  Recent Labs Lab 04/28/17 2323 04/29/17 0845 04/30/17 0332 05/01/17 0502  NA 142 136 137 137  K 3.9 4.5 4.7 4.1  CL 104 106 108 110  CO2  --  22 25 22   GLUCOSE 151* 138* 162* 156*  BUN 16 11 18 15   CREATININE 1.30* 1.33* 1.21 1.06  CALCIUM  --  8.8* 8.6* 8.4*  MG  --  1.8 2.2 2.1  PHOS  --  2.3* 2.4* 2.8   GFR: Estimated Creatinine Clearance: 88.4 mL/min (by C-G formula based on SCr of 1.06 mg/dL). Liver Function Tests:  Recent Labs Lab 04/30/17 0332 05/01/17 0502  AST 35 27  ALT 15* 15*  ALKPHOS 41 36*  BILITOT 0.5 0.4  PROT 6.1* 5.8*  ALBUMIN 3.7 3.6   No results for input(s): LIPASE, AMYLASE in the last 168 hours. No results for input(s): AMMONIA in the last 168 hours. Coagulation Profile:  Recent Labs Lab 05/01/17 0502  INR 1.08   Cardiac Enzymes:  Recent Labs Lab 04/29/17 0845 04/29/17 1437 04/29/17 2012  TROPONINI 0.17* 0.08* 0.06*   BNP (last 3 results) No results for input(s): PROBNP in the last 8760 hours. HbA1C: No results for input(s): HGBA1C in the last 72 hours. CBG: No results for input(s): GLUCAP in the  last 168 hours. Lipid Profile: No results for input(s): CHOL, HDL, LDLCALC, TRIG, CHOLHDL, LDLDIRECT in the last 72 hours. Thyroid Function Tests: No results for input(s): TSH, T4TOTAL, FREET4, T3FREE, THYROIDAB in the last 72 hours. Anemia Panel: No results for input(s): VITAMINB12, FOLATE, FERRITIN, TIBC, IRON, RETICCTPCT in the last 72 hours. Sepsis Labs: No results for input(s): PROCALCITON, LATICACIDVEN in the last 168 hours.  No results found for this or any previous visit (from the past 240 hour(s)).   Radiology Studies: Ct Coronary Morph W/cta Cor W/score W/ca W/cm &/or Wo/cm  Addendum Date: 04/30/2017   ADDENDUM REPORT: 04/30/2017 18:27 CLINICAL DATA:  65 -year-old male  with troponin elevation in the settings of anaphylactic shock. EXAM: Cardiac/Coronary  CT TECHNIQUE: The patient was scanned on a [employer]. FINDINGS: A 120 kV prospective scan was triggered in the descending thoracic aorta at 111 HU's. Axial non-contrast 3 mm slices were carried out through the heart. The data set was analyzed on a dedicated work station and scored using the [place]. Gantry rotation speed was 250 msecs and collimation was .6 mm. No beta blockade and 0.8 mg of sl NTG was given. The 3D data set was reconstructed in 5% intervals of the 67-82 % of the R-R cycle. Diastolic phases were analyzed on a dedicated work station using MPR, MIP and VRT modes. The patient received 80 cc of contrast. Aorta:  Normal size.  No calcifications.  No dissection. Aortic Valve:  Trileaflet.  No calcifications. Coronary Arteries:  Normal coronary origin.  Right dominance. RCA is a large dominant artery that gives rise to PDA and PLVB. There is no plaque. Left main is a very large artery that gives rise to LAD, ramus intermedius and LCX arteries. LAD is a large vessel that gives rise to one large diagonal branch. There is minimal eccentric calcified plaque in the proximal LAD with associated stenosis 0-25%.  Proximal to mid LAD has moderate noncalcified plaque just prior to the D1 takeoff with associated stenosis 50-69%. RI is a small artery that has no plaque. LCX is a non-dominant artery that gives rise to one OM1 branch. There is no plaque. Other findings: Normal pulmonary vein drainage into the left atrium. Normal let atrial appendage without a thrombus. Normal size of the pulmonary artery. IMPRESSION: 1. Normal coronary origin with right dominance. 2. There is a moderate noncalcified plaque in the proximal to mid LAD, just prior to the D1 takeoff with associated stenosis 50-69%. Additional analysis with CT FFR will be submitted. Ena Dawley Electronically Signed   By: Ena Dawley   On: 04/30/2017 18:27   Result Date: 04/30/2017 EXAM: OVER-READ INTERPRETATION  CT CHEST The following report is an over-read performed by radiologist Dr. Rebekah Chesterfield [facility] on 04/30/2017. This over-read does not include interpretation of cardiac or coronary anatomy or pathology. The coronary calcium score/coronary CTA interpretation by the cardiologist is attached. COMPARISON:  None. FINDINGS: Mild multifocal linear scarring in the lower lobes of the lungs bilaterally. Within the visualized portions of the thorax there are no suspicious appearing pulmonary nodules or masses, there is no acute consolidative airspace disease, no pleural effusions, no pneumothorax and no lymphadenopathy. Visualized portions of the upper abdomen are unremarkable. There are no aggressive appearing lytic or blastic lesions noted in the visualized portions of the skeleton. IMPRESSION: 1. No significant incidental noncardiac findings are noted. Electronically Signed: By: Vinnie Langton M.D. On: 04/30/2017 15:40   Ct Coronary Fractional Flow Reserve Data Prep  Addendum Date: 04/30/2017   ADDENDUM REPORT: 04/30/2017 18:27 CLINICAL DATA:  81 -year-old male with troponin elevation in the settings of anaphylactic shock. EXAM:  Cardiac/Coronary  CT TECHNIQUE: The patient was scanned on a [employer]. FINDINGS: A 120 kV prospective scan was triggered in the descending thoracic aorta at 111 HU's. Axial non-contrast 3 mm slices were carried out through the heart. The data set was analyzed on a dedicated work station and scored using the [place]. Gantry rotation speed was 250 msecs and collimation was .6 mm. No beta blockade and 0.8 mg of sl NTG was given. The 3D data set was reconstructed in 5%  intervals of the 67-82 % of the R-R cycle. Diastolic phases were analyzed on a dedicated work station using MPR, MIP and VRT modes. The patient received 80 cc of contrast. Aorta:  Normal size.  No calcifications.  No dissection. Aortic Valve:  Trileaflet.  No calcifications. Coronary Arteries:  Normal coronary origin.  Right dominance. RCA is a large dominant artery that gives rise to PDA and PLVB. There is no plaque. Left main is a very large artery that gives rise to LAD, ramus intermedius and LCX arteries. LAD is a large vessel that gives rise to one large diagonal branch. There is minimal eccentric calcified plaque in the proximal LAD with associated stenosis 0-25%. Proximal to mid LAD has moderate noncalcified plaque just prior to the D1 takeoff with associated stenosis 50-69%. RI is a small artery that has no plaque. LCX is a non-dominant artery that gives rise to one OM1 branch. There is no plaque. Other findings: Normal pulmonary vein drainage into the left atrium. Normal let atrial appendage without a thrombus. Normal size of the pulmonary artery. IMPRESSION: 1. Normal coronary origin with right dominance. 2. There is a moderate noncalcified plaque in the proximal to mid LAD, just prior to the D1 takeoff with associated stenosis 50-69%. Additional analysis with CT FFR will be submitted. Ena Dawley Electronically Signed   By: Ena Dawley   On: 04/30/2017 18:27   Result Date: 04/30/2017 EXAM: OVER-READ  INTERPRETATION  CT CHEST The following report is an over-read performed by radiologist Dr. Rebekah Chesterfield [facility] on 04/30/2017. This over-read does not include interpretation of cardiac or coronary anatomy or pathology. The coronary calcium score/coronary CTA interpretation by the cardiologist is attached. COMPARISON:  None. FINDINGS: Mild multifocal linear scarring in the lower lobes of the lungs bilaterally. Within the visualized portions of the thorax there are no suspicious appearing pulmonary nodules or masses, there is no acute consolidative airspace disease, no pleural effusions, no pneumothorax and no lymphadenopathy. Visualized portions of the upper abdomen are unremarkable. There are no aggressive appearing lytic or blastic lesions noted in the visualized portions of the skeleton. IMPRESSION: 1. No significant incidental noncardiac findings are noted. Electronically Signed: By: Vinnie Langton M.D. On: 04/30/2017 15:40   Ct Coronary Fractional Flow Reserve Fluid Analysis  Addendum Date: 04/30/2017   ADDENDUM REPORT: 04/30/2017 18:27 CLINICAL DATA:  58 -year-old male with troponin elevation in the settings of anaphylactic shock. EXAM: Cardiac/Coronary  CT TECHNIQUE: The patient was scanned on a [employer]. FINDINGS: A 120 kV prospective scan was triggered in the descending thoracic aorta at 111 HU's. Axial non-contrast 3 mm slices were carried out through the heart. The data set was analyzed on a dedicated work station and scored using the [place]. Gantry rotation speed was 250 msecs and collimation was .6 mm. No beta blockade and 0.8 mg of sl NTG was given. The 3D data set was reconstructed in 5% intervals of the 67-82 % of the R-R cycle. Diastolic phases were analyzed on a dedicated work station using MPR, MIP and VRT modes. The patient received 80 cc of contrast. Aorta:  Normal size.  No calcifications.  No dissection. Aortic Valve:  Trileaflet.  No  calcifications. Coronary Arteries:  Normal coronary origin.  Right dominance. RCA is a large dominant artery that gives rise to PDA and PLVB. There is no plaque. Left main is a very large artery that gives rise to LAD, ramus intermedius and LCX arteries. LAD is a large vessel  that gives rise to one large diagonal branch. There is minimal eccentric calcified plaque in the proximal LAD with associated stenosis 0-25%. Proximal to mid LAD has moderate noncalcified plaque just prior to the D1 takeoff with associated stenosis 50-69%. RI is a small artery that has no plaque. LCX is a non-dominant artery that gives rise to one OM1 branch. There is no plaque. Other findings: Normal pulmonary vein drainage into the left atrium. Normal let atrial appendage without a thrombus. Normal size of the pulmonary artery. IMPRESSION: 1. Normal coronary origin with right dominance. 2. There is a moderate noncalcified plaque in the proximal to mid LAD, just prior to the D1 takeoff with associated stenosis 50-69%. Additional analysis with CT FFR will be submitted. Ena Dawley Electronically Signed   By: Ena Dawley   On: 04/30/2017 18:27   Result Date: 04/30/2017 EXAM: OVER-READ INTERPRETATION  CT CHEST The following report is an over-read performed by radiologist Dr. Rebekah Chesterfield [facility] on 04/30/2017. This over-read does not include interpretation of cardiac or coronary anatomy or pathology. The coronary calcium score/coronary CTA interpretation by the cardiologist is attached. COMPARISON:  None. FINDINGS: Mild multifocal linear scarring in the lower lobes of the lungs bilaterally. Within the visualized portions of the thorax there are no suspicious appearing pulmonary nodules or masses, there is no acute consolidative airspace disease, no pleural effusions, no pneumothorax and no lymphadenopathy. Visualized portions of the upper abdomen are unremarkable. There are no aggressive appearing lytic or blastic  lesions noted in the visualized portions of the skeleton. IMPRESSION: 1. No significant incidental noncardiac findings are noted. Electronically Signed: By: Vinnie Langton M.D. On: 04/30/2017 15:40   Scheduled Meds: . [START ON 05/02/2017] aspirin EC  81 mg Oral Daily  . [START ON 05/02/2017] enoxaparin (LOVENOX) injection  40 mg Subcutaneous Q24H  . methylPREDNISolone (SOLU-MEDROL) injection  20 mg Intravenous Q12H  . [START ON 05/02/2017] rosuvastatin  20 mg Oral Daily  . sodium chloride flush  3 mL Intravenous Q12H  . ticagrelor  90 mg Oral BID  . traZODone  100 mg Oral QHS   Continuous Infusions: . sodium chloride 75 mL/hr at 05/01/17 1500  . sodium chloride      LOS: 1 day   Kerney Elbe, DO [facility] Pager [phone]  If 7PM-7AM, please contact night-coverage [email or web address] Password [email or web address] 05/01/2017, 4:10 PM

## 2017-05-01 NOTE — Progress Notes (Signed)
Progress Note  Patient Name: Travis Jenkins Date of Encounter: 05/01/2017  Primary Cardiologist: Hilty  Subjective   No further chest symptoms overnight  Inpatient Medications    Scheduled Meds: . [MAR Hold] methylPREDNISolone (SOLU-MEDROL) injection  20 mg Intravenous Q12H  . [MAR Hold] rosuvastatin  5 mg Oral Daily  . sodium chloride flush  3 mL Intravenous Q12H  . [MAR Hold] traZODone  100 mg Oral QHS   Continuous Infusions: . sodium chloride Stopped (04/30/17 2154)  . sodium chloride    . sodium chloride 1 mL/kg/hr (05/01/17 0813)  . heparin Stopped (05/01/17 1149)  . heparin     PRN Meds: sodium chloride, [MAR Hold] acetaminophen **OR** [MAR Hold] acetaminophen, fentaNYL, heparin, heparin, lidocaine (PF), midazolam, [MAR Hold] ondansetron **OR** [MAR Hold] ondansetron (ZOFRAN) IV, [MAR Hold] polyethylene glycol, Radial Cocktail/Verapamil only, sodium chloride flush   Vital Signs    Vitals:   04/30/17 2127 05/01/17 0639 05/01/17 1053 05/01/17 1229  BP: 136/71 131/82 (!) 145/82   Pulse: 60 (!) 56 (!) 48   Resp: 18 18 18    Temp: 98 F (36.7 C) 97.8 F (36.6 C) (!) 97.5 F (36.4 C)   TempSrc: Oral Oral Oral   SpO2: 96% 96% 96% 100%  Weight:      Height:        Intake/Output Summary (Last 24 hours) at 05/01/17 1253 Last data filed at 05/01/17 1153  Gross per 24 hour  Intake          3144.81 ml  Output              600 ml  Net          2544.81 ml   Filed Weights   04/28/17 2102  Weight: 225 lb (102.1 kg)    Telemetry    Normal sinus rhythm - Personally Reviewed  ECG    No new tracing - Personally Reviewed  Physical Exam  Comfortable, relaxed GEN: No acute distress.   Neck: No JVD Cardiac: RRR, no murmurs, rubs, or gallops.  Respiratory: Clear to auscultation bilaterally. GI: Soft, nontender, non-distended  MS: No edema; No deformity. Neuro:  Nonfocal  Psych: Normal affect   Labs    Chemistry Recent Labs Lab 04/29/17 0845  04/30/17 0332 05/01/17 0502  NA 136 137 137  K 4.5 4.7 4.1  CL 106 108 110  CO2 22 25 22   GLUCOSE 138* 162* 156*  BUN 11 18 15   CREATININE 1.33* 1.21 1.06  CALCIUM 8.8* 8.6* 8.4*  PROT  --  6.1* 5.8*  ALBUMIN  --  3.7 3.6  AST  --  35 27  ALT  --  15* 15*  ALKPHOS  --  41 36*  BILITOT  --  0.5 0.4  GFRNONAA 55* >60 >60  GFRAA >60 >60 >60  ANIONGAP 8 4* 5     Hematology Recent Labs Lab 04/29/17 2012 04/30/17 0332 05/01/17 0502  WBC 16.7* 14.6* 11.0*  RBC 4.19* 4.13* 4.00*  HGB 13.2 12.6* 12.4*  HCT 38.8* 38.3* 37.5*  MCV 92.6 92.7 93.8  MCH 31.5 30.5 31.0  MCHC 34.0 32.9 33.1  RDW 12.8 12.9 12.9  PLT 205 166 180    Cardiac Enzymes Recent Labs Lab 04/29/17 0845 04/29/17 1437 04/29/17 2012  TROPONINI 0.17* 0.08* 0.06*    Recent Labs Lab 04/28/17 2322  TROPIPOC 0.34*     BNPNo results for input(s): BNP, PROBNP in the last 168 hours.   DDimer No results for input(s):  DDIMER in the last 168 hours.   Radiology    Ct Coronary Morph W/cta Cor W/score W/ca W/cm &/or Wo/cm  Addendum Date: 04/30/2017   ADDENDUM REPORT: 04/30/2017 18:27 CLINICAL DATA:  66 -year-old male with troponin elevation in the settings of anaphylactic shock. EXAM: Cardiac/Coronary  CT TECHNIQUE: The patient was scanned on a [employer]. FINDINGS: A 120 kV prospective scan was triggered in the descending thoracic aorta at 111 HU's. Axial non-contrast 3 mm slices were carried out through the heart. The data set was analyzed on a dedicated work station and scored using the [employer]. Gantry rotation speed was 250 msecs and collimation was .6 mm. No beta blockade and 0.8 mg of sl NTG was given. The 3D data set was reconstructed in 5% intervals of the 67-82 % of the R-R cycle. Diastolic phases were analyzed on a dedicated work station using MPR, MIP and VRT modes. The patient received 80 cc of contrast. Aorta:  Normal size.  No calcifications.  No dissection. Aortic Valve:   Trileaflet.  No calcifications. Coronary Arteries:  Normal coronary origin.  Right dominance. RCA is a large dominant artery that gives rise to PDA and PLVB. There is no plaque. Left main is a very large artery that gives rise to LAD, ramus intermedius and LCX arteries. LAD is a large vessel that gives rise to one large diagonal branch. There is minimal eccentric calcified plaque in the proximal LAD with associated stenosis 0-25%. Proximal to mid LAD has moderate noncalcified plaque just prior to the D1 takeoff with associated stenosis 50-69%. RI is a small artery that has no plaque. LCX is a non-dominant artery that gives rise to one OM1 branch. There is no plaque. Other findings: Normal pulmonary vein drainage into the left atrium. Normal let atrial appendage without a thrombus. Normal size of the pulmonary artery. IMPRESSION: 1. Normal coronary origin with right dominance. 2. There is a moderate noncalcified plaque in the proximal to mid LAD, just prior to the D1 takeoff with associated stenosis 50-69%. Additional analysis with CT FFR will be submitted. Ena Dawley Electronically Signed   By: Ena Dawley   On: 04/30/2017 18:27   Result Date: 04/30/2017 EXAM: OVER-READ INTERPRETATION  CT CHEST The following report is an over-read performed by radiologist Dr. Rebekah Chesterfield [facility] on 04/30/2017. This over-read does not include interpretation of cardiac or coronary anatomy or pathology. The coronary calcium score/coronary CTA interpretation by the cardiologist is attached. COMPARISON:  None. FINDINGS: Mild multifocal linear scarring in the lower lobes of the lungs bilaterally. Within the visualized portions of the thorax there are no suspicious appearing pulmonary nodules or masses, there is no acute consolidative airspace disease, no pleural effusions, no pneumothorax and no lymphadenopathy. Visualized portions of the upper abdomen are unremarkable. There are no aggressive appearing  lytic or blastic lesions noted in the visualized portions of the skeleton. IMPRESSION: 1. No significant incidental noncardiac findings are noted. Electronically Signed: By: Vinnie Langton M.D. On: 04/30/2017 15:40   Ct Coronary Fractional Flow Reserve Data Prep  Addendum Date: 04/30/2017   ADDENDUM REPORT: 04/30/2017 18:27 CLINICAL DATA:  11 -year-old male with troponin elevation in the settings of anaphylactic shock. EXAM: Cardiac/Coronary  CT TECHNIQUE: The patient was scanned on a [employer]. FINDINGS: A 120 kV prospective scan was triggered in the descending thoracic aorta at 111 HU's. Axial non-contrast 3 mm slices were carried out through the heart. The data set was analyzed on a dedicated  work station and scored using the [employer]. Gantry rotation speed was 250 msecs and collimation was .6 mm. No beta blockade and 0.8 mg of sl NTG was given. The 3D data set was reconstructed in 5% intervals of the 67-82 % of the R-R cycle. Diastolic phases were analyzed on a dedicated work station using MPR, MIP and VRT modes. The patient received 80 cc of contrast. Aorta:  Normal size.  No calcifications.  No dissection. Aortic Valve:  Trileaflet.  No calcifications. Coronary Arteries:  Normal coronary origin.  Right dominance. RCA is a large dominant artery that gives rise to PDA and PLVB. There is no plaque. Left main is a very large artery that gives rise to LAD, ramus intermedius and LCX arteries. LAD is a large vessel that gives rise to one large diagonal branch. There is minimal eccentric calcified plaque in the proximal LAD with associated stenosis 0-25%. Proximal to mid LAD has moderate noncalcified plaque just prior to the D1 takeoff with associated stenosis 50-69%. RI is a small artery that has no plaque. LCX is a non-dominant artery that gives rise to one OM1 branch. There is no plaque. Other findings: Normal pulmonary vein drainage into the left atrium. Normal let atrial appendage without  a thrombus. Normal size of the pulmonary artery. IMPRESSION: 1. Normal coronary origin with right dominance. 2. There is a moderate noncalcified plaque in the proximal to mid LAD, just prior to the D1 takeoff with associated stenosis 50-69%. Additional analysis with CT FFR will be submitted. Ena Dawley Electronically Signed   By: Ena Dawley   On: 04/30/2017 18:27   Result Date: 04/30/2017 EXAM: OVER-READ INTERPRETATION  CT CHEST The following report is an over-read performed by radiologist Dr. Rebekah Chesterfield [facility] on 04/30/2017. This over-read does not include interpretation of cardiac or coronary anatomy or pathology. The coronary calcium score/coronary CTA interpretation by the cardiologist is attached. COMPARISON:  None. FINDINGS: Mild multifocal linear scarring in the lower lobes of the lungs bilaterally. Within the visualized portions of the thorax there are no suspicious appearing pulmonary nodules or masses, there is no acute consolidative airspace disease, no pleural effusions, no pneumothorax and no lymphadenopathy. Visualized portions of the upper abdomen are unremarkable. There are no aggressive appearing lytic or blastic lesions noted in the visualized portions of the skeleton. IMPRESSION: 1. No significant incidental noncardiac findings are noted. Electronically Signed: By: Vinnie Langton M.D. On: 04/30/2017 15:40   Ct Coronary Fractional Flow Reserve Fluid Analysis  Addendum Date: 04/30/2017   ADDENDUM REPORT: 04/30/2017 18:27 CLINICAL DATA:  59 -year-old male with troponin elevation in the settings of anaphylactic shock. EXAM: Cardiac/Coronary  CT TECHNIQUE: The patient was scanned on a [employer]. FINDINGS: A 120 kV prospective scan was triggered in the descending thoracic aorta at 111 HU's. Axial non-contrast 3 mm slices were carried out through the heart. The data set was analyzed on a dedicated work station and scored using the [employer].  Gantry rotation speed was 250 msecs and collimation was .6 mm. No beta blockade and 0.8 mg of sl NTG was given. The 3D data set was reconstructed in 5% intervals of the 67-82 % of the R-R cycle. Diastolic phases were analyzed on a dedicated work station using MPR, MIP and VRT modes. The patient received 80 cc of contrast. Aorta:  Normal size.  No calcifications.  No dissection. Aortic Valve:  Trileaflet.  No calcifications. Coronary Arteries:  Normal coronary origin.  Right dominance. RCA  is a large dominant artery that gives rise to PDA and PLVB. There is no plaque. Left main is a very large artery that gives rise to LAD, ramus intermedius and LCX arteries. LAD is a large vessel that gives rise to one large diagonal branch. There is minimal eccentric calcified plaque in the proximal LAD with associated stenosis 0-25%. Proximal to mid LAD has moderate noncalcified plaque just prior to the D1 takeoff with associated stenosis 50-69%. RI is a small artery that has no plaque. LCX is a non-dominant artery that gives rise to one OM1 branch. There is no plaque. Other findings: Normal pulmonary vein drainage into the left atrium. Normal let atrial appendage without a thrombus. Normal size of the pulmonary artery. IMPRESSION: 1. Normal coronary origin with right dominance. 2. There is a moderate noncalcified plaque in the proximal to mid LAD, just prior to the D1 takeoff with associated stenosis 50-69%. Additional analysis with CT FFR will be submitted. Ena Dawley Electronically Signed   By: Ena Dawley   On: 04/30/2017 18:27   Result Date: 04/30/2017 EXAM: OVER-READ INTERPRETATION  CT CHEST The following report is an over-read performed by radiologist Dr. Rebekah Chesterfield [facility] on 04/30/2017. This over-read does not include interpretation of cardiac or coronary anatomy or pathology. The coronary calcium score/coronary CTA interpretation by the cardiologist is attached. COMPARISON:  None.  FINDINGS: Mild multifocal linear scarring in the lower lobes of the lungs bilaterally. Within the visualized portions of the thorax there are no suspicious appearing pulmonary nodules or masses, there is no acute consolidative airspace disease, no pleural effusions, no pneumothorax and no lymphadenopathy. Visualized portions of the upper abdomen are unremarkable. There are no aggressive appearing lytic or blastic lesions noted in the visualized portions of the skeleton. IMPRESSION: 1. No significant incidental noncardiac findings are noted. Electronically Signed: By: Vinnie Langton M.D. On: 04/30/2017 15:40    Cardiac Studies   Moderate severity proximal LAD stenosis at the diagonal bifurcation on CT angiography. FFR pending and will not be available in the near future due to technical issues  Patient Profile     66 y.o. male with atypical chest pain and recent syncope following insect stings, with mild cardiac troponin elevation and intermediate severity stenosis in the proximal LAD on CT coronary angiography.  Assessment & Plan    Proceed with coronary angiography and potential revascularization with angioplasty/stent today. This procedure has been fully reviewed with the patient and written informed consent has been obtained. Syncopal event could well be explained by a vasovagal response. Profound hypotension could be the cause for his elevation in cardiac enzymes.   For questions or updates, please contact [place] Please consult [email or web address] for contact info under Cardiology/STEMI.      Signed, Sanda Klein, MD  05/01/2017, 12:53 PM

## 2017-05-01 NOTE — H&P (View-Only) (Signed)
Progress Note  Patient Name: Travis Jenkins Date of Encounter: 04/30/2017  Primary Cardiologist: Bronson Ing  Subjective   Had some positional left chest pain during CT, but nothing that sounds anginal. Coronary CTA is concerning for proximal LAD stenosis at the first diagonal bifurcation.  Inpatient Medications    Scheduled Meds: . methylPREDNISolone (SOLU-MEDROL) injection  20 mg Intravenous Q12H  . nitroGLYCERIN      . rosuvastatin  5 mg Oral Daily  . traZODone  100 mg Oral QHS   Continuous Infusions: . sodium chloride 1,000 mL (04/29/17 1933)  . heparin 1,200 Units/hr (04/29/17 1705)   PRN Meds: acetaminophen **OR** acetaminophen, ondansetron **OR** ondansetron (ZOFRAN) IV, polyethylene glycol   Vital Signs    Vitals:   04/29/17 1600 04/29/17 2210 04/30/17 0546 04/30/17 0856  BP: 116/68 122/69 122/67 138/73  Pulse: (!) 58 67 63 (!) 53  Resp: 20 18 18 18   Temp:  97.8 F (36.6 C) 98.3 F (36.8 C) 97.7 F (36.5 C)  TempSrc:  Oral Oral Oral  SpO2: 93% 99% 96% 96%  Weight:      Height:        Intake/Output Summary (Last 24 hours) at 04/30/17 1824 Last data filed at 04/30/17 1700  Gross per 24 hour  Intake          4442.09 ml  Output             1700 ml  Net          2742.09 ml   Filed Weights   04/28/17 2102  Weight: 225 lb (102.1 kg)    Telemetry    NSR - Personally Reviewed  ECG    No new tracing - Personally Reviewed  Physical Exam  Calm, rleaxed GEN: No acute distress.   Neck: No JVD Cardiac: RRR, no murmurs, rubs, or gallops.  Respiratory: Clear to auscultation bilaterally. GI: Soft, nontender, non-distended  MS: No edema; No deformity. Neuro:  Nonfocal  Psych: Normal affect   Labs    Chemistry Recent Labs Lab 04/28/17 2323 04/29/17 0845 04/30/17 0332  NA 142 136 137  K 3.9 4.5 4.7  CL 104 106 108  CO2  --  22 25  GLUCOSE 151* 138* 162*  BUN 16 11 18   CREATININE 1.30* 1.33* 1.21  CALCIUM  --  8.8* 8.6*  PROT  --   --   6.1*  ALBUMIN  --   --  3.7  AST  --   --  35  ALT  --   --  15*  ALKPHOS  --   --  41  BILITOT  --   --  0.5  GFRNONAA  --  55* >60  GFRAA  --  >60 >60  ANIONGAP  --  8 4*     Hematology Recent Labs Lab 04/29/17 0719 04/29/17 2012 04/30/17 0332  WBC 14.7* 16.7* 14.6*  RBC 4.50 4.19* 4.13*  HGB 13.9 13.2 12.6*  HCT 40.7 38.8* 38.3*  MCV 90.4 92.6 92.7  MCH 30.9 31.5 30.5  MCHC 34.2 34.0 32.9  RDW 12.6 12.8 12.9  PLT 205 205 166    Cardiac Enzymes Recent Labs Lab 04/29/17 0845 04/29/17 1437 04/29/17 2012  TROPONINI 0.17* 0.08* 0.06*    Recent Labs Lab 04/28/17 2322  TROPIPOC 0.34*     BNPNo results for input(s): BNP, PROBNP in the last 168 hours.   DDimer No results for input(s): DDIMER in the last 168 hours.   Radiology  Ct Coronary Morph W/cta Cor W/score W/ca W/cm &/or Wo/cm  Result Date: 04/30/2017 EXAM: OVER-READ INTERPRETATION  CT CHEST The following report is an over-read performed by radiologist Dr. Rebekah Chesterfield [facility] on 04/30/2017. This over-read does not include interpretation of cardiac or coronary anatomy or pathology. The coronary calcium score/coronary CTA interpretation by the cardiologist is attached. COMPARISON:  None. FINDINGS: Mild multifocal linear scarring in the lower lobes of the lungs bilaterally. Within the visualized portions of the thorax there are no suspicious appearing pulmonary nodules or masses, there is no acute consolidative airspace disease, no pleural effusions, no pneumothorax and no lymphadenopathy. Visualized portions of the upper abdomen are unremarkable. There are no aggressive appearing lytic or blastic lesions noted in the visualized portions of the skeleton. IMPRESSION: 1. No significant incidental noncardiac findings are noted. Electronically Signed   By: Vinnie Langton M.D.   On: 04/30/2017 15:40   Dg Chest Port 1 View  Result Date: 04/28/2017 CLINICAL DATA:  Initial evaluation for acute  allergic reaction. EXAM: PORTABLE CHEST 1 VIEW COMPARISON:  None available. FINDINGS: Transverse heart size at the upper limits of normal. Mediastinal silhouette normal. Lungs mildly hypoinflated. No focal infiltrates. No pulmonary edema or pleural effusion. No pneumothorax. No acute osseous abnormality. Degenerative changes noted within the visualized spine. IMPRESSION: No active cardiopulmonary disease. Electronically Signed   By: Jeannine Boga M.D.   On: 04/28/2017 22:24   Ct Coronary Fractional Flow Reserve Data Prep  Result Date: 04/30/2017 EXAM: OVER-READ INTERPRETATION  CT CHEST The following report is an over-read performed by radiologist Dr. Rebekah Chesterfield [facility] on 04/30/2017. This over-read does not include interpretation of cardiac or coronary anatomy or pathology. The coronary calcium score/coronary CTA interpretation by the cardiologist is attached. COMPARISON:  None. FINDINGS: Mild multifocal linear scarring in the lower lobes of the lungs bilaterally. Within the visualized portions of the thorax there are no suspicious appearing pulmonary nodules or masses, there is no acute consolidative airspace disease, no pleural effusions, no pneumothorax and no lymphadenopathy. Visualized portions of the upper abdomen are unremarkable. There are no aggressive appearing lytic or blastic lesions noted in the visualized portions of the skeleton. IMPRESSION: 1. No significant incidental noncardiac findings are noted. Electronically Signed   By: Vinnie Langton M.D.   On: 04/30/2017 15:40   Ct Coronary Fractional Flow Reserve Fluid Analysis  Result Date: 04/30/2017 EXAM: OVER-READ INTERPRETATION  CT CHEST The following report is an over-read performed by radiologist Dr. Rebekah Chesterfield [facility] on 04/30/2017. This over-read does not include interpretation of cardiac or coronary anatomy or pathology. The coronary calcium score/coronary CTA interpretation by the  cardiologist is attached. COMPARISON:  None. FINDINGS: Mild multifocal linear scarring in the lower lobes of the lungs bilaterally. Within the visualized portions of the thorax there are no suspicious appearing pulmonary nodules or masses, there is no acute consolidative airspace disease, no pleural effusions, no pneumothorax and no lymphadenopathy. Visualized portions of the upper abdomen are unremarkable. There are no aggressive appearing lytic or blastic lesions noted in the visualized portions of the skeleton. IMPRESSION: 1. No significant incidental noncardiac findings are noted. Electronically Signed   By: Vinnie Langton M.D.   On: 04/30/2017 15:40    Cardiac Studies   Preliminary report of intermediate severity stenosis in the proximal LAD at the diagonal bifurcation.  Patient Profile     66 y.o. male with history of hyperlipidemia who presented to the hospital with hypotension/syncope after being stung 4-5  times by yellow jackets. He has no history of MI. We were asked to consult for elevated troponin of 0.34.  Assessment & Plan    Coronary CTA is concerning for possible LAD stenosis. FFR will not be available until AM. Schedule for cardiac cath tomorrow. This procedure (and possible angioplasty/stent) has been fully reviewed with the patient and written informed consent has been obtained. (If FFR is normal we have the option to cancel cath and treat medically).  For questions or updates, please contact [place] Please consult [email or web address] for contact info under Cardiology/STEMI.      Signed, Sanda Klein, MD  04/30/2017, 6:24 PM

## 2017-05-01 NOTE — Interval H&P Note (Signed)
History and Physical Interval Note:  05/01/2017 12:36 PM  Travis Jenkins  has presented today for cardiac catheterization, with the diagnosis of abnormal coronary CT and elevated troponin. The various methods of treatment have been discussed with the patient and family. After consideration of risks, benefits and other options for treatment, the patient has consented to  Procedure(s): LEFT HEART CATH AND CORONARY ANGIOGRAPHY (N/A) as a surgical intervention .  The patient's history has been reviewed, patient examined, no change in status, stable for surgery.  I have reviewed the patient's chart and labs.  Questions were answered to the patient's satisfaction.    Cath Lab Visit (complete for each Cath Lab visit)  Clinical Evaluation Leading to the Procedure:   ACS: Yes.    Non-ACS: N/A  Maryhelen Lindler

## 2017-05-01 NOTE — Brief Op Note (Addendum)
BRIEF CARDIAC CATHETERIZATION NOTE  Date: 05/01/2017 Time: 2:35 PM  PATIENT:  Travis Jenkins  66 y.o. male  PRE-OPERATIVE DIAGNOSIS: NSTEMI, abnormal coronary CT  POST-OPERATIVE DIAGNOSIS:  Same  PROCEDURE:  Procedure(s): LEFT HEART CATH AND CORONARY ANGIOGRAPHY (N/A) INTRAVASCULAR PRESSURE WIRE/FFR STUDY (N/A) CORONARY STENT INTERVENTION (N/A)  SURGEON:  Surgeon(s) and Role:    Nelva Bush, MD - Primary  FINDINGS: 1. Significant single-vessel coronary artery disease with discrete 70% stenosis in mid LAD at takeoff of moderate-caliber D2 branch (FFR 0.76). 2. Mildly elevated left ventricular filling pressure. 3. Normal left ventricular contraction. 4. Successful PCI to mid LAD with placement of Resolute Onyx 3.5 x 12 mm drug-eluting stent. PTCA at ostium of D2 was performed due to plaque shift following stent placement in the mid LAD.  RECOMMENDATIONS: 1. Dual antiplatelet therapy with aspirin and ticagrelor for at least 12 months. 2. Aggressive secondary prevention including escalation of rosuvastatin to 20 mg daily. 3. Fasting lipid panel in a.m. 4. Anticipate discharge tomorrow cardiac standpoint.  Nelva Bush, MD [facility] Pager: [phone]

## 2017-05-01 NOTE — Progress Notes (Signed)
.   S/W STEPHANIE @ [facility] # [phone]    1. BRILINTA 90 MG BIS   COVER- YES  CO-PAY- $ 100.00- 100 % OF TOTAL COAST  TIER- 3 DRUG  PRIOR APPROVAL- NO  DEDUCTIBLE - NO  PHARMACY : ANY RETAIL

## 2017-05-01 NOTE — Progress Notes (Signed)
Chaplain went to room and patient is having procedure.  Chaplain found patient's wife at waiting room and she said they will get the AD completed at some time.  She works at [employer] for [employer] and said that they will get the Ad completed at a later date.      05/01/17 1317  Clinical Encounter Type  Visited With Family;Patient not available  Visit Type Initial;Psychological support;Spiritual support;Social support (Scientist, physiological)

## 2017-05-02 DIAGNOSIS — I25110 Atherosclerotic heart disease of native coronary artery with unstable angina pectoris: Secondary | ICD-10-CM

## 2017-05-02 LAB — CBC WITH DIFFERENTIAL/PLATELET
Basophils Absolute: 0 10*3/uL (ref 0.0–0.1)
Basophils Relative: 0 %
Eosinophils Absolute: 0 10*3/uL (ref 0.0–0.7)
Eosinophils Relative: 0 %
HCT: 38.8 % — ABNORMAL LOW (ref 39.0–52.0)
Hemoglobin: 13.3 g/dL (ref 13.0–17.0)
Lymphocytes Relative: 7 %
Lymphs Abs: 0.8 10*3/uL (ref 0.7–4.0)
MCH: 31.3 pg (ref 26.0–34.0)
MCHC: 34.3 g/dL (ref 30.0–36.0)
MCV: 91.3 fL (ref 78.0–100.0)
Monocytes Absolute: 0.5 10*3/uL (ref 0.1–1.0)
Monocytes Relative: 4 %
Neutro Abs: 9.7 10*3/uL — ABNORMAL HIGH (ref 1.7–7.7)
Neutrophils Relative %: 89 %
Platelets: 195 10*3/uL (ref 150–400)
RBC: 4.25 MIL/uL (ref 4.22–5.81)
RDW: 12.8 % (ref 11.5–15.5)
WBC: 10.9 10*3/uL — ABNORMAL HIGH (ref 4.0–10.5)

## 2017-05-02 LAB — LIPID PANEL
Cholesterol: 132 mg/dL (ref 0–200)
HDL: 46 mg/dL (ref >40)
LDL Cholesterol: 64 mg/dL (ref 0–99)
Total CHOL/HDL Ratio: 2.9 RATIO
Triglycerides: 108 mg/dL (ref <150)
VLDL: 22 mg/dL (ref 0–40)

## 2017-05-02 LAB — COMPREHENSIVE METABOLIC PANEL
ALT: 17 U/L (ref 17–63)
AST: 26 U/L (ref 15–41)
Albumin: 3.6 g/dL (ref 3.5–5.0)
Alkaline Phosphatase: 37 U/L — ABNORMAL LOW (ref 38–126)
Anion gap: 5 (ref 5–15)
BUN: 15 mg/dL (ref 6–20)
CO2: 24 mmol/L (ref 22–32)
Calcium: 8.6 mg/dL — ABNORMAL LOW (ref 8.9–10.3)
Chloride: 108 mmol/L (ref 101–111)
Creatinine, Ser: 1.05 mg/dL (ref 0.61–1.24)
GFR calc Af Amer: >60 mL/min (ref >60)
GFR calc non Af Amer: >60 mL/min (ref >60)
Glucose, Bld: 131 mg/dL — ABNORMAL HIGH (ref 65–99)
Potassium: 3.9 mmol/L (ref 3.5–5.1)
Sodium: 137 mmol/L (ref 135–145)
Total Bilirubin: 0.7 mg/dL (ref 0.3–1.2)
Total Protein: 6 g/dL — ABNORMAL LOW (ref 6.5–8.1)

## 2017-05-02 LAB — MAGNESIUM: Magnesium: 2 mg/dL (ref 1.7–2.4)

## 2017-05-02 LAB — PHOSPHORUS: Phosphorus: 3 mg/dL (ref 2.5–4.6)

## 2017-05-02 MED ORDER — NITROGLYCERIN 0.4 MG SL SUBL: 0.4000 mg | SUBLINGUAL_TABLET | SUBLINGUAL | 0 refills | Status: DC | PRN

## 2017-05-02 MED ORDER — ROSUVASTATIN CALCIUM 20 MG PO TABS: 20.0000 mg | ORAL_TABLET | Freq: Every day | ORAL | 0 refills | Status: DC

## 2017-05-02 MED ORDER — NITROGLYCERIN 0.4 MG/SPRAY TL SOLN: 1.0000 | Status: DC | PRN

## 2017-05-02 MED ORDER — TICAGRELOR 90 MG PO TABS: 90.0000 mg | ORAL_TABLET | Freq: Two times a day (BID) | ORAL | 0 refills | Status: DC

## 2017-05-02 MED ORDER — NITROGLYCERIN 0.4 MG SL SUBL: 0.4000 mg | SUBLINGUAL_TABLET | SUBLINGUAL | Status: DC | PRN

## 2017-05-02 NOTE — Discharge Instructions (Signed)
Call [facility] at [phone] if any bleeding, swelling or drainage at cath site.  May shower, no tub baths for 48 hours for groin sticks. No lifting over 5 pounds for 3 days.  No Driving for 3 days.    Heart Healthy diet   Do Not stop Brilinta and ASA, call if any problems or questions.   Take 1 NTG, under your tongue, while sitting.  If no relief of pain may repeat NTG, one tab every 5 minutes up to 3 tablets total over 15 minutes.  If no relief CALL 911.  If you have dizziness/lightheadness  while taking NTG, stop taking and call 911.

## 2017-05-02 NOTE — Progress Notes (Signed)
CARDIAC REHAB PHASE I   PRE:  Rate/Rhythm: 87 SR BP:  Sitting: 138/68   MODE:  Ambulation: 800 ft   POST:  Rate/Rhythm: 78 SR  BP:  Sitting: 154/71         SaO2: 98 RA  Pt ambulated 800 ft on RA, handheld assist, steady gait, tolerated well with no complaints. Completed MI/stent education with pt and wife at bedside.  Reviewed risk factors, MI/PCI books, anti-platelet therapy, stent card, activity restrictions, ntg, exercise, heart healthy diet and phase 2 cardiac rehab. Pt verbalized understanding, very receptive to education. Pt agrees to phase 2 cardiac rehab referral, will send to [facility] per pt request. Pt up ad lib in room, awaiting discharge.    [place], RN, BSN 05/02/2017 9:27 AM

## 2017-05-02 NOTE — Progress Notes (Signed)
Progress Note  Patient Name: Travis Jenkins Date of Encounter: 05/02/2017  Primary Cardiologist: Dr. Debara Pickett   Subjective   No chest pain since stent  Inpatient Medications    Scheduled Meds: . aspirin EC  81 mg Oral Daily  . enoxaparin (LOVENOX) injection  40 mg Subcutaneous Q24H  . methylPREDNISolone (SOLU-MEDROL) injection  20 mg Intravenous Q12H  . rosuvastatin  20 mg Oral Daily  . sodium chloride flush  3 mL Intravenous Q12H  . ticagrelor  90 mg Oral BID  . traZODone  100 mg Oral QHS   Continuous Infusions: . sodium chloride     PRN Meds: sodium chloride, acetaminophen **OR** acetaminophen, ondansetron **OR** ondansetron (ZOFRAN) IV, polyethylene glycol, sodium chloride flush   Vital Signs    Vitals:   05/02/17 0445 05/02/17 0500 05/02/17 0600 05/02/17 0700  BP: 132/79 140/67 132/81 (!) 143/72  Pulse: (!) 58 67 (!) 50 (!) 47  Resp: 19 17 16 16   Temp: 97.7 F (36.5 C)     TempSrc: Oral     SpO2: 97% 97% 94% 97%  Weight: 223 lb 1.7 oz (101.2 kg)     Height:        Intake/Output Summary (Last 24 hours) at 05/02/17 0804 Last data filed at 05/02/17 0446  Gross per 24 hour  Intake          2331.81 ml  Output             3100 ml  Net          -768.19 ml   Filed Weights   04/28/17 2102 05/02/17 0445  Weight: 225 lb (102.1 kg) 223 lb 1.7 oz (101.2 kg)    Telemetry    SR - Personally Reviewed  ECG    SB at 52  Q wave III, no acute changes from yesterday but improved rate - Personally Reviewed  Physical Exam   Per Dr. Sallyanne Kuster GEN: No acute distress.   Neck: No JVD Cardiac: RRR, no murmurs, rubs, or gallops.  Respiratory: Clear to auscultation bilaterally. GI: Soft, nontender, non-distended  MS: No edema; No deformity. Neuro:  Nonfocal  Psych: Normal affect   Labs    Chemistry Recent Labs Lab 04/30/17 0332 05/01/17 0502 05/02/17 0453  NA 137 137 137  K 4.7 4.1 3.9  CL 108 110 108  CO2 25 22 24   GLUCOSE 162* 156* 131*  BUN 18 15 15     CREATININE 1.21 1.06 1.05  CALCIUM 8.6* 8.4* 8.6*  PROT 6.1* 5.8* 6.0*  ALBUMIN 3.7 3.6 3.6  AST 35 27 26  ALT 15* 15* 17  ALKPHOS 41 36* 37*  BILITOT 0.5 0.4 0.7  GFRNONAA >60 >60 >60  GFRAA >60 >60 >60  ANIONGAP 4* 5 5     Hematology Recent Labs Lab 04/30/17 0332 05/01/17 0502 05/02/17 0453  WBC 14.6* 11.0* 10.9*  RBC 4.13* 4.00* 4.25  HGB 12.6* 12.4* 13.3  HCT 38.3* 37.5* 38.8*  MCV 92.7 93.8 91.3  MCH 30.5 31.0 31.3  MCHC 32.9 33.1 34.3  RDW 12.9 12.9 12.8  PLT 166 180 195    Cardiac Enzymes Recent Labs Lab 04/29/17 0845 04/29/17 1437 04/29/17 2012  TROPONINI 0.17* 0.08* 0.06*    Recent Labs Lab 04/28/17 2322  TROPIPOC 0.34*     BNPNo results for input(s): BNP, PROBNP in the last 168 hours.   DDimer No results for input(s): DDIMER in the last 168 hours.   Radiology    Ct Coronary Morph  W/cta Cor W/score W/ca W/cm &/or Wo/cm  Addendum Date: 04/30/2017   ADDENDUM REPORT: 04/30/2017 18:27 CLINICAL DATA:  2 -year-old male with troponin elevation in the settings of anaphylactic shock. EXAM: Cardiac/Coronary  CT TECHNIQUE: The patient was scanned on a [employer]. FINDINGS: A 120 kV prospective scan was triggered in the descending thoracic aorta at 111 HU's. Axial non-contrast 3 mm slices were carried out through the heart. The data set was analyzed on a dedicated work station and scored using the [place]. Gantry rotation speed was 250 msecs and collimation was .6 mm. No beta blockade and 0.8 mg of sl NTG was given. The 3D data set was reconstructed in 5% intervals of the 67-82 % of the R-R cycle. Diastolic phases were analyzed on a dedicated work station using MPR, MIP and VRT modes. The patient received 80 cc of contrast. Aorta:  Normal size.  No calcifications.  No dissection. Aortic Valve:  Trileaflet.  No calcifications. Coronary Arteries:  Normal coronary origin.  Right dominance. RCA is a large dominant artery that gives rise to PDA and  PLVB. There is no plaque. Left main is a very large artery that gives rise to LAD, ramus intermedius and LCX arteries. LAD is a large vessel that gives rise to one large diagonal branch. There is minimal eccentric calcified plaque in the proximal LAD with associated stenosis 0-25%. Proximal to mid LAD has moderate noncalcified plaque just prior to the D1 takeoff with associated stenosis 50-69%. RI is a small artery that has no plaque. LCX is a non-dominant artery that gives rise to one OM1 branch. There is no plaque. Other findings: Normal pulmonary vein drainage into the left atrium. Normal let atrial appendage without a thrombus. Normal size of the pulmonary artery. IMPRESSION: 1. Normal coronary origin with right dominance. 2. There is a moderate noncalcified plaque in the proximal to mid LAD, just prior to the D1 takeoff with associated stenosis 50-69%. Additional analysis with CT FFR will be submitted. Ena Dawley Electronically Signed   By: Ena Dawley   On: 04/30/2017 18:27   Result Date: 04/30/2017 EXAM: OVER-READ INTERPRETATION  CT CHEST The following report is an over-read performed by radiologist Dr. Rebekah Chesterfield [facility] on 04/30/2017. This over-read does not include interpretation of cardiac or coronary anatomy or pathology. The coronary calcium score/coronary CTA interpretation by the cardiologist is attached. COMPARISON:  None. FINDINGS: Mild multifocal linear scarring in the lower lobes of the lungs bilaterally. Within the visualized portions of the thorax there are no suspicious appearing pulmonary nodules or masses, there is no acute consolidative airspace disease, no pleural effusions, no pneumothorax and no lymphadenopathy. Visualized portions of the upper abdomen are unremarkable. There are no aggressive appearing lytic or blastic lesions noted in the visualized portions of the skeleton. IMPRESSION: 1. No significant incidental noncardiac findings are noted.  Electronically Signed: By: Vinnie Langton M.D. On: 04/30/2017 15:40   Ct Coronary Fractional Flow Reserve Data Prep  Addendum Date: 04/30/2017   ADDENDUM REPORT: 04/30/2017 18:27 CLINICAL DATA:  19 -year-old male with troponin elevation in the settings of anaphylactic shock. EXAM: Cardiac/Coronary  CT TECHNIQUE: The patient was scanned on a [employer]. FINDINGS: A 120 kV prospective scan was triggered in the descending thoracic aorta at 111 HU's. Axial non-contrast 3 mm slices were carried out through the heart. The data set was analyzed on a dedicated work station and scored using the [place]. Gantry rotation speed was 250 msecs and  collimation was .6 mm. No beta blockade and 0.8 mg of sl NTG was given. The 3D data set was reconstructed in 5% intervals of the 67-82 % of the R-R cycle. Diastolic phases were analyzed on a dedicated work station using MPR, MIP and VRT modes. The patient received 80 cc of contrast. Aorta:  Normal size.  No calcifications.  No dissection. Aortic Valve:  Trileaflet.  No calcifications. Coronary Arteries:  Normal coronary origin.  Right dominance. RCA is a large dominant artery that gives rise to PDA and PLVB. There is no plaque. Left main is a very large artery that gives rise to LAD, ramus intermedius and LCX arteries. LAD is a large vessel that gives rise to one large diagonal branch. There is minimal eccentric calcified plaque in the proximal LAD with associated stenosis 0-25%. Proximal to mid LAD has moderate noncalcified plaque just prior to the D1 takeoff with associated stenosis 50-69%. RI is a small artery that has no plaque. LCX is a non-dominant artery that gives rise to one OM1 branch. There is no plaque. Other findings: Normal pulmonary vein drainage into the left atrium. Normal let atrial appendage without a thrombus. Normal size of the pulmonary artery. IMPRESSION: 1. Normal coronary origin with right dominance. 2. There is a moderate noncalcified  plaque in the proximal to mid LAD, just prior to the D1 takeoff with associated stenosis 50-69%. Additional analysis with CT FFR will be submitted. Ena Dawley Electronically Signed   By: Ena Dawley   On: 04/30/2017 18:27   Result Date: 04/30/2017 EXAM: OVER-READ INTERPRETATION  CT CHEST The following report is an over-read performed by radiologist Dr. Rebekah Chesterfield [facility] on 04/30/2017. This over-read does not include interpretation of cardiac or coronary anatomy or pathology. The coronary calcium score/coronary CTA interpretation by the cardiologist is attached. COMPARISON:  None. FINDINGS: Mild multifocal linear scarring in the lower lobes of the lungs bilaterally. Within the visualized portions of the thorax there are no suspicious appearing pulmonary nodules or masses, there is no acute consolidative airspace disease, no pleural effusions, no pneumothorax and no lymphadenopathy. Visualized portions of the upper abdomen are unremarkable. There are no aggressive appearing lytic or blastic lesions noted in the visualized portions of the skeleton. IMPRESSION: 1. No significant incidental noncardiac findings are noted. Electronically Signed: By: Vinnie Langton M.D. On: 04/30/2017 15:40   Ct Coronary Fractional Flow Reserve Fluid Analysis  Addendum Date: 04/30/2017   ADDENDUM REPORT: 04/30/2017 18:27 CLINICAL DATA:  5 -year-old male with troponin elevation in the settings of anaphylactic shock. EXAM: Cardiac/Coronary  CT TECHNIQUE: The patient was scanned on a [employer]. FINDINGS: A 120 kV prospective scan was triggered in the descending thoracic aorta at 111 HU's. Axial non-contrast 3 mm slices were carried out through the heart. The data set was analyzed on a dedicated work station and scored using the [place]. Gantry rotation speed was 250 msecs and collimation was .6 mm. No beta blockade and 0.8 mg of sl NTG was given. The 3D data set was reconstructed  in 5% intervals of the 67-82 % of the R-R cycle. Diastolic phases were analyzed on a dedicated work station using MPR, MIP and VRT modes. The patient received 80 cc of contrast. Aorta:  Normal size.  No calcifications.  No dissection. Aortic Valve:  Trileaflet.  No calcifications. Coronary Arteries:  Normal coronary origin.  Right dominance. RCA is a large dominant artery that gives rise to PDA and PLVB. There is no  plaque. Left main is a very large artery that gives rise to LAD, ramus intermedius and LCX arteries. LAD is a large vessel that gives rise to one large diagonal branch. There is minimal eccentric calcified plaque in the proximal LAD with associated stenosis 0-25%. Proximal to mid LAD has moderate noncalcified plaque just prior to the D1 takeoff with associated stenosis 50-69%. RI is a small artery that has no plaque. LCX is a non-dominant artery that gives rise to one OM1 branch. There is no plaque. Other findings: Normal pulmonary vein drainage into the left atrium. Normal let atrial appendage without a thrombus. Normal size of the pulmonary artery. IMPRESSION: 1. Normal coronary origin with right dominance. 2. There is a moderate noncalcified plaque in the proximal to mid LAD, just prior to the D1 takeoff with associated stenosis 50-69%. Additional analysis with CT FFR will be submitted. Ena Dawley Electronically Signed   By: Ena Dawley   On: 04/30/2017 18:27   Result Date: 04/30/2017 EXAM: OVER-READ INTERPRETATION  CT CHEST The following report is an over-read performed by radiologist Dr. Rebekah Chesterfield [facility] on 04/30/2017. This over-read does not include interpretation of cardiac or coronary anatomy or pathology. The coronary calcium score/coronary CTA interpretation by the cardiologist is attached. COMPARISON:  None. FINDINGS: Mild multifocal linear scarring in the lower lobes of the lungs bilaterally. Within the visualized portions of the thorax there are no  suspicious appearing pulmonary nodules or masses, there is no acute consolidative airspace disease, no pleural effusions, no pneumothorax and no lymphadenopathy. Visualized portions of the upper abdomen are unremarkable. There are no aggressive appearing lytic or blastic lesions noted in the visualized portions of the skeleton. IMPRESSION: 1. No significant incidental noncardiac findings are noted. Electronically Signed: By: Vinnie Langton M.D. On: 04/30/2017 15:40    Cardiac Studies   04/30/17 cardiac ST 1. Normal coronary origin with right dominance.  2. There is a moderate noncalcified plaque in the proximal to mid LAD, just prior to the D1 takeoff with associated stenosis 50-69%. Additional analysis with CT FFR will be submitted.  Cardiac  Cath  05/01/17 Procedures   CORONARY STENT INTERVENTION  INTRAVASCULAR PRESSURE WIRE/FFR STUDY  LEFT HEART CATH AND CORONARY ANGIOGRAPHY  Conclusion   Conclusions: 1. Significant single-vessel coronary artery disease, with sequential 70% and 40% mid and distal LAD stenoses. FFR was hemodynamically significant at 0.76. 2. Normal left ventricular contraction. 3. Mildly elevated left ventricular filling pressure. 4. Successful PCI to 70% mid LAD with placement of a Resolute Onyx 3.5 x 12 mm drug-eluting stent with 0% residual stenosis and TIMI-3 flow. Moderate-caliber D2 branch was jailed by the stent with resultant plaque shift and ostial stenosis. Following balloon angioplasty of D2, there is 70% residual stenosis at the ostium with TIMI-3 flow.  Recommendations: 1. Dual antiplatelet therapy with aspirin and ticagrelor for at least 12 months. 2. Medical management of residual ostial D2 disease; further intervention not attempted due to lack of chest pain and TIMI-3 flow. 3. Aggressive secondary prevention, including escalation of rosuvastatin to 20 mg daily. Fasting lipid panel ordered for tomorrow morning.     Patient Profile     66 y.o.  male with atypical chest pain and recent syncope following insect stings, with mild cardiac troponin elevation and intermediate severity stenosis in the proximal LAD on CT coronary angiography  Assessment & Plan    1.  Elevated Troponin likely represents type II MI 2/2 fixed coronary disease with chest pain   pk  troponin 0.17.  --cardiac rehab has seen and ambulated OK for discharge once MD has seen. Will follow up in 2 weeks with Dr. Debara Pickett or APP   2.   CAD with PCI to mLAD with Resolute Onyx DES --ASA and Brilinta for at least 12 months. And medical management of residual ostial D2 disease due to 'Jailing"  3.    HLD  LDL at 64 on current therapy, managed by PCP continue statin.  4.    Anaphylaxis with syncope, from multiple yellow jacket stings.  --syncope most likely vasovagal.   For questions or updates, please contact [place] Please consult [email or web address] for contact info under Cardiology/STEMI.      Signed, Cecilie Kicks, NP  05/02/2017, 8:04 AM    I have seen and examined the patient along with Cecilie Kicks, NP.  I have reviewed the chart, notes and new data.  I agree with PA/NP's note.  Key new complaints: no chest discomfort Key examination changes: healthy radial access site Key new findings / data: cath results reviewed with patient.  PLAN: Discussed mandatory dual antiplatelet therapy for 12 months. Intensify statin therapy. No beta blocker due to bradycardia. Cardiac rehab.  Sanda Klein, MD, [place] [phone] 05/02/2017, 9:02 AM

## 2017-05-04 ENCOUNTER — Telehealth: Payer: Self-pay | Admitting: *Deleted

## 2017-05-04 ENCOUNTER — Encounter (HOSPITAL_COMMUNITY): Payer: Self-pay | Admitting: Internal Medicine

## 2017-05-04 NOTE — Telephone Encounter (Signed)
PATIENT NEEDED AN NOTE TO RETURN TO WORK AND LEFT FMLA FORM TO BE FILLED OUT WITH MEDICAL RECORDS.  RETURN TO WORK GIVEN TO PATIENT.

## 2017-05-06 ENCOUNTER — Telehealth (HOSPITAL_COMMUNITY): Payer: Self-pay

## 2017-05-06 NOTE — Telephone Encounter (Signed)
Patient insurance is active and benefits verified. Patient insurance is BCBS - no co-payment, deductible $1500/$1500 has been met, out of pocket $4500/$2333.96 has been met, 30% co-insurance and no pre-authorization. Passport/reference [id number].   Patient will be contacted and scheduled after their follow up appointment with the cardiologist on 05/15/17, upon review by [facility] RN navigator.

## 2017-05-12 ENCOUNTER — Telehealth: Payer: Self-pay | Admitting: Cardiovascular Disease

## 2017-05-12 NOTE — Telephone Encounter (Signed)
Received signed FMLA Forms back from Dr Sallyanne Kuster.  Sent signed Forms and package back to [employer] @ [place] for distribution of forms and records.  Sent tot [employer] on 05/12/17 via courier. lp

## 2017-05-12 NOTE — Telephone Encounter (Signed)
Received FMLA Forms for spouse Sherrian Divers) from [facility] for Dr Crotioru to review, complete and sign.  Forms given to [facility] for Dr Sallyanne Kuster. lp

## 2017-05-15 ENCOUNTER — Ambulatory Visit (INDEPENDENT_AMBULATORY_CARE_PROVIDER_SITE_OTHER): Payer: BLUE CROSS/BLUE SHIELD | Admitting: Physician Assistant

## 2017-05-15 ENCOUNTER — Encounter: Payer: Self-pay | Admitting: Physician Assistant

## 2017-05-15 ENCOUNTER — Telehealth: Payer: Self-pay | Admitting: Cardiovascular Disease

## 2017-05-15 VITALS — BP 120/78 | HR 65 | Ht 73.0 in | Wt 214.8 lb

## 2017-05-15 DIAGNOSIS — R001 Bradycardia, unspecified: Secondary | ICD-10-CM

## 2017-05-15 DIAGNOSIS — Z955 Presence of coronary angioplasty implant and graft: Secondary | ICD-10-CM

## 2017-05-15 DIAGNOSIS — I214 Non-ST elevation (NSTEMI) myocardial infarction: Secondary | ICD-10-CM

## 2017-05-15 MED ORDER — TICAGRELOR 90 MG PO TABS: 90.0000 mg | ORAL_TABLET | Freq: Two times a day (BID) | ORAL | 3 refills | Status: DC

## 2017-05-15 MED ORDER — ROSUVASTATIN CALCIUM 20 MG PO TABS: 20.0000 mg | ORAL_TABLET | Freq: Every day | ORAL | 3 refills | Status: DC

## 2017-05-15 NOTE — Telephone Encounter (Signed)
Received Signed FMLA Forms ([employer]) back from Dr Sallyanne Kuster.  Sent to [employer] @ [place] for final processing --notify patient, fax and distribute. Sent by [employer]. lp

## 2017-05-15 NOTE — Patient Instructions (Addendum)
Medication Instructions:  Your physician recommends that you continue on your current medications as directed. Please refer to the Current Medication list given to you today.   Labwork: None ordered  Testing/Procedures: None ordered  Follow-Up: Your physician recommends that you schedule a follow-up appointment in: 2-3 [place]   Any Other Special Instructions Will Be Listed Below (If Applicable). You have been cleared to start Cardiac Rehab.  A referral has been placed for you.  You may contact them for an appt at [phone] and ask for Cardiac Rehab  If you need a refill on your cardiac medications before your next appointment, please call your pharmacy.

## 2017-05-15 NOTE — Progress Notes (Signed)
Cardiology Office Note    Date:  05/16/2017   ID:  Travis Jenkins, DOB [birth year], MRN [id number]  PCP:  Lujean Amel, MD  Cardiologist:  Dr. Sallyanne Kuster (Previously Dr. Bronson Ing in 2015, but patient currently lives in [place])  Chief Complaint  Patient presents with  . Follow-up    seen for Dr. Sallyanne Kuster (although recent dc note suggest Dr. Debara Pickett, pt prefers to fu with Dr  Sallyanne Kuster    History of Present Illness:  Travis Jenkins is a 66 y.o. male with PMH of syncope and renal disease who recently presented to the hospital on 04/28/2017 following an anaphylactic reaction after he was stung by multiple yellow jackets while clearing out a high. He initially had a sense of abdominal discomfort, then had frank syncope thereafter. This syncope was felt to be vasovagal in nature. Upon EMS arrival, he was given Solu-Medrol and IV Benadryl with improvement. Initial labs in the hospital was positive with troponin of 0.34. Prior to this admission he had a normal stress test and echocardiogram in 2015. Coronary CTA obtained on 04/30/2017 showed possible mid LAD stenosis. CT FFR analysis showed hemodynamically significant stenosis in proximal to mid LAD. Cardiac catheterization was performed on 05/01/2017 which showed severe single-vessel disease was 70 and 40% mid to distal LAD stenosis, this was significant by FFR at 0.76. The 70% mid LAD lesion was subsequently treated with Resolute Onyx 3.5 x 12 mm DES, moderate-caliber D2 branch was jailed by the stent with resultant plaque shift and ostial stenosis, even after balloon angioplasty of D2, there was 70% residual stenosis at the ostium.  Patient presents today for cardiology office visit. He did initially had some mild chest discomfort last week, however he has not had any further chest discomfort this week. He was not placed on beta blocker due to baseline bradycardia. He has been compliant with aspirin and Brilinta. We discussed the need to be compliant with  dual antiplatelet therapy. Recent lipid panel showed a well-controlled cholesterol. He does have family history of early CAD with his father having first heart issue in the 60s. He will return for follow-up in 2-3 month. Although discharge paper indicated follow-up with Dr. Debara Pickett, however he preferred to follow-up with Dr. Sallyanne Kuster who saw him in the hospital. I did not see any note by Dr. Debara Pickett.   Past Medical History:  Diagnosis Date  . IBS (irritable bowel syndrome)   . Raynaud's disease   . Syncope     Past Surgical History:  Procedure Laterality Date  . ANKLE FRACTURE SURGERY    . ANTERIOR CRUCIATE LIGAMENT REPAIR  1990  . CORONARY STENT INTERVENTION N/A 05/01/2017   Procedure: CORONARY STENT INTERVENTION;  Surgeon: Nelva Bush, MD;  Location: [facility];  Service: Cardiovascular;  Laterality: N/A;  . INTRAVASCULAR PRESSURE WIRE/FFR STUDY N/A 05/01/2017   Procedure: INTRAVASCULAR PRESSURE WIRE/FFR STUDY;  Surgeon: Nelva Bush, MD;  Location: [facility];  Service: Cardiovascular;  Laterality: N/A;  . LEFT HEART CATH AND CORONARY ANGIOGRAPHY N/A 05/01/2017   Procedure: LEFT HEART CATH AND CORONARY ANGIOGRAPHY;  Surgeon: Nelva Bush, MD;  Location: [facility];  Service: Cardiovascular;  Laterality: N/A;    Current Medications: Outpatient Medications Prior to Visit  Medication Sig Dispense Refill  . aspirin EC 81 MG tablet Take 81 mg by mouth daily.    . diphenhydrAMINE (BENADRYL) 25 mg capsule Take 1 capsule (25 mg total) by mouth every 6 (six) hours as needed for allergies.  30 capsule 2  . nitroGLYCERIN (NITROSTAT) 0.4 MG SL tablet Place 1 tablet (0.4 mg total) under the tongue every 5 (five) minutes as needed for chest pain. 30 tablet 0  . traZODone (DESYREL) 100 MG tablet Take 50-100 mg by mouth at bedtime.  5  . predniSONE (STERAPRED UNI-PAK 21 TAB) 10 MG (21) TBPK tablet Take 6 tablets Day 1, 5 tablets Day 2, 4 Tablets Day 3, 3 Tablets Day 4, 2  Tablets Day 5, and 1 Tablet Day 6 and then Stop Day 7 21 tablet 0  . rosuvastatin (CRESTOR) 20 MG tablet Take 1 tablet (20 mg total) by mouth daily. 30 tablet 0  . ticagrelor (BRILINTA) 90 MG TABS tablet Take 1 tablet (90 mg total) by mouth 2 (two) times daily. 60 tablet 0  . famotidine (PEPCID) 20 MG tablet Take 1 tablet (20 mg total) by mouth 2 (two) times daily. 10 tablet 0   No facility-administered medications prior to visit.      Allergies:   Wasp venom; Erythromycin; and Tetracyclines & related   Social History   Social History  . Marital status: Married    Spouse name: N/A  . Number of children: N/A  . Years of education: N/A   Social History Main Topics  . Smoking status: Former Smoker    Packs/day: 0.50    Years: 12.00    Types: Cigarettes    Start date: 06/20/1966    Quit date: 08/19/1971  . Smokeless tobacco: Never Used  . Alcohol use 2.4 oz/week    4 Cans of beer per week  . Drug use: No  . Sexual activity: Not Asked   Other Topics Concern  . None   Social History Narrative  . None     Family History:  The patient's family history includes Diabetes in his father and mother; Heart disease in his father and mother; Hyperlipidemia in his father; Parkinson's disease in his mother.   ROS:   Please see the history of present illness.    ROS All other systems reviewed and are negative.   PHYSICAL EXAM:   VS:  BP 120/78   Pulse 65   Ht 6\' 1"  (1.854 m)   Wt 214 lb 12.8 oz (97.4 kg)   BMI 28.34 kg/m    GEN: Well nourished, well developed, in no acute distress  HEENT: normal  Neck: no JVD, carotid bruits, or masses Cardiac: RRR; no murmurs, rubs, or gallops,no edema  Respiratory:  clear to auscultation bilaterally, normal work of breathing GI: soft, nontender, nondistended, + BS MS: no deformity or atrophy  Skin: warm and dry, no rash Neuro:  Alert and Oriented x 3, Strength and sensation are intact Psych: euthymic mood, full affect  Wt Readings from Last  3 Encounters:  05/15/17 214 lb 12.8 oz (97.4 kg)  05/02/17 223 lb 1.7 oz (101.2 kg)  03/30/14 210 lb (95.3 kg)      Studies/Labs Reviewed:   EKG:  EKG is ordered today.  The ekg ordered today demonstrates Normal sinus rhythm, minimal ST downsloping in 2, 3, aVF, less than 0.5 mm, not significant.  Recent Labs: 05/02/2017: ALT 17; BUN 15; Creatinine, Ser 1.05; Hemoglobin 13.3; Magnesium 2.0; Platelets 195; Potassium 3.9; Sodium 137   Lipid Panel    Component Value Date/Time   CHOL 132 05/02/2017 0453   TRIG 108 05/02/2017 0453   HDL 46 05/02/2017 0453   CHOLHDL 2.9 05/02/2017 0453   VLDL 22 05/02/2017 0453   LDLCALC  64 05/02/2017 0453    Additional studies/ records that were reviewed today include:   Coronary CT 04/30/2017 FINDINGS: FFRct analysis was performed on the original cardiac CT angiogram dataset. Diagrammatic representation of the FFRct analysis is provided in a separate PDF document in PACS. This dictation was created using the PDF document and an interactive 3D model of the results. 3D model is not available in the EMR/PACS. Normal FFR range is >0.80.  1. Left Main:  No significant stenosis.  2. LAD: No significant stenosis in the proximal LAD. Mid LAD after the takeoff of the first diagonal branch 0.78. 3. LCX: No significant stenosis. 4. RCA: No significant stenosis.  IMPRESSION: 1. CT FFR analysis showed hemodynamically significant stenosis in the proximal to mid LAD.     Cath 05/01/2017 Conclusion   Conclusions: 1. Significant single-vessel coronary artery disease, with sequential 70% and 40% mid and distal LAD stenoses. FFR was hemodynamically significant at 0.76. 2. Normal left ventricular contraction. 3. Mildly elevated left ventricular filling pressure. 4. Successful PCI to 70% mid LAD with placement of a Resolute Onyx 3.5 x 12 mm drug-eluting stent with 0% residual stenosis and TIMI-3 flow. Moderate-caliber D2 branch was jailed by the stent  with resultant plaque shift and ostial stenosis. Following balloon angioplasty of D2, there is 70% residual stenosis at the ostium with TIMI-3 flow.  Recommendations: 1. Dual antiplatelet therapy with aspirin and ticagrelor for at least 12 months. 2. Medical management of residual ostial D2 disease; further intervention not attempted due to lack of chest pain and TIMI-3 flow. 3. Aggressive secondary prevention, including escalation of rosuvastatin to 20 mg daily. Fasting lipid panel ordered for tomorrow morning.     ASSESSMENT:    1. NSTEMI (non-ST elevated myocardial infarction) ([place])   2. Bradycardia   3. S/P coronary artery stent placement      PLAN:  In order of problems listed above:  5. CAD s/p PCI to LAD: Compliant with DAPT, continue aspirin and Brilinta. He did have some mild chest discomfort after released from the hospital last week, however has not had any significant chest discomfort. He denies any chest discomfort for the past 5 days. Continue Crestor 20 mg daily, lipid panel appears to be very well controlled with LDL less than 70. He was not placed on beta blocker due to baseline bradycardia.    Medication Adjustments/Labs and Tests Ordered: Current medicines are reviewed at length with the patient today.  Concerns regarding medicines are outlined above.  Medication changes, Labs and Tests ordered today are listed in the Patient Instructions below. Patient Instructions  Medication Instructions:  Your physician recommends that you continue on your current medications as directed. Please refer to the Current Medication list given to you today.   Labwork: None ordered  Testing/Procedures: None ordered  Follow-Up: Your physician recommends that you schedule a follow-up appointment in: 2-3 [place]   Any Other Special Instructions Will Be Listed Below (If Applicable). You have been cleared to start Cardiac Rehab.  A referral has been placed for  you.  You may contact them for an appt at [phone] and ask for Cardiac Rehab  If you need a refill on your cardiac medications before your next appointment, please call your pharmacy.      Hilbert Corrigan, [place]  05/16/2017 9:56 PM    [facility] [place], [place], [place]  [address] Phone: [phone]; Fax: [phone]

## 2017-05-16 ENCOUNTER — Encounter: Payer: Self-pay | Admitting: Physician Assistant

## 2017-05-18 ENCOUNTER — Telehealth (HOSPITAL_COMMUNITY): Payer: Self-pay

## 2017-05-18 NOTE — Telephone Encounter (Signed)
I called patient to discuss scheduling for cardiac rehab. Patient declined at this time. Patient wants to wait to see if his out of pocket gets closer to being met before scheduling.

## 2017-05-20 ENCOUNTER — Telehealth (HOSPITAL_COMMUNITY): Payer: Self-pay | Admitting: Pharmacist

## 2017-05-20 NOTE — Telephone Encounter (Signed)
Cardiac Rehab Medication Review by a Pharmacist  Does the patient  feel that his/her medications are working for him/her?  yes  Has the patient been experiencing any side effects to the medications prescribed?  no  Does the patient measure his/her own blood pressure or blood glucose at home?  yes   Does the patient have any problems obtaining medications due to transportation or finances?   no  Understanding of regimen: excellent Understanding of indications: excellent Potential of compliance: excellent  Pharmacist comments: No questions; however, patient said that he has noticed that he bleeds easier when he gets scratched due to the thinning of his blood, but that it is nothing serious and that he will let his doctor know if it gets worse. Also, blood pressure in the office was 140s, but patient has been stable (120s) at home, so he does not tend to check blood pressure regularly.  Nida Boatman, PharmD PGY1 Acute Care Pharmacy Resident Pager: [phone]  05/20/2017 5:11 PM

## 2017-05-25 ENCOUNTER — Telehealth (HOSPITAL_COMMUNITY): Payer: Self-pay

## 2017-05-25 NOTE — Telephone Encounter (Signed)
*  Updated insurance benefits* [employer] - no co-payment, deductible $1500/$1500 has been met, out of pocket $4500/$4500 has been met, 30% co-insurance and no pre-authorization. Passport/reference [phone].

## 2017-05-26 ENCOUNTER — Encounter (HOSPITAL_COMMUNITY): Payer: Self-pay

## 2017-05-26 ENCOUNTER — Encounter (HOSPITAL_COMMUNITY)
Admission: RE | Admit: 2017-05-26 | Discharge: 2017-05-26 | Disposition: A | Discharge status: Home / Self Care | Payer: BLUE CROSS/BLUE SHIELD | Source: Ambulatory Visit | Attending: Cardiovascular Disease | Admitting: Cardiovascular Disease

## 2017-05-26 VITALS — BP 130/78 | HR 75 | Ht 73.0 in | Wt 213.2 lb

## 2017-05-26 DIAGNOSIS — Z48812 Encounter for surgical aftercare following surgery on the circulatory system: Secondary | ICD-10-CM | POA: Diagnosis not present

## 2017-05-26 DIAGNOSIS — I214 Non-ST elevation (NSTEMI) myocardial infarction: Secondary | ICD-10-CM | POA: Insufficient documentation

## 2017-05-26 DIAGNOSIS — Z955 Presence of coronary angioplasty implant and graft: Secondary | ICD-10-CM | POA: Insufficient documentation

## 2017-05-26 HISTORY — DX: Bradycardia, unspecified: R00.1

## 2017-05-26 HISTORY — DX: Atherosclerotic heart disease of native coronary artery without angina pectoris: I25.10

## 2017-05-26 NOTE — Progress Notes (Signed)
Cardiac Individual Treatment Plan  Patient Details  Name: Travis Jenkins MRN: [id number] Date of Birth: 1950/10/26 Referring Provider:     [place] from 05/26/2017 in [place]  Referring Provider  Braulio Bosch MD      Initial Encounter Date:    CARDIAC REHAB PHASE II ORIENTATION from 05/26/2017 in [place]  Date  05/26/17  Referring Provider  Braulio Bosch MD      Visit Diagnosis: NSTEMI (non-ST elevated myocardial infarction) [facility])  Status post coronary artery stent placement  Patient's Home Medications on Admission:  Current Outpatient Prescriptions:  .  aspirin EC 81 MG tablet, Take 81 mg by mouth daily., Disp: , Rfl:  .  diphenhydrAMINE (BENADRYL) 25 mg capsule, Take 1 capsule (25 mg total) by mouth every 6 (six) hours as needed for allergies. (Patient not taking: Reported on 05/20/2017), Disp: 30 capsule, Rfl: 2 .  nitroGLYCERIN (NITROSTAT) 0.4 MG SL tablet, Place 1 tablet (0.4 mg total) under the tongue every 5 (five) minutes as needed for chest pain., Disp: 30 tablet, Rfl: 0 .  rosuvastatin (CRESTOR) 20 MG tablet, Take 1 tablet (20 mg total) by mouth daily., Disp: 90 tablet, Rfl: 3 .  ticagrelor (BRILINTA) 90 MG TABS tablet, Take 1 tablet (90 mg total) by mouth 2 (two) times daily., Disp: 180 tablet, Rfl: 3 .  traZODone (DESYREL) 100 MG tablet, Take 50-100 mg by mouth at bedtime., Disp: , Rfl: 5  Past Medical History: Past Medical History:  Diagnosis Date  . Bradycardia 05/01/2017  . Coronary artery disease   . IBS (irritable bowel syndrome)   . Raynaud's disease   . Syncope     Tobacco Use: History  Smoking Status  . Former Smoker  . Packs/day: 0.50  . Years: 12.00  . Types: Cigarettes  . Start date: 06/20/1966  . Quit date: 08/19/1971  Smokeless Tobacco  . Never Used    Labs: Recent Review Flowsheet Data    Labs for ITP Cardiac and Pulmonary Rehab Latest Ref  Rng & Units 04/28/2017 05/02/2017   Cholestrol 0 - 200 mg/dL - 132   LDLCALC 0 - 99 mg/dL - 64   HDL >40 mg/dL - 46   Trlycerides <150 mg/dL - 108   TCO2 22 - 32 mmol/L 26 -      Capillary Blood Glucose: Lab Results  Component Value Date   GLUCAP 139 (H) 05/01/2017     Exercise Target Goals: Date: 05/26/17  Exercise Program Goal: Individual exercise prescription set with THRR, safety & activity barriers. Participant demonstrates ability to understand and report RPE using BORG scale, to self-measure pulse accurately, and to acknowledge the importance of the exercise prescription.  Exercise Prescription Goal: Starting with aerobic activity 30 plus minutes a day, 3 days per week for initial exercise prescription. Provide home exercise prescription and guidelines that participant acknowledges understanding prior to discharge.  Activity Barriers & Risk Stratification:     Activity Barriers & Cardiac Risk Stratification - 05/26/17 1257      Activity Barriers & Cardiac Risk Stratification   Activity Barriers Back Problems;Other (comment)   Comments L knee scope   Cardiac Risk Stratification High      6 Minute Walk:     6 Minute Walk    Row Name 05/26/17 1037 05/26/17 1159       6 Minute Walk   Phase Initial  -    Distance 2036 feet  -  Walk Time 6 minutes  -    # of Rest Breaks 0  -    MPH 3.86  -    METS 4.72 -    RPE 11 -    VO2 Peak 16.5 -    Resting HR 75 bpm  -    Resting BP 130/78  -    Resting Oxygen Saturation  95 %  RA  -    Exercise Oxygen Saturation  during 6 min walk 99 %  RA  -    Max Ex. HR 109 bpm -    Max Ex. BP 140/88 -    2 Minute Post BP 114/78 -       Oxygen Initial Assessment:   Oxygen Re-Evaluation:   Oxygen Discharge (Final Oxygen Re-Evaluation):   Initial Exercise Prescription:     Initial Exercise Prescription - 05/26/17 1000      Date of Initial Exercise RX and Referring Provider   Date 05/26/17   Referring Provider  Braulio Bosch MD     Treadmill   MPH 3.2   Grade 1   Minutes 10   METs 3.89     Bike   Level 1.2   Minutes 10   METs 3.38     NuStep   Level 4   SPM 80   Minutes 10   METs 3     Prescription Details   Frequency (times per week) 3   Duration Progress to 30 minutes of continuous aerobic without signs/symptoms of physical distress     Intensity   THRR 40-80% of Max Heartrate 64-125   Ratings of Perceived Exertion 11-13   Perceived Dyspnea 0-4     Progression   Progression Continue to progress workloads to maintain intensity without signs/symptoms of physical distress.     Resistance Training   Training Prescription Yes   Weight 4lbs   Reps 10-15      Perform Capillary Blood Glucose checks as needed.  Exercise Prescription Changes:   Exercise Comments:   Exercise Goals and Review:      Exercise Goals    Row Name 05/26/17 1043             Exercise Goals   Increase Physical Activity Yes       Intervention Provide advice, education, support and counseling about physical activity/exercise needs.;Develop an individualized exercise prescription for aerobic and resistive training based on initial evaluation findings, risk stratification, comorbidities and participant's personal goals.       Expected Outcomes Achievement of increased cardiorespiratory fitness and enhanced flexibility, muscular endurance and strength shown through measurements of functional capacity and personal statement of participant.       Increase Strength and Stamina Yes  Be consistent with exercise program       Intervention Provide advice, education, support and counseling about physical activity/exercise needs.;Develop an individualized exercise prescription for aerobic and resistive training based on initial evaluation findings, risk stratification, comorbidities and participant's personal goals.       Expected Outcomes Achievement of increased cardiorespiratory fitness and enhanced  flexibility, muscular endurance and strength shown through measurements of functional capacity and personal statement of participant.       Able to understand and use rate of perceived exertion (RPE) scale Yes       Intervention Provide education and explanation on how to use RPE scale       Expected Outcomes Short Term: Able to use RPE daily in rehab to express subjective intensity level;Long Term:  Able to use RPE to guide intensity level when exercising independently       Knowledge and understanding of Target Heart Rate Range (THRR) Yes       Intervention Provide education and explanation of THRR including how the numbers were predicted and where they are located for reference       Expected Outcomes Short Term: Able to state/look up THRR;Long Term: Able to use THRR to govern intensity when exercising independently;Short Term: Able to use daily as guideline for intensity in rehab       Able to check pulse independently Yes       Intervention Provide education and demonstration on how to check pulse in carotid and radial arteries.;Review the importance of being able to check your own pulse for safety during independent exercise       Expected Outcomes Short Term: Able to explain why pulse checking is important during independent exercise;Long Term: Able to check pulse independently and accurately       Understanding of Exercise Prescription Yes       Intervention Provide education, explanation, and written materials on patient's individual exercise prescription       Expected Outcomes Short Term: Able to explain program exercise prescription;Long Term: Able to explain home exercise prescription to exercise independently          Exercise Goals Re-Evaluation :    Discharge Exercise Prescription (Final Exercise Prescription Changes):   Nutrition:  Target Goals: Understanding of nutrition guidelines, daily intake of sodium 1500mg , cholesterol 200mg , calories 30% from fat and 7% or less from  saturated fats, daily to have 5 or more servings of fruits and vegetables.  Biometrics:     Pre Biometrics - 05/26/17 1233      Pre Biometrics   Weight 213 lb 3 oz (96.7 kg)   Waist Circumference 40 inches   Hip Circumference 42.75 inches   Waist to Hip Ratio 0.94 %   BMI (Calculated) 28.13   Triceps Skinfold 28 mm   % Body Fat 29.7 %   Grip Strength 45 kg   Flexibility 9.5 in   Single Leg Stand 30 seconds       Nutrition Therapy Plan and Nutrition Goals:   Nutrition Discharge: Nutrition Scores:   Nutrition Goals Re-Evaluation:   Nutrition Goals Re-Evaluation:   Nutrition Goals Discharge (Final Nutrition Goals Re-Evaluation):   Psychosocial: Target Goals: Acknowledge presence or absence of significant depression and/or stress, maximize coping skills, provide positive support system. Participant is able to verbalize types and ability to use techniques and skills needed for reducing stress and depression.  Initial Review & Psychosocial Screening:     Initial Psych Review & Screening - 05/26/17 1003      Initial Review   Current issues with None Identified     Family Dynamics   Good Support System? Yes  spouse, family, friends    Comments no psychosocial needs identified, no interventions necessary      Barriers   Psychosocial barriers to participate in program There are no identifiable barriers or psychosocial needs.     Screening Interventions   Interventions Encouraged to exercise;To provide support and resources with identified psychosocial needs      Quality of Life Scores:     Quality of Life - 05/26/17 1003      Quality of Life Scores   Health/Function Pre 27.6 %   Socioeconomic Pre 28.5 %   Psych/Spiritual Pre 30 %   Family Pre 27.6 %  GLOBAL Pre 28.29 %      PHQ-9: Recent Review Flowsheet Data    There is no flowsheet data to display.     Interpretation of Total Score  Total Score Depression Severity:  1-4 = Minimal depression,  5-9 = Mild depression, 10-14 = Moderate depression, 15-19 = Moderately severe depression, 20-27 = Severe depression   Psychosocial Evaluation and Intervention:   Psychosocial Re-Evaluation:   Psychosocial Discharge (Final Psychosocial Re-Evaluation):   Vocational Rehabilitation: Provide vocational rehab assistance to qualifying candidates.   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 Participant will state understanding/return demonstration of topics presented.  Learning Barriers/Preferences:     Learning Barriers/Preferences - 05/26/17 1036      Learning Barriers/Preferences   Learning Barriers Sight   Learning Preferences Verbal Instruction;Audio      Education Topics: Count Your Pulse:  -Group instruction provided by verbal instruction, demonstration, patient participation and written materials to support subject.  Instructors address importance of being able to find your pulse and how to count your pulse when at home without a heart monitor.  Patients get hands on experience counting their pulse with staff help and individually.   Heart Attack, Angina, and Risk Factor Modification:  -Group instruction provided by verbal instruction, video, and written materials to support subject.  Instructors address signs and symptoms of angina and heart attacks.    Also discuss risk factors for heart disease and how to make changes to improve heart health risk factors.   Functional Fitness:  -Group instruction provided by verbal instruction, demonstration, patient participation, and written materials to support subject.  Instructors address safety measures for doing things around the house.  Discuss  how to get up and down off the floor, how to pick things up properly, how to safely get out of a chair without assistance, and balance training.   Meditation and Mindfulness:  -Group instruction provided by verbal instruction, patient participation, and written materials to support subject.  Instructor addresses importance of mindfulness and meditation practice to help reduce stress and improve awareness.  Instructor also leads participants through a meditation exercise.    Stretching for Flexibility and Mobility:  -Group instruction provided by verbal instruction, patient participation, and written materials to support subject.  Instructors lead participants through series of stretches that are designed to increase flexibility thus improving mobility.  These stretches are additional exercise for major muscle groups that are typically performed during regular warm up and cool down.   Hands Only CPR:  -Group verbal, video, and participation provides a basic overview of AHA guidelines for community CPR. Role-play of emergencies allow participants the opportunity to practice calling for help and chest compression technique with discussion of AED use.   Hypertension: -Group verbal and written instruction that provides a basic overview of hypertension including the most recent diagnostic guidelines, risk factor reduction with self-care instructions and medication management.    Nutrition I class: Heart Healthy Eating:  -Group instruction provided by PowerPoint slides, verbal discussion, and written materials to support subject matter. The instructor gives an explanation and review of the Therapeutic Lifestyle Changes diet recommendations, which includes a discussion on lipid goals, dietary fat, sodium, fiber, plant stanol/sterol esters, sugar, and  the components of a well-balanced, healthy diet.   Nutrition II class: Lifestyle Skills:  -Group instruction provided by PowerPoint slides, verbal  discussion, and written materials to support subject matter. The instructor gives an explanation and review of label reading, grocery shopping for heart health, heart healthy recipe modifications, and ways to make healthier choices when eating out.   Diabetes Question & Answer:  -Group instruction provided by PowerPoint slides, verbal discussion, and written materials to support subject matter. The instructor gives an explanation and review of diabetes co-morbidities, pre- and post-prandial blood glucose goals, pre-exercise blood glucose goals, signs, symptoms, and treatment of hypoglycemia and hyperglycemia, and foot care basics.   Diabetes Blitz:  -Group instruction provided by PowerPoint slides, verbal discussion, and written materials to support subject matter. The instructor gives an explanation and review of the physiology behind type 1 and type 2 diabetes, diabetes medications and rational behind using different medications, pre- and post-prandial blood glucose recommendations and Hemoglobin A1c goals, diabetes diet, and exercise including blood glucose guidelines for exercising safely.    Portion Distortion:  -Group instruction provided by PowerPoint slides, verbal discussion, written materials, and food models to support subject matter. The instructor gives an explanation of serving size versus portion size, changes in portions sizes over the last 20 years, and what consists of a serving from each food group.   Stress Management:  -Group instruction provided by verbal instruction, video, and written materials to support subject matter.  Instructors review role of stress in heart disease and how to cope with stress positively.     Exercising on Your Own:  -Group instruction provided by verbal instruction, power point, and written materials to support subject.  Instructors discuss benefits of exercise, components of exercise, frequency and intensity of exercise, and end points for  exercise.  Also discuss use of nitroglycerin and activating EMS.  Review options of places to exercise outside of rehab.  Review guidelines for sex with heart disease.   Cardiac Drugs I:  -Group instruction provided by verbal instruction and written materials to support subject.  Instructor reviews cardiac drug classes: antiplatelets, anticoagulants, beta blockers, and statins.  Instructor discusses reasons, side effects, and lifestyle considerations for each drug class.   Cardiac Drugs II:  -Group instruction provided by verbal instruction and written materials to support subject.  Instructor reviews cardiac drug classes: angiotensin converting enzyme inhibitors (ACE-I), angiotensin II receptor blockers (ARBs), nitrates, and calcium channel blockers.  Instructor discusses reasons, side effects, and lifestyle considerations for each drug class.   Anatomy and Physiology of the Circulatory System:  Group verbal and written instruction and models provide basic cardiac anatomy and physiology, with the coronary electrical and arterial systems. Review of: AMI, Angina, Valve disease, Heart Failure, Peripheral Artery Disease, Cardiac Arrhythmia, Pacemakers, and the ICD.   Other Education:  -Group or individual verbal, written, or video instructions that support the educational goals of the cardiac rehab program.   Knowledge Questionnaire Score:     Knowledge Questionnaire Score - 05/26/17 1002      Knowledge Questionnaire Score   Pre Score 22/24      Core Components/Risk Factors/Patient Goals at Admission:     Personal Goals and Risk Factors at Admission - 05/26/17 1018      Core Components/Risk Factors/Patient Goals on Admission    Weight Management Weight Maintenance;Yes;Weight Loss   Intervention Weight Management: Provide education and appropriate resources to help participant work on and attain dietary goals.;Weight Management: Develop a combined nutrition and exercise  program  designed to reach desired caloric intake, while maintaining appropriate intake of nutrient and fiber, sodium and fats, and appropriate energy expenditure required for the weight goal.   Admit Weight 212 lb 8.4 oz (96.4 kg)   Goal Weight: Short Term 190 lb (86.2 kg)   Goal Weight: Long Term 175 lb (79.4 kg)   Expected Outcomes Short Term: Continue to assess and modify interventions until short term weight is achieved;Long Term: Adherence to nutrition and physical activity/exercise program aimed toward attainment of established weight goal;Weight Maintenance: Understanding of the daily nutrition guidelines, which includes 25-35% calories from fat, 7% or less cal from saturated fats, less than 200mg  cholesterol, less than 1.5gm of sodium, & 5 or more servings of fruits and vegetables daily;Weight Loss: Understanding of general recommendations for a balanced deficit meal plan, which promotes 1-2 lb weight loss per week and includes a negative energy balance of [phone] kcal/d;Understanding recommendations for meals to include 15-35% energy as protein, 25-35% energy from fat, 35-60% energy from carbohydrates, less than 200mg  of dietary cholesterol, 20-35 gm of total fiber daily;Understanding of distribution of calorie intake throughout the day with the consumption of 4-5 meals/snacks;Weight Gain: Understanding of general recommendations for a high calorie, high protein meal plan that promotes weight gain by distributing calorie intake throughout the day with the consumption for 4-5 meals, snacks, and/or supplements      Core Components/Risk Factors/Patient Goals Review:    Core Components/Risk Factors/Patient Goals at Discharge (Final Review):    ITP Comments:     ITP Comments    Row Name 05/26/17 0814           ITP Comments Medical Director, Dr. Fransico Him          Comments: Jerrett attended orientation from 0800  to 0930 to review rules and guidelines for program. Completed 6 minute walk  test, Intitial ITP, and exercise prescription.  VSS. Telemetry-Sinus Rhythm.  Asymptomatic.Barnet Pall, RN,BSN 05/26/2017 12:57 PM

## 2017-05-27 NOTE — Progress Notes (Signed)
Travis Jenkins 66 y.o. male DOB [birth year] MRN [id number]       Nutrition: Brief Note  1. NSTEMI (non-ST elevated myocardial infarction) ([facility])   2. Status post coronary artery stent placement    Past Medical History:  Diagnosis Date  . Bradycardia 05/01/2017  . Coronary artery disease   . IBS (irritable bowel syndrome)   . Raynaud's disease   . Syncope    Meds reviewed.   HT: Ht Readings from Last 1 Encounters:  05/26/17 6\' 1"  (1.854 m)    WT: Wt Readings from Last 3 Encounters:  05/26/17 213 lb 3 oz (96.7 kg)  05/15/17 214 lb 12.8 oz (97.4 kg)  05/02/17 223 lb 1.7 oz (101.2 kg)     BMI 28.1   Current tobacco use? No     Labs:  Lipid Panel     Component Value Date/Time   CHOL 132 05/02/2017 0453   TRIG 108 05/02/2017 0453   HDL 46 05/02/2017 0453   CHOLHDL 2.9 05/02/2017 0453   VLDL 22 05/02/2017 0453   LDLCALC 64 05/02/2017 0453    No results found for: HGBA1C CBG (last 3)  No results for input(s): GLUCAP in the last 72 hours.  Nutrition Diagnosis ? Food-and nutrition-related knowledge deficit related to lack of exposure to information as related to diagnosis of: ? CVD  ? Overweight related to excessive energy intake as evidenced by a BMI of 28.1  Nutrition Goal(s):  Pt to identify food quantities necessary to achieve weight loss of 6-24 lb (2.7-10.9 kg) at graduation from cardiac rehab.  Plan:  Pt to attend nutrition classes ? Nutrition I ? Nutrition II ? Portion Distortion  Will provide client-centered nutrition education as part of interdisciplinary care.   Monitor and evaluate progress toward nutrition goal with team.  Derek Mound, M.Ed, RD, LDN, CDE 05/27/2017 11:14 AM

## 2017-06-01 ENCOUNTER — Encounter (HOSPITAL_COMMUNITY)
Admission: RE | Admit: 2017-06-01 | Discharge: 2017-06-01 | Disposition: A | Discharge status: Home / Self Care | Payer: BLUE CROSS/BLUE SHIELD | Source: Ambulatory Visit | Attending: Cardiovascular Disease | Admitting: Cardiovascular Disease

## 2017-06-01 DIAGNOSIS — Z48812 Encounter for surgical aftercare following surgery on the circulatory system: Secondary | ICD-10-CM | POA: Diagnosis not present

## 2017-06-01 DIAGNOSIS — Z955 Presence of coronary angioplasty implant and graft: Secondary | ICD-10-CM

## 2017-06-01 DIAGNOSIS — I214 Non-ST elevation (NSTEMI) myocardial infarction: Secondary | ICD-10-CM

## 2017-06-01 NOTE — Progress Notes (Signed)
Daily Session Note  Patient Details  Name: DESTINE AMBROISE MRN: [id number] Date of Birth: May 25, 1951 Referring Provider:     CARDIAC REHAB PHASE II ORIENTATION from 05/26/2017 in [facility]  Referring Provider  Braulio Bosch MD      Encounter Date: 06/01/2017  Check In:   Capillary Blood Glucose: No results found for this or any previous visit (from the past 24 hour(s)).    History  Smoking Status  . Former Smoker  . Packs/day: 0.50  . Years: 12.00  . Types: Cigarettes  . Start date: 06/20/1966  . Quit date: 08/19/1971  Smokeless Tobacco  . Never Used    Goals Met:  Exercise tolerated well  Goals Unmet:  Not Applicable  Comments: [name] started cardiac rehab today.  Pt tolerated light exercise without difficulty. VSS, telemetry-Sinus Rhythm, asymptomatic.  Medication list reconciled. Pt denies barriers to medicaiton compliance.  PSYCHOSOCIAL ASSESSMENT:  PHQ-0. Pt exhibits positive coping skills, hopeful outlook with supportive family. No psychosocial needs identified at this time, no psychosocial interventions necessary.    Pt enjoys riding motorcycles and playing with his dogs..   Pt oriented to exercise equipment and routine.    Understanding verbalized.Barnet Pall, RN,BSN 06/01/2017 4:18 PM   Dr. Fransico Him is Medical Director for Cardiac Rehab at [facility].

## 2017-06-03 ENCOUNTER — Encounter (HOSPITAL_COMMUNITY)
Admission: RE | Admit: 2017-06-03 | Discharge: 2017-06-03 | Disposition: A | Discharge status: Home / Self Care | Payer: BLUE CROSS/BLUE SHIELD | Source: Ambulatory Visit | Attending: Cardiovascular Disease | Admitting: Cardiovascular Disease

## 2017-06-03 DIAGNOSIS — I214 Non-ST elevation (NSTEMI) myocardial infarction: Secondary | ICD-10-CM

## 2017-06-03 DIAGNOSIS — Z955 Presence of coronary angioplasty implant and graft: Secondary | ICD-10-CM

## 2017-06-03 DIAGNOSIS — Z48812 Encounter for surgical aftercare following surgery on the circulatory system: Secondary | ICD-10-CM | POA: Diagnosis not present

## 2017-06-05 ENCOUNTER — Encounter (HOSPITAL_COMMUNITY)
Admission: RE | Admit: 2017-06-05 | Discharge: 2017-06-05 | Disposition: A | Discharge status: Home / Self Care | Payer: BLUE CROSS/BLUE SHIELD | Source: Ambulatory Visit | Attending: Cardiovascular Disease | Admitting: Cardiovascular Disease

## 2017-06-05 DIAGNOSIS — Z955 Presence of coronary angioplasty implant and graft: Secondary | ICD-10-CM

## 2017-06-05 DIAGNOSIS — Z48812 Encounter for surgical aftercare following surgery on the circulatory system: Secondary | ICD-10-CM | POA: Diagnosis not present

## 2017-06-05 DIAGNOSIS — I214 Non-ST elevation (NSTEMI) myocardial infarction: Secondary | ICD-10-CM

## 2017-06-05 NOTE — Progress Notes (Signed)
Reviewed home exercise guidelines with patient including endpoints, temperature precautions, target heart rate and rate of perceived exertion. Pt is walking 20 minutes 5 days/week as his mode of home exercise. Encouraged pt to increase duration to 30 minutes, and he is agreeable to this. Pt voices understanding of instructions given. Sol Passer, MS, ACSM CEP

## 2017-06-08 ENCOUNTER — Encounter (HOSPITAL_COMMUNITY): Payer: BLUE CROSS/BLUE SHIELD

## 2017-06-10 ENCOUNTER — Encounter (HOSPITAL_COMMUNITY): Payer: BLUE CROSS/BLUE SHIELD

## 2017-06-12 ENCOUNTER — Encounter (HOSPITAL_COMMUNITY): Payer: BLUE CROSS/BLUE SHIELD

## 2017-06-15 ENCOUNTER — Encounter (HOSPITAL_COMMUNITY)
Admission: RE | Admit: 2017-06-15 | Discharge: 2017-06-15 | Disposition: A | Discharge status: Home / Self Care | Payer: BLUE CROSS/BLUE SHIELD | Source: Ambulatory Visit | Attending: Cardiovascular Disease | Admitting: Cardiovascular Disease

## 2017-06-15 DIAGNOSIS — Z955 Presence of coronary angioplasty implant and graft: Secondary | ICD-10-CM

## 2017-06-15 DIAGNOSIS — Z48812 Encounter for surgical aftercare following surgery on the circulatory system: Secondary | ICD-10-CM | POA: Diagnosis not present

## 2017-06-15 DIAGNOSIS — I214 Non-ST elevation (NSTEMI) myocardial infarction: Secondary | ICD-10-CM

## 2017-06-17 ENCOUNTER — Encounter (HOSPITAL_COMMUNITY)
Admission: RE | Admit: 2017-06-17 | Discharge: 2017-06-17 | Disposition: A | Discharge status: Home / Self Care | Payer: BLUE CROSS/BLUE SHIELD | Source: Ambulatory Visit | Attending: Cardiovascular Disease | Admitting: Cardiovascular Disease

## 2017-06-17 DIAGNOSIS — Z955 Presence of coronary angioplasty implant and graft: Secondary | ICD-10-CM

## 2017-06-17 DIAGNOSIS — I214 Non-ST elevation (NSTEMI) myocardial infarction: Secondary | ICD-10-CM

## 2017-06-17 DIAGNOSIS — Z48812 Encounter for surgical aftercare following surgery on the circulatory system: Secondary | ICD-10-CM | POA: Diagnosis not present

## 2017-06-18 NOTE — Progress Notes (Signed)
Cardiac Individual Treatment Plan  Patient Details  Name: Travis Jenkins MRN: [id number] Date of Birth: 05/29/1951 Referring Provider:     [facility] from 05/26/2017 in [place]  Referring Provider  Braulio Bosch MD      Initial Encounter Date:    CARDIAC REHAB PHASE II ORIENTATION from 05/26/2017 in [place]  Date  05/26/17  Referring Provider  Braulio Bosch MD      Visit Diagnosis: NSTEMI (non-ST elevated myocardial infarction) [facility])  Status post coronary artery stent placement  Patient's Home Medications on Admission:  Current Outpatient Prescriptions:  .  aspirin EC 81 MG tablet, Take 81 mg by mouth daily., Disp: , Rfl:  .  diphenhydrAMINE (BENADRYL) 25 mg capsule, Take 1 capsule (25 mg total) by mouth every 6 (six) hours as needed for allergies., Disp: 30 capsule, Rfl: 2 .  nitroGLYCERIN (NITROSTAT) 0.4 MG SL tablet, Place 1 tablet (0.4 mg total) under the tongue every 5 (five) minutes as needed for chest pain., Disp: 30 tablet, Rfl: 0 .  rosuvastatin (CRESTOR) 20 MG tablet, Take 1 tablet (20 mg total) by mouth daily., Disp: 90 tablet, Rfl: 3 .  ticagrelor (BRILINTA) 90 MG TABS tablet, Take 1 tablet (90 mg total) by mouth 2 (two) times daily., Disp: 180 tablet, Rfl: 3 .  traZODone (DESYREL) 100 MG tablet, Take 50-100 mg by mouth at bedtime., Disp: , Rfl: 5  Past Medical History: Past Medical History:  Diagnosis Date  . Bradycardia 05/01/2017  . Coronary artery disease   . IBS (irritable bowel syndrome)   . Raynaud's disease   . Syncope     Tobacco Use: History  Smoking Status  . Former Smoker  . Packs/day: 0.50  . Years: 12.00  . Types: Cigarettes  . Start date: 06/20/1966  . Quit date: 08/19/1971  Smokeless Tobacco  . Never Used    Labs: Recent Review Flowsheet Data    Labs for ITP Cardiac and Pulmonary Rehab Latest Ref Rng & Units 04/28/2017 05/02/2017    Cholestrol 0 - 200 mg/dL - 132   LDLCALC 0 - 99 mg/dL - 64   HDL >40 mg/dL - 46   Trlycerides <150 mg/dL - 108   TCO2 22 - 32 mmol/L 26 -      Capillary Blood Glucose: Lab Results  Component Value Date   GLUCAP 139 (H) 05/01/2017     Exercise Target Goals:    Exercise Program Goal: Individual exercise prescription set with THRR, safety & activity barriers. Participant demonstrates ability to understand and report RPE using BORG scale, to self-measure pulse accurately, and to acknowledge the importance of the exercise prescription.  Exercise Prescription Goal: Starting with aerobic activity 30 plus minutes a day, 3 days per week for initial exercise prescription. Provide home exercise prescription and guidelines that participant acknowledges understanding prior to discharge.  Activity Barriers & Risk Stratification:     Activity Barriers & Cardiac Risk Stratification - 05/26/17 1257      Activity Barriers & Cardiac Risk Stratification   Activity Barriers Back Problems;Other (comment)   Comments L knee scope   Cardiac Risk Stratification High      6 Minute Walk:     6 Minute Walk    Row Name 05/26/17 1037 05/26/17 1159       6 Minute Walk   Phase Initial  -    Distance 2036 feet  -    Walk Time 6  minutes  -    # of Rest Breaks 0  -    MPH 3.86  -    METS 4.72 -    RPE 11 -    VO2 Peak 16.5 -    Resting HR 75 bpm  -    Resting BP 130/78  -    Resting Oxygen Saturation  95 %  RA  -    Exercise Oxygen Saturation  during 6 min walk 99 %  RA  -    Max Ex. HR 109 bpm -    Max Ex. BP 140/88 -    2 Minute Post BP 114/78 -       Oxygen Initial Assessment:   Oxygen Re-Evaluation:   Oxygen Discharge (Final Oxygen Re-Evaluation):   Initial Exercise Prescription:     Initial Exercise Prescription - 05/26/17 1000      Date of Initial Exercise RX and Referring Provider   Date 05/26/17   Referring Provider Braulio Bosch MD     Treadmill   MPH 3.2    Grade 1   Minutes 10   METs 3.89     Bike   Level 1.2   Minutes 10   METs 3.38     NuStep   Level 4   SPM 80   Minutes 10   METs 3     Prescription Details   Frequency (times per week) 3   Duration Progress to 30 minutes of continuous aerobic without signs/symptoms of physical distress     Intensity   THRR 40-80% of Max Heartrate 64-125   Ratings of Perceived Exertion 11-13   Perceived Dyspnea 0-4     Progression   Progression Continue to progress workloads to maintain intensity without signs/symptoms of physical distress.     Resistance Training   Training Prescription Yes   Weight 4lbs   Reps 10-15      Perform Capillary Blood Glucose checks as needed.  Exercise Prescription Changes:      Exercise Prescription Changes    Row Name 06/01/17 1445 06/15/17 1700           Response to Exercise   Blood Pressure (Admit) 144/84 136/80      Blood Pressure (Exercise) 142/80 132/70      Blood Pressure (Exit) 130/82 110/70      Heart Rate (Admit) 56 bpm 57 bpm      Heart Rate (Exercise) 109 bpm 115 bpm      Heart Rate (Exit) 69 bpm 64 bpm      Rating of Perceived Exertion (Exercise) 12 11      Symptoms none none      Duration Continue with 30 min of aerobic exercise without signs/symptoms of physical distress. Continue with 30 min of aerobic exercise without signs/symptoms of physical distress.      Intensity THRR unchanged THRR unchanged        Progression   Progression Continue to progress workloads to maintain intensity without signs/symptoms of physical distress. Continue to progress workloads to maintain intensity without signs/symptoms of physical distress.      Average METs 3.5 5.3        Resistance Training   Training Prescription Yes Yes      Weight 3lbs 4lbs      Reps 10-15 10-15      Time 10 Minutes 10 Minutes        Interval Training   Interval Training No No        Treadmill  MPH 3.2 3.6      Grade 1 4      Minutes 10 10      METs 3.89  5.74        Bike   Level 1.2 2      Minutes 10 10      METs 3.36 4.92        NuStep   Level 4 5      SPM 80 80      Minutes 10 10      METs 3.2 5.1        Home Exercise Plan   Plans to continue exercise at  - Home (comment)      Frequency  - Add 4 additional days to program exercise sessions.      Initial Home Exercises Provided  - 06/05/17         Exercise Comments:      Exercise Comments    Row Name 06/03/17 1650 06/05/17 1537 06/17/17 1655       Exercise Comments Reviewed METs and target heart rate range with patient. Reviewed home exercise guidelines with patient. Reviewed goals with patient.        Exercise Goals and Review:      Exercise Goals    Row Name 05/26/17 1043             Exercise Goals   Increase Physical Activity Yes       Intervention Provide advice, education, support and counseling about physical activity/exercise needs.;Develop an individualized exercise prescription for aerobic and resistive training based on initial evaluation findings, risk stratification, comorbidities and participant's personal goals.       Expected Outcomes Achievement of increased cardiorespiratory fitness and enhanced flexibility, muscular endurance and strength shown through measurements of functional capacity and personal statement of participant.       Increase Strength and Stamina Yes  Be consistent with exercise program       Intervention Provide advice, education, support and counseling about physical activity/exercise needs.;Develop an individualized exercise prescription for aerobic and resistive training based on initial evaluation findings, risk stratification, comorbidities and participant's personal goals.       Expected Outcomes Achievement of increased cardiorespiratory fitness and enhanced flexibility, muscular endurance and strength shown through measurements of functional capacity and personal statement of participant.       Able to understand and use  rate of perceived exertion (RPE) scale Yes       Intervention Provide education and explanation on how to use RPE scale       Expected Outcomes Short Term: Able to use RPE daily in rehab to express subjective intensity level;Long Term:  Able to use RPE to guide intensity level when exercising independently       Knowledge and understanding of Target Heart Rate Range (THRR) Yes       Intervention Provide education and explanation of THRR including how the numbers were predicted and where they are located for reference       Expected Outcomes Short Term: Able to state/look up THRR;Long Term: Able to use THRR to govern intensity when exercising independently;Short Term: Able to use daily as guideline for intensity in rehab       Able to check pulse independently Yes       Intervention Provide education and demonstration on how to check pulse in carotid and radial arteries.;Review the importance of being able to check your own pulse for safety during independent exercise  Expected Outcomes Short Term: Able to explain why pulse checking is important during independent exercise;Long Term: Able to check pulse independently and accurately       Understanding of Exercise Prescription Yes       Intervention Provide education, explanation, and written materials on patient's individual exercise prescription       Expected Outcomes Short Term: Able to explain program exercise prescription;Long Term: Able to explain home exercise prescription to exercise independently          Exercise Goals Re-Evaluation :     Exercise Goals Re-Evaluation    Row Name 06/03/17 1650 06/05/17 1537 06/17/17 1503         Exercise Goal Re-Evaluation   Exercise Goals Review Able to understand and use rate of perceived exertion (RPE) scale;Knowledge and understanding of Target Heart Rate Range (THRR) Understanding of Exercise Prescription Able to understand and use rate of perceived exertion (RPE) scale;Increase Physical  Activity     Comments Reviewed THRR and RPE scale with patient. Reviewed home exercise guidelines with patient. Patient is walking 20-25 minutes, 5 days/week. Patient is able to understand and use RPE scale appropriately.     Expected Outcomes Progress workloads as tolerated to achieve health and fitness goals. Increase walking from 20 minutes 5 days/week to 30 minutes, 5 days/week. Increase walking from 20-25 minutes 5 days/week to 30 minutes, 5 days/week.         Discharge Exercise Prescription (Final Exercise Prescription Changes):     Exercise Prescription Changes - 06/15/17 1700      Response to Exercise   Blood Pressure (Admit) 136/80   Blood Pressure (Exercise) 132/70   Blood Pressure (Exit) 110/70   Heart Rate (Admit) 57 bpm   Heart Rate (Exercise) 115 bpm   Heart Rate (Exit) 64 bpm   Rating of Perceived Exertion (Exercise) 11   Symptoms none   Duration Continue with 30 min of aerobic exercise without signs/symptoms of physical distress.   Intensity THRR unchanged     Progression   Progression Continue to progress workloads to maintain intensity without signs/symptoms of physical distress.   Average METs 5.3     Resistance Training   Training Prescription Yes   Weight 4lbs   Reps 10-15   Time 10 Minutes     Interval Training   Interval Training No     Treadmill   MPH 3.6   Grade 4   Minutes 10   METs 5.74     Bike   Level 2   Minutes 10   METs 4.92     NuStep   Level 5   SPM 80   Minutes 10   METs 5.1     Home Exercise Plan   Plans to continue exercise at Home (comment)   Frequency Add 4 additional days to program exercise sessions.   Initial Home Exercises Provided 06/05/17      Nutrition:  Target Goals: Understanding of nutrition guidelines, daily intake of sodium 1500mg , cholesterol 200mg , calories 30% from fat and 7% or less from saturated fats, daily to have 5 or more servings of fruits and vegetables.  Biometrics:     Pre Biometrics -  05/26/17 1233      Pre Biometrics   Weight 213 lb 3 oz (96.7 kg)   Waist Circumference 40 inches   Hip Circumference 42.75 inches   Waist to Hip Ratio 0.94 %   BMI (Calculated) 28.13   Triceps Skinfold 28 mm   % Body Fat  29.7 %   Grip Strength 45 kg   Flexibility 9.5 in   Single Leg Stand 30 seconds       Nutrition Therapy Plan and Nutrition Goals:     Nutrition Therapy & Goals - 05/27/17 1114      Nutrition Therapy   Diet Therapeutic Lifestyle Changes     Personal Nutrition Goals   Nutrition Goal Pt to identify food quantities necessary to achieve weight loss of 6-24 lb (2.7-10.9 kg) at graduation from cardiac rehab.     Intervention Plan   Intervention Prescribe, educate and counsel regarding individualized specific dietary modifications aiming towards targeted core components such as weight, hypertension, lipid management, diabetes, heart failure and other comorbidities.   Expected Outcomes Short Term Goal: Understand basic principles of dietary content, such as calories, fat, sodium, cholesterol and nutrients.;Long Term Goal: Adherence to prescribed nutrition plan.      Nutrition Discharge: Nutrition Scores:     Nutrition Assessments - 05/27/17 1113      MEDFICTS Scores   Pre Score 0      Nutrition Goals Re-Evaluation:   Nutrition Goals Re-Evaluation:   Nutrition Goals Discharge (Final Nutrition Goals Re-Evaluation):   Psychosocial: Target Goals: Acknowledge presence or absence of significant depression and/or stress, maximize coping skills, provide positive support system. Participant is able to verbalize types and ability to use techniques and skills needed for reducing stress and depression.  Initial Review & Psychosocial Screening:     Initial Psych Review & Screening - 05/26/17 1003      Initial Review   Current issues with None Identified     Family Dynamics   Good Support System? Yes  spouse, family, friends    Comments no psychosocial  needs identified, no interventions necessary      Barriers   Psychosocial barriers to participate in program There are no identifiable barriers or psychosocial needs.     Screening Interventions   Interventions Encouraged to exercise;To provide support and resources with identified psychosocial needs      Quality of Life Scores:     Quality of Life - 05/26/17 1003      Quality of Life Scores   Health/Function Pre 27.6 %   Socioeconomic Pre 28.5 %   Psych/Spiritual Pre 30 %   Family Pre 27.6 %   GLOBAL Pre 28.29 %      PHQ-9: Recent Review Flowsheet Data    Depression screen [facility] 2/9 06/01/2017   Decreased Interest 0   Down, Depressed, Hopeless 0   PHQ - 2 Score 0     Interpretation of Total Score  Total Score Depression Severity:  1-4 = Minimal depression, 5-9 = Mild depression, 10-14 = Moderate depression, 15-19 = Moderately severe depression, 20-27 = Severe depression   Psychosocial Evaluation and Intervention:   Psychosocial Re-Evaluation:     Psychosocial Re-Evaluation    [place] Name 06/18/17 1519             Psychosocial Re-Evaluation   Current issues with None Identified       Interventions Encouraged to attend Cardiac Rehabilitation for the exercise       Continue Psychosocial Services  No Follow up required          Psychosocial Discharge (Final Psychosocial Re-Evaluation):     Psychosocial Re-Evaluation - 06/18/17 1519      Psychosocial Re-Evaluation   Current issues with None Identified   Interventions Encouraged to attend Cardiac Rehabilitation for the exercise   Continue Psychosocial Services  No Follow up required      Vocational Rehabilitation: Provide vocational rehab assistance to qualifying candidates.   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 Participant will state understanding/return demonstration of topics presented.  Learning Barriers/Preferences:     Learning Barriers/Preferences - 05/26/17 1036      Learning Barriers/Preferences   Learning Barriers Sight   Learning Preferences Verbal Instruction;Audio      Education Topics: Count Your Pulse:  -Group instruction provided by verbal instruction, demonstration, patient participation and written materials to support subject.  Instructors address importance of being able to find your pulse and how to count your pulse when at home without a heart monitor.  Patients get hands on experience counting their pulse with staff help and individually.   CARDIAC REHAB PHASE II EXERCISE from 06/19/2017 in [place]  Date  06/19/17  Instruction Review Code  2- meets goals/outcomes      Heart Attack, Angina, and Risk Factor Modification:  -Group instruction provided by verbal instruction, video, and written materials to support subject.  Instructors address signs and symptoms of angina and heart attacks.    Also discuss risk factors for heart disease and how to make changes to improve heart health risk factors.   CARDIAC REHAB PHASE II EXERCISE from 06/19/2017 in [place]  Date  06/17/17  Educator  rn  Instruction Review Code  2- meets goals/outcomes      Functional Fitness:  -Group instruction provided by verbal instruction, demonstration, patient participation, and written materials to support subject.  Instructors address safety measures for doing things around the house.  Discuss how to get up and down off the floor, how to pick things up properly, how to safely get out of a chair without assistance, and balance training.   CARDIAC REHAB PHASE II EXERCISE from 06/19/2017 in [place]  Date  06/05/17  Instruction Review Code  2- meets goals/outcomes      Meditation and Mindfulness:  -Group instruction provided by verbal instruction, patient participation, and written materials to support subject.  Instructor addresses importance of mindfulness and meditation practice to help reduce stress and improve awareness.  Instructor also leads participants through a meditation exercise.    Stretching for Flexibility and Mobility:  -Group instruction provided by verbal instruction, patient participation, and written materials to support subject.  Instructors lead participants through series of stretches that are designed to increase flexibility thus improving mobility.  These stretches are additional exercise for major muscle groups that are typically performed during regular warm up and cool down.   Hands Only CPR:  -Group verbal, video, and participation provides a basic overview of AHA guidelines for community CPR. Role-play of emergencies allow participants the opportunity to practice calling for help and chest compression technique with discussion of AED use.   Hypertension: -Group verbal and written instruction that provides a basic overview of hypertension including the most recent diagnostic guidelines, risk factor reduction with self-care instructions and medication management.    Nutrition I class: Heart Healthy Eating:  -Group instruction provided by PowerPoint slides, verbal discussion, and written materials to support subject matter. The instructor  gives an explanation and review of the Therapeutic Lifestyle Changes diet recommendations, which includes a discussion on lipid goals, dietary fat, sodium, fiber, plant stanol/sterol esters, sugar, and the components of a well-balanced, healthy diet.   Nutrition II class: Lifestyle Skills:  -Group instruction provided by PowerPoint slides, verbal discussion, and written materials to support subject matter.  The instructor gives an explanation and review of label reading, grocery shopping for heart health, heart healthy recipe modifications, and ways to make healthier choices when eating out.   Diabetes Question & Answer:  -Group instruction provided by PowerPoint slides, verbal discussion, and written materials to support subject matter. The instructor gives an explanation and review of diabetes co-morbidities, pre- and post-prandial blood glucose goals, pre-exercise blood glucose goals, signs, symptoms, and treatment of hypoglycemia and hyperglycemia, and foot care basics.   Diabetes Blitz:  -Group instruction provided by PowerPoint slides, verbal discussion, and written materials to support subject matter. The instructor gives an explanation and review of the physiology behind type 1 and type 2 diabetes, diabetes medications and rational behind using different medications, pre- and post-prandial blood glucose recommendations and Hemoglobin A1c goals, diabetes diet, and exercise including blood glucose guidelines for exercising safely.    Portion Distortion:  -Group instruction provided by PowerPoint slides, verbal discussion, written materials, and food models to support subject matter. The instructor gives an explanation of serving size versus portion size, changes in portions sizes over the last 20 years, and what consists of a serving from each food group.   Stress Management:  -Group instruction provided by verbal instruction, video, and written materials to support subject matter.  Instructors review role of stress in heart disease and how to cope with stress positively.     Exercising on Your Own:  -Group instruction provided by verbal instruction, power point, and written materials to support subject.  Instructors discuss benefits of exercise, components of exercise, frequency and intensity of exercise, and end points for exercise.  Also discuss use of nitroglycerin and activating EMS.   Review options of places to exercise outside of rehab.  Review guidelines for sex with heart disease.   Cardiac Drugs I:  -Group instruction provided by verbal instruction and written materials to support subject.  Instructor reviews cardiac drug classes: antiplatelets, anticoagulants, beta blockers, and statins.  Instructor discusses reasons, side effects, and lifestyle considerations for each drug class.   Cardiac Drugs II:  -Group instruction provided by verbal instruction and written materials to support subject.  Instructor reviews cardiac drug classes: angiotensin converting enzyme inhibitors (ACE-I), angiotensin II receptor blockers (ARBs), nitrates, and calcium channel blockers.  Instructor discusses reasons, side effects, and lifestyle considerations for each drug class.   Anatomy and Physiology of the Circulatory System:  Group verbal and written instruction and models provide basic cardiac anatomy and physiology, with the coronary electrical and arterial systems. Review of: AMI, Angina, Valve disease, Heart Failure, Peripheral Artery Disease, Cardiac Arrhythmia, Pacemakers, and the ICD.   Other Education:  -Group or individual verbal, written, or video instructions that support the educational goals of the cardiac rehab program.   Knowledge Questionnaire Score:     Knowledge Questionnaire Score - 05/26/17 1002      Knowledge Questionnaire Score   Pre Score 22/24      Core Components/Risk Factors/Patient Goals at Admission:     Personal Goals and Risk Factors at Admission - 05/26/17 1018      Core Components/Risk Factors/Patient Goals on Admission    Weight Management Weight  Maintenance;Yes;Weight Loss   Intervention Weight Management: Provide education and appropriate resources to help participant work on and attain dietary goals.;Weight Management: Develop a combined nutrition and exercise program designed to reach desired caloric intake, while maintaining appropriate  intake of nutrient and fiber, sodium and fats, and appropriate energy expenditure required for the weight goal.   Admit Weight 212 lb 8.4 oz (96.4 kg)   Goal Weight: Short Term 190 lb (86.2 kg)   Goal Weight: Long Term 175 lb (79.4 kg)   Expected Outcomes Short Term: Continue to assess and modify interventions until short term weight is achieved;Long Term: Adherence to nutrition and physical activity/exercise program aimed toward attainment of established weight goal;Weight Maintenance: Understanding of the daily nutrition guidelines, which includes 25-35% calories from fat, 7% or less cal from saturated fats, less than 200mg  cholesterol, less than 1.5gm of sodium, & 5 or more servings of fruits and vegetables daily;Weight Loss: Understanding of general recommendations for a balanced deficit meal plan, which promotes 1-2 lb weight loss per week and includes a negative energy balance of [phone] kcal/d;Understanding recommendations for meals to include 15-35% energy as protein, 25-35% energy from fat, 35-60% energy from carbohydrates, less than 200mg  of dietary cholesterol, 20-35 gm of total fiber daily;Understanding of distribution of calorie intake throughout the day with the consumption of 4-5 meals/snacks;Weight Gain: Understanding of general recommendations for a high calorie, high protein meal plan that promotes weight gain by distributing calorie intake throughout the day with the consumption for 4-5 meals, snacks, and/or supplements      Core Components/Risk Factors/Patient Goals Review:      Goals and Risk Factor Review    Row Name 06/18/17 1515             Core Components/Risk Factors/Patient Goals Review   Personal Goals Review Weight Management/Obesity       Review -  [name] has maintained his weight and is doing well with exercise.       Expected Outcomes Edd Arbour will continue to participate incardiac rehab and exercise at home to acheive his weight loss goals.          Core  Components/Risk Factors/Patient Goals at Discharge (Final Review):      Goals and Risk Factor Review - 06/18/17 1515      Core Components/Risk Factors/Patient Goals Review   Personal Goals Review Weight Management/Obesity   Review --  [name] has maintained his weight and is doing well with exercise.   Expected Outcomes Edd Arbour will continue to participate incardiac rehab and exercise at home to acheive his weight loss goals.      ITP Comments:     ITP Comments    Row Name 05/26/17 0814           ITP Comments Medical Director, Dr. Fransico Him          Comments: [name] is making expected progress toward personal goals after completing 6 sessions. Recommend continued exercise and life style modification education including  stress management and relaxation techniques to decrease cardiac risk profile. [name] is off to a good start to exercise and is doing well.Barnet Pall, RN,BSN 06/19/2017 5:11 PM

## 2017-06-19 ENCOUNTER — Encounter (HOSPITAL_COMMUNITY)
Admission: RE | Admit: 2017-06-19 | Discharge: 2017-06-19 | Disposition: A | Discharge status: Home / Self Care | Payer: BLUE CROSS/BLUE SHIELD | Source: Ambulatory Visit | Attending: Cardiovascular Disease | Admitting: Cardiovascular Disease

## 2017-06-19 DIAGNOSIS — Z955 Presence of coronary angioplasty implant and graft: Secondary | ICD-10-CM | POA: Diagnosis present

## 2017-06-19 DIAGNOSIS — Z48812 Encounter for surgical aftercare following surgery on the circulatory system: Secondary | ICD-10-CM | POA: Diagnosis not present

## 2017-06-19 DIAGNOSIS — I214 Non-ST elevation (NSTEMI) myocardial infarction: Secondary | ICD-10-CM | POA: Diagnosis present

## 2017-06-22 ENCOUNTER — Encounter (HOSPITAL_COMMUNITY)
Admission: RE | Admit: 2017-06-22 | Discharge: 2017-06-22 | Disposition: A | Discharge status: Home / Self Care | Payer: BLUE CROSS/BLUE SHIELD | Source: Ambulatory Visit | Attending: Cardiovascular Disease | Admitting: Cardiovascular Disease

## 2017-06-22 DIAGNOSIS — I214 Non-ST elevation (NSTEMI) myocardial infarction: Secondary | ICD-10-CM

## 2017-06-22 DIAGNOSIS — Z48812 Encounter for surgical aftercare following surgery on the circulatory system: Secondary | ICD-10-CM | POA: Diagnosis not present

## 2017-06-22 DIAGNOSIS — Z955 Presence of coronary angioplasty implant and graft: Secondary | ICD-10-CM

## 2017-06-24 ENCOUNTER — Encounter (HOSPITAL_COMMUNITY)
Admission: RE | Admit: 2017-06-24 | Discharge: 2017-06-24 | Disposition: A | Discharge status: Home / Self Care | Payer: BLUE CROSS/BLUE SHIELD | Source: Ambulatory Visit | Attending: Cardiovascular Disease | Admitting: Cardiovascular Disease

## 2017-06-24 DIAGNOSIS — Z48812 Encounter for surgical aftercare following surgery on the circulatory system: Secondary | ICD-10-CM | POA: Diagnosis not present

## 2017-06-24 DIAGNOSIS — I214 Non-ST elevation (NSTEMI) myocardial infarction: Secondary | ICD-10-CM

## 2017-06-24 DIAGNOSIS — Z955 Presence of coronary angioplasty implant and graft: Secondary | ICD-10-CM

## 2017-06-26 ENCOUNTER — Encounter (HOSPITAL_COMMUNITY): Payer: BLUE CROSS/BLUE SHIELD

## 2017-06-26 ENCOUNTER — Telehealth (HOSPITAL_COMMUNITY): Payer: Self-pay | Admitting: Family Medicine

## 2017-06-29 ENCOUNTER — Encounter (HOSPITAL_COMMUNITY)
Admission: RE | Admit: 2017-06-29 | Discharge: 2017-06-29 | Disposition: A | Discharge status: Home / Self Care | Payer: BLUE CROSS/BLUE SHIELD | Source: Ambulatory Visit | Attending: Cardiovascular Disease | Admitting: Cardiovascular Disease

## 2017-06-29 DIAGNOSIS — I214 Non-ST elevation (NSTEMI) myocardial infarction: Secondary | ICD-10-CM

## 2017-06-29 DIAGNOSIS — Z955 Presence of coronary angioplasty implant and graft: Secondary | ICD-10-CM

## 2017-06-29 DIAGNOSIS — Z48812 Encounter for surgical aftercare following surgery on the circulatory system: Secondary | ICD-10-CM | POA: Diagnosis not present

## 2017-07-01 ENCOUNTER — Encounter (HOSPITAL_COMMUNITY)
Admission: RE | Admit: 2017-07-01 | Discharge: 2017-07-01 | Disposition: A | Discharge status: Home / Self Care | Payer: BLUE CROSS/BLUE SHIELD | Source: Ambulatory Visit | Attending: Cardiovascular Disease | Admitting: Cardiovascular Disease

## 2017-07-01 DIAGNOSIS — Z955 Presence of coronary angioplasty implant and graft: Secondary | ICD-10-CM

## 2017-07-01 DIAGNOSIS — I214 Non-ST elevation (NSTEMI) myocardial infarction: Secondary | ICD-10-CM

## 2017-07-01 DIAGNOSIS — Z48812 Encounter for surgical aftercare following surgery on the circulatory system: Secondary | ICD-10-CM | POA: Diagnosis not present

## 2017-07-03 ENCOUNTER — Encounter (HOSPITAL_COMMUNITY): Payer: BLUE CROSS/BLUE SHIELD

## 2017-07-06 ENCOUNTER — Encounter (HOSPITAL_COMMUNITY): Payer: BLUE CROSS/BLUE SHIELD

## 2017-07-06 ENCOUNTER — Telehealth (HOSPITAL_COMMUNITY): Payer: Self-pay | Admitting: Family Medicine

## 2017-07-08 ENCOUNTER — Telehealth (HOSPITAL_COMMUNITY): Payer: Self-pay | Admitting: Family Medicine

## 2017-07-08 ENCOUNTER — Encounter (HOSPITAL_COMMUNITY): Payer: BLUE CROSS/BLUE SHIELD

## 2017-07-13 ENCOUNTER — Encounter (HOSPITAL_COMMUNITY)
Admission: RE | Admit: 2017-07-13 | Discharge: 2017-07-13 | Disposition: A | Discharge status: Home / Self Care | Payer: BLUE CROSS/BLUE SHIELD | Source: Ambulatory Visit | Attending: Cardiovascular Disease | Admitting: Cardiovascular Disease

## 2017-07-13 DIAGNOSIS — Z48812 Encounter for surgical aftercare following surgery on the circulatory system: Secondary | ICD-10-CM | POA: Diagnosis not present

## 2017-07-13 DIAGNOSIS — Z955 Presence of coronary angioplasty implant and graft: Secondary | ICD-10-CM

## 2017-07-13 DIAGNOSIS — I214 Non-ST elevation (NSTEMI) myocardial infarction: Secondary | ICD-10-CM

## 2017-07-15 ENCOUNTER — Encounter (HOSPITAL_COMMUNITY)
Admission: RE | Admit: 2017-07-15 | Discharge: 2017-07-15 | Disposition: A | Discharge status: Home / Self Care | Payer: BLUE CROSS/BLUE SHIELD | Source: Ambulatory Visit | Attending: Cardiovascular Disease | Admitting: Cardiovascular Disease

## 2017-07-15 DIAGNOSIS — Z955 Presence of coronary angioplasty implant and graft: Secondary | ICD-10-CM

## 2017-07-15 DIAGNOSIS — I214 Non-ST elevation (NSTEMI) myocardial infarction: Secondary | ICD-10-CM

## 2017-07-15 DIAGNOSIS — Z48812 Encounter for surgical aftercare following surgery on the circulatory system: Secondary | ICD-10-CM | POA: Diagnosis not present

## 2017-07-16 NOTE — Progress Notes (Signed)
Cardiac Individual Treatment Plan  Patient Details  Name: Travis Jenkins MRN: [id number] Date of Birth: [birth year] Referring Provider:     [place] from 05/26/2017 in [place]  Referring Provider  Braulio Bosch MD      Initial Encounter Date:    CARDIAC REHAB PHASE II ORIENTATION from 05/26/2017 in [place]  Date  05/26/17  Referring Provider  Braulio Bosch MD      Visit Diagnosis: NSTEMI (non-ST elevated myocardial infarction) [facility])  Status post coronary artery stent placement  Patient's Home Medications on Admission:  Current Outpatient Medications:  .  aspirin EC 81 MG tablet, Take 81 mg by mouth daily., Disp: , Rfl:  .  diphenhydrAMINE (BENADRYL) 25 mg capsule, Take 1 capsule (25 mg total) by mouth every 6 (six) hours as needed for allergies., Disp: 30 capsule, Rfl: 2 .  nitroGLYCERIN (NITROSTAT) 0.4 MG SL tablet, Place 1 tablet (0.4 mg total) under the tongue every 5 (five) minutes as needed for chest pain., Disp: 30 tablet, Rfl: 0 .  rosuvastatin (CRESTOR) 20 MG tablet, Take 1 tablet (20 mg total) by mouth daily., Disp: 90 tablet, Rfl: 3 .  ticagrelor (BRILINTA) 90 MG TABS tablet, Take 1 tablet (90 mg total) by mouth 2 (two) times daily., Disp: 180 tablet, Rfl: 3 .  traZODone (DESYREL) 100 MG tablet, Take 50-100 mg by mouth at bedtime., Disp: , Rfl: 5  Past Medical History: Past Medical History:  Diagnosis Date  . Bradycardia 05/01/2017  . Coronary artery disease   . IBS (irritable bowel syndrome)   . Raynaud's disease   . Syncope     Tobacco Use: Social History   Tobacco Use  Smoking Status Former Smoker  . Packs/day: 0.50  . Years: 12.00  . Pack years: 6.00  . Types: Cigarettes  . Start date: 06/20/1966  . Last attempt to quit: 08/19/1971  . Years since quitting: 45.9  Smokeless Tobacco Never Used    Labs: Recent Chemical engineer    Labs for ITP Cardiac  and Pulmonary Rehab Latest Ref Rng & Units 04/28/2017 05/02/2017   Cholestrol 0 - 200 mg/dL - 132   LDLCALC 0 - 99 mg/dL - 64   HDL >40 mg/dL - 46   Trlycerides <150 mg/dL - 108   TCO2 22 - 32 mmol/L 26 -      Capillary Blood Glucose: Lab Results  Component Value Date   GLUCAP 139 (H) 05/01/2017     Exercise Target Goals:    Exercise Program Goal: Individual exercise prescription set with THRR, safety & activity barriers. Participant demonstrates ability to understand and report RPE using BORG scale, to self-measure pulse accurately, and to acknowledge the importance of the exercise prescription.  Exercise Prescription Goal: Starting with aerobic activity 30 plus minutes a day, 3 days per week for initial exercise prescription. Provide home exercise prescription and guidelines that participant acknowledges understanding prior to discharge.  Activity Barriers & Risk Stratification: Activity Barriers & Cardiac Risk Stratification - 05/26/17 1257      Activity Barriers & Cardiac Risk Stratification   Activity Barriers  Back Problems;Other (comment)    Comments  L knee scope    Cardiac Risk Stratification  High       6 Minute Walk: 6 Minute Walk    Row Name 05/26/17 1037 05/26/17 1159       6 Minute Walk   Phase  Initial  -  Distance  2036 feet  -    Walk Time  6 minutes  -    # of Rest Breaks  0  -    MPH  3.86  -    METS  4.72  -    RPE  11  -    VO2 Peak  16.5  -    Resting HR  75 bpm  -    Resting BP  130/78  -    Resting Oxygen Saturation   95 % RA  -    Exercise Oxygen Saturation  during 6 min walk  99 % RA  -    Max Ex. HR  109 bpm  -    Max Ex. BP  140/88  -    2 Minute Post BP  114/78  -       Oxygen Initial Assessment:   Oxygen Re-Evaluation:   Oxygen Discharge (Final Oxygen Re-Evaluation):   Initial Exercise Prescription: Initial Exercise Prescription - 05/26/17 1000      Date of Initial Exercise RX and Referring Provider   Date  05/26/17     Referring Provider  Braulio Bosch MD      Treadmill   MPH  3.2    Grade  1    Minutes  10    METs  3.89      Bike   Level  1.2    Minutes  10    METs  3.38      NuStep   Level  4    SPM  80    Minutes  10    METs  3      Prescription Details   Frequency (times per week)  3    Duration  Progress to 30 minutes of continuous aerobic without signs/symptoms of physical distress      Intensity   THRR 40-80% of Max Heartrate  64-125    Ratings of Perceived Exertion  11-13    Perceived Dyspnea  0-4      Progression   Progression  Continue to progress workloads to maintain intensity without signs/symptoms of physical distress.      Resistance Training   Training Prescription  Yes    Weight  4lbs    Reps  10-15       Perform Capillary Blood Glucose checks as needed.  Exercise Prescription Changes:  Exercise Prescription Changes    Row Name 06/01/17 1445 06/15/17 1700 07/01/17 1600 07/15/17 1600       Response to Exercise   Blood Pressure (Admit)  144/84  136/80  118/82  120/84    Blood Pressure (Exercise)  142/80  132/70  148/82  158/80    Blood Pressure (Exit)  130/82  110/70  124/82  130/88    Heart Rate (Admit)  56 bpm  57 bpm  60 bpm  64 bpm    Heart Rate (Exercise)  109 bpm  115 bpm  129 bpm  133 bpm    Heart Rate (Exit)  69 bpm  64 bpm  71 bpm  67 bpm    Rating of Perceived Exertion (Exercise)  12  11  12  13     Symptoms  none  none  none  none    Duration  Continue with 30 min of aerobic exercise without signs/symptoms of physical distress.  Continue with 30 min of aerobic exercise without signs/symptoms of physical distress.  Continue with 30 min of aerobic exercise without signs/symptoms of physical distress.  Continue with 30 min of aerobic exercise without signs/symptoms of physical distress.    Intensity  THRR unchanged  THRR unchanged  THRR unchanged  THRR unchanged      Progression   Progression  Continue to progress workloads to maintain intensity  without signs/symptoms of physical distress.  Continue to progress workloads to maintain intensity without signs/symptoms of physical distress.  Continue to progress workloads to maintain intensity without signs/symptoms of physical distress.  Continue to progress workloads to maintain intensity without signs/symptoms of physical distress.    Average METs  3.5  5.3  5.4  5.2      Resistance Training   Training Prescription  Yes  Yes  Yes  No Relaxtion today    Weight  3lbs  4lbs  5lbs  -    Reps  10-15  10-15  10-15  -    Time  10 Minutes  10 Minutes  10 Minutes  -      Interval Training   Interval Training  No  No  No  -      Treadmill   MPH  3.2  3.6  3.8  3.8    Grade  1  4  4  5     Minutes  10  10  10  10     METs  3.89  5.74  6  6.53      Bike   Level  1.2  2  -  -    Minutes  10  10  -  -    METs  3.36  4.92  -  -      NuStep   Level  4  5  5  5     SPM  80  80  80  80    Minutes  10  10  10  10     METs  3.2  5.1  4  4       Rower   Level  -  -  -  5    Watts  -  -  77  45    Minutes  -  -  10  10    METs  -  -  5.4  5.2      Home Exercise Plan   Plans to continue exercise at  -  Home (comment)  Home (comment)  Home (comment)    Frequency  -  Add 4 additional days to program exercise sessions.  Add 4 additional days to program exercise sessions.  Add 4 additional days to program exercise sessions.    Initial Home Exercises Provided  -  06/05/17  06/05/17  06/05/17       Exercise Comments:  Exercise Comments    Row Name 06/03/17 1650 06/05/17 1537 06/17/17 1655 07/15/17 1454     Exercise Comments  Reviewed METs and target heart rate range with patient.  Reviewed home exercise guidelines with patient.  Reviewed goals with patient.  Reviewed METs with patient.       Exercise Goals and Review:  Exercise Goals    Row Name 05/26/17 1043             Exercise Goals   Increase Physical Activity  Yes       Intervention  Provide advice, education, support and  counseling about physical activity/exercise needs.;Develop an individualized exercise prescription for aerobic and resistive training based on initial evaluation findings, risk stratification, comorbidities and participant's personal goals.  Expected Outcomes  Achievement of increased cardiorespiratory fitness and enhanced flexibility, muscular endurance and strength shown through measurements of functional capacity and personal statement of participant.       Increase Strength and Stamina  Yes Be consistent with exercise program       Intervention  Provide advice, education, support and counseling about physical activity/exercise needs.;Develop an individualized exercise prescription for aerobic and resistive training based on initial evaluation findings, risk stratification, comorbidities and participant's personal goals.       Expected Outcomes  Achievement of increased cardiorespiratory fitness and enhanced flexibility, muscular endurance and strength shown through measurements of functional capacity and personal statement of participant.       Able to understand and use rate of perceived exertion (RPE) scale  Yes       Intervention  Provide education and explanation on how to use RPE scale       Expected Outcomes  Short Term: Able to use RPE daily in rehab to express subjective intensity level;Long Term:  Able to use RPE to guide intensity level when exercising independently       Knowledge and understanding of Target Heart Rate Range (THRR)  Yes       Intervention  Provide education and explanation of THRR including how the numbers were predicted and where they are located for reference       Expected Outcomes  Short Term: Able to state/look up THRR;Long Term: Able to use THRR to govern intensity when exercising independently;Short Term: Able to use daily as guideline for intensity in rehab       Able to check pulse independently  Yes       Intervention  Provide education and demonstration on  how to check pulse in carotid and radial arteries.;Review the importance of being able to check your own pulse for safety during independent exercise       Expected Outcomes  Short Term: Able to explain why pulse checking is important during independent exercise;Long Term: Able to check pulse independently and accurately       Understanding of Exercise Prescription  Yes       Intervention  Provide education, explanation, and written materials on patient's individual exercise prescription       Expected Outcomes  Short Term: Able to explain program exercise prescription;Long Term: Able to explain home exercise prescription to exercise independently          Exercise Goals Re-Evaluation : Exercise Goals Re-Evaluation    Row Name 06/03/17 1650 06/05/17 1537 06/17/17 1503 07/15/17 1454       Exercise Goal Re-Evaluation   Exercise Goals Review  Able to understand and use rate of perceived exertion (RPE) scale;Knowledge and understanding of Target Heart Rate Range (THRR)  Understanding of Exercise Prescription  Able to understand and use rate of perceived exertion (RPE) scale;Increase Physical Activity  Increase Physical Activity    Comments  Reviewed THRR and RPE scale with patient.  Reviewed home exercise guidelines with patient.  Patient is walking 20-25 minutes, 5 days/week. Patient is able to understand and use RPE scale appropriately.  Patient is making excellent progress with exercise. Switched from bike to rower and tolerating well.    Expected Outcomes  Progress workloads as tolerated to achieve health and fitness goals.  Increase walking from 20 minutes 5 days/week to 30 minutes, 5 days/week.  Increase walking from 20-25 minutes 5 days/week to 30 minutes, 5 days/week.  Continue exercise prescription, increasing workloads as tolerated.  Discharge Exercise Prescription (Final Exercise Prescription Changes): Exercise Prescription Changes - 07/15/17 1600      Response to Exercise    Blood Pressure (Admit)  120/84    Blood Pressure (Exercise)  158/80    Blood Pressure (Exit)  130/88    Heart Rate (Admit)  64 bpm    Heart Rate (Exercise)  133 bpm    Heart Rate (Exit)  67 bpm    Rating of Perceived Exertion (Exercise)  13    Symptoms  none    Duration  Continue with 30 min of aerobic exercise without signs/symptoms of physical distress.    Intensity  THRR unchanged      Progression   Progression  Continue to progress workloads to maintain intensity without signs/symptoms of physical distress.    Average METs  5.2      Resistance Training   Training Prescription  No Relaxtion today    Weight  --    Reps  --    Time  --      Interval Training   Interval Training  --      Treadmill   MPH  3.8    Grade  5    Minutes  10    METs  6.53      NuStep   Level  5    SPM  80    Minutes  10    METs  4      Rower   Level  5    Watts  45    Minutes  10    METs  5.2      Home Exercise Plan   Plans to continue exercise at  Home (comment)    Frequency  Add 4 additional days to program exercise sessions.    Initial Home Exercises Provided  06/05/17       Nutrition:  Target Goals: Understanding of nutrition guidelines, daily intake of sodium 1500mg , cholesterol 200mg , calories 30% from fat and 7% or less from saturated fats, daily to have 5 or more servings of fruits and vegetables.  Biometrics: Pre Biometrics - 05/26/17 1233      Pre Biometrics   Weight  213 lb 3 oz (96.7 kg)    Waist Circumference  40 inches    Hip Circumference  42.75 inches    Waist to Hip Ratio  0.94 %    BMI (Calculated)  28.13    Triceps Skinfold  28 mm    % Body Fat  29.7 %    Grip Strength  45 kg    Flexibility  9.5 in    Single Leg Stand  30 seconds        Nutrition Therapy Plan and Nutrition Goals: Nutrition Therapy & Goals - 05/27/17 1114      Nutrition Therapy   Diet  Therapeutic Lifestyle Changes      Personal Nutrition Goals   Nutrition Goal  Pt to identify  food quantities necessary to achieve weight loss of 6-24 lb (2.7-10.9 kg) at graduation from cardiac rehab.      Intervention Plan   Intervention  Prescribe, educate and counsel regarding individualized specific dietary modifications aiming towards targeted core components such as weight, hypertension, lipid management, diabetes, heart failure and other comorbidities.    Expected Outcomes  Short Term Goal: Understand basic principles of dietary content, such as calories, fat, sodium, cholesterol and nutrients.;Long Term Goal: Adherence to prescribed nutrition plan.       Nutrition Discharge: Nutrition Scores:  Nutrition Assessments - 05/27/17 1113      MEDFICTS Scores   Pre Score  0       Nutrition Goals Re-Evaluation:   Nutrition Goals Re-Evaluation:   Nutrition Goals Discharge (Final Nutrition Goals Re-Evaluation):   Psychosocial: Target Goals: Acknowledge presence or absence of significant depression and/or stress, maximize coping skills, provide positive support system. Participant is able to verbalize types and ability to use techniques and skills needed for reducing stress and depression.  Initial Review & Psychosocial Screening: Initial Psych Review & Screening - 05/26/17 1003      Initial Review   Current issues with  None Identified      Family Dynamics   Good Support System?  Yes spouse, family, friends     Comments  no psychosocial needs identified, no interventions necessary       Barriers   Psychosocial barriers to participate in program  There are no identifiable barriers or psychosocial needs.      Screening Interventions   Interventions  Encouraged to exercise;To provide support and resources with identified psychosocial needs       Quality of Life Scores: Quality of Life - 05/26/17 1003      Quality of Life Scores   Health/Function Pre  27.6 %    Socioeconomic Pre  28.5 %    Psych/Spiritual Pre  30 %    Family Pre  27.6 %    GLOBAL Pre  28.29 %        PHQ-9: Recent Review Flowsheet Data    Depression screen [facility] 2/9 06/01/2017   Decreased Interest 0   Down, Depressed, Hopeless 0   PHQ - 2 Score 0     Interpretation of Total Score  Total Score Depression Severity:  1-4 = Minimal depression, 5-9 = Mild depression, 10-14 = Moderate depression, 15-19 = Moderately severe depression, 20-27 = Severe depression   Psychosocial Evaluation and Intervention:   Psychosocial Re-Evaluation: Psychosocial Re-Evaluation    [place] Name 06/18/17 1519 07/16/17 1757           Psychosocial Re-Evaluation   Current issues with  None Identified  None Identified      Interventions  Encouraged to attend Cardiac Rehabilitation for the exercise  Encouraged to attend Cardiac Rehabilitation for the exercise      Continue Psychosocial Services   No Follow up required  No Follow up required         Psychosocial Discharge (Final Psychosocial Re-Evaluation): Psychosocial Re-Evaluation - 07/16/17 1757      Psychosocial Re-Evaluation   Current issues with  None Identified    Interventions  Encouraged to attend Cardiac Rehabilitation for the exercise    Continue Psychosocial Services   No Follow up required       Vocational Rehabilitation: Provide vocational rehab assistance to qualifying candidates.   Vocational Rehab Evaluation & Intervention: Vocational Rehab - 05/26/17 1002      Initial Vocational Rehab Evaluation & Intervention   Assessment shows need for Vocational Rehabilitation  No Financial planner        Education: Education Goals: Education classes will be provided on a weekly basis, covering required topics. Participant will state understanding/return demonstration of topics presented.  Learning Barriers/Preferences: Learning Barriers/Preferences - 05/26/17 1036      Learning Barriers/Preferences   Learning Barriers  Sight    Learning Preferences  Verbal Instruction;Audio       Education Topics: Count Your Pulse:  -Group  instruction provided by verbal instruction, demonstration, patient participation and  written materials to support subject.  Instructors address importance of being able to find your pulse and how to count your pulse when at home without a heart monitor.  Patients get hands on experience counting their pulse with staff help and individually.   CARDIAC REHAB PHASE II EXERCISE from 06/24/2017 in [place]  Date  06/19/17  Instruction Review Code  2- meets goals/outcomes      Heart Attack, Angina, and Risk Factor Modification:  -Group instruction provided by verbal instruction, video, and written materials to support subject.  Instructors address signs and symptoms of angina and heart attacks.    Also discuss risk factors for heart disease and how to make changes to improve heart health risk factors.   CARDIAC REHAB PHASE II EXERCISE from 06/24/2017 in [place]  Date  06/17/17  Educator  rn  Instruction Review Code  2- meets goals/outcomes      Functional Fitness:  -Group instruction provided by verbal instruction, demonstration, patient participation, and written materials to support subject.  Instructors address safety measures for doing things around the house.  Discuss how to get up and down off the floor, how to pick things up properly, how to safely get out of a chair without assistance, and balance training.   CARDIAC REHAB PHASE II EXERCISE from 06/24/2017 in [place]  Date  06/05/17  Instruction Review Code  2- meets goals/outcomes      Meditation and Mindfulness:  -Group instruction provided by verbal instruction, patient participation, and written materials to support subject.  Instructor addresses importance of mindfulness and meditation practice to help reduce stress and improve awareness.  Instructor also leads participants through a meditation exercise.    Stretching for  Flexibility and Mobility:  -Group instruction provided by verbal instruction, patient participation, and written materials to support subject.  Instructors lead participants through series of stretches that are designed to increase flexibility thus improving mobility.  These stretches are additional exercise for major muscle groups that are typically performed during regular warm up and cool down.   Hands Only CPR:  -Group verbal, video, and participation provides a basic overview of AHA guidelines for community CPR. Role-play of emergencies allow participants the opportunity to practice calling for help and chest compression technique with discussion of AED use.   Hypertension: -Group verbal and written instruction that provides a basic overview of hypertension including the most recent diagnostic guidelines, risk factor reduction with self-care instructions and medication management.    Nutrition I class: Heart Healthy Eating:  -Group instruction provided by PowerPoint slides, verbal discussion, and written materials to support subject matter. The instructor gives an explanation and review of the Therapeutic Lifestyle Changes diet recommendations, which includes a discussion on lipid goals, dietary fat, sodium, fiber, plant stanol/sterol esters, sugar, and the components of a well-balanced, healthy diet.   Nutrition II class: Lifestyle Skills:  -Group instruction provided by PowerPoint slides, verbal discussion, and written materials to support subject matter. The instructor gives an explanation and review of label reading, grocery shopping for heart health, heart healthy recipe modifications, and ways to make healthier choices when eating out.   Diabetes Question & Answer:  -Group instruction provided by PowerPoint slides, verbal discussion, and written materials to support subject matter. The instructor gives an explanation and review of diabetes co-morbidities, pre- and post-prandial blood  glucose goals, pre-exercise blood glucose goals, signs, symptoms, and treatment of hypoglycemia and hyperglycemia, and  foot care basics.   Diabetes Blitz:  -Group instruction provided by PowerPoint slides, verbal discussion, and written materials to support subject matter. The instructor gives an explanation and review of the physiology behind type 1 and type 2 diabetes, diabetes medications and rational behind using different medications, pre- and post-prandial blood glucose recommendations and Hemoglobin A1c goals, diabetes diet, and exercise including blood glucose guidelines for exercising safely.    Portion Distortion:  -Group instruction provided by PowerPoint slides, verbal discussion, written materials, and food models to support subject matter. The instructor gives an explanation of serving size versus portion size, changes in portions sizes over the last 20 years, and what consists of a serving from each food group.   Stress Management:  -Group instruction provided by verbal instruction, video, and written materials to support subject matter.  Instructors review role of stress in heart disease and how to cope with stress positively.     Exercising on Your Own:  -Group instruction provided by verbal instruction, power point, and written materials to support subject.  Instructors discuss benefits of exercise, components of exercise, frequency and intensity of exercise, and end points for exercise.  Also discuss use of nitroglycerin and activating EMS.  Review options of places to exercise outside of rehab.  Review guidelines for sex with heart disease.   CARDIAC REHAB PHASE II EXERCISE from 06/24/2017 in [place]  Date  06/24/17  Educator  EP  Instruction Review Code  2- meets goals/outcomes      Cardiac Drugs I:  -Group instruction provided by verbal instruction and written materials to support subject.  Instructor reviews cardiac drug classes:  antiplatelets, anticoagulants, beta blockers, and statins.  Instructor discusses reasons, side effects, and lifestyle considerations for each drug class.   Cardiac Drugs II:  -Group instruction provided by verbal instruction and written materials to support subject.  Instructor reviews cardiac drug classes: angiotensin converting enzyme inhibitors (ACE-I), angiotensin II receptor blockers (ARBs), nitrates, and calcium channel blockers.  Instructor discusses reasons, side effects, and lifestyle considerations for each drug class.   Anatomy and Physiology of the Circulatory System:  Group verbal and written instruction and models provide basic cardiac anatomy and physiology, with the coronary electrical and arterial systems. Review of: AMI, Angina, Valve disease, Heart Failure, Peripheral Artery Disease, Cardiac Arrhythmia, Pacemakers, and the ICD.   Other Education:  -Group or individual verbal, written, or video instructions that support the educational goals of the cardiac rehab program.   Knowledge Questionnaire Score: Knowledge Questionnaire Score - 05/26/17 1002      Knowledge Questionnaire Score   Pre Score  22/24       Core Components/Risk Factors/Patient Goals at Admission: Personal Goals and Risk Factors at Admission - 05/26/17 1018      Core Components/Risk Factors/Patient Goals on Admission    Weight Management  Weight Maintenance;Yes;Weight Loss    Intervention  Weight Management: Provide education and appropriate resources to help participant work on and attain dietary goals.;Weight Management: Develop a combined nutrition and exercise program designed to reach desired caloric intake, while maintaining appropriate intake of nutrient and fiber, sodium and fats, and appropriate energy expenditure required for the weight goal.    Admit Weight  212 lb 8.4 oz (96.4 kg)    Goal Weight: Short Term  190 lb (86.2 kg)    Goal Weight: Long Term  175 lb (79.4 kg)    Expected Outcomes   Short Term: Continue to assess and modify interventions  until short term weight is achieved;Long Term: Adherence to nutrition and physical activity/exercise program aimed toward attainment of established weight goal;Weight Maintenance: Understanding of the daily nutrition guidelines, which includes 25-35% calories from fat, 7% or less cal from saturated fats, less than 200mg  cholesterol, less than 1.5gm of sodium, & 5 or more servings of fruits and vegetables daily;Weight Loss: Understanding of general recommendations for a balanced deficit meal plan, which promotes 1-2 lb weight loss per week and includes a negative energy balance of [phone] kcal/d;Understanding recommendations for meals to include 15-35% energy as protein, 25-35% energy from fat, 35-60% energy from carbohydrates, less than 200mg  of dietary cholesterol, 20-35 gm of total fiber daily;Understanding of distribution of calorie intake throughout the day with the consumption of 4-5 meals/snacks;Weight Gain: Understanding of general recommendations for a high calorie, high protein meal plan that promotes weight gain by distributing calorie intake throughout the day with the consumption for 4-5 meals, snacks, and/or supplements       Core Components/Risk Factors/Patient Goals Review:  Goals and Risk Factor Review    Row Name 06/18/17 1515 07/16/17 1757           Core Components/Risk Factors/Patient Goals Review   Personal Goals Review  Weight Management/Obesity  Weight Management/Obesity      Review  - [name] has maintained his weight and is doing well with exercise.  [name] is doing well with exercise, [name]'s vital signs have been stable      Expected Outcomes  Edd Arbour will continue to participate incardiac rehab and exercise at home to acheive his weight loss goals.  Edd Arbour will continue to participate incardiac rehab and exercise at home to acheive his weight loss goals.         Core Components/Risk Factors/Patient Goals at Discharge (Final  Review):  Goals and Risk Factor Review - 07/16/17 1757      Core Components/Risk Factors/Patient Goals Review   Personal Goals Review  Weight Management/Obesity    Review  [name] is doing well with exercise, [name]'s vital signs have been stable    Expected Outcomes  Edd Arbour will continue to participate incardiac rehab and exercise at home to acheive his weight loss goals.       ITP Comments: ITP Comments    Row Name 05/26/17 0814 07/16/17 1755         ITP Comments  Medical Director, Dr. Fransico Him  30 Day ITP Patient with good participation and attends exercise when he can. [name] has some conflicts with his work schedule         Comments: See ITP comments.Barnet Pall, RN,BSN 07/17/2017 8:55 AM

## 2017-07-17 ENCOUNTER — Encounter (HOSPITAL_COMMUNITY): Payer: BLUE CROSS/BLUE SHIELD

## 2017-07-20 ENCOUNTER — Encounter (HOSPITAL_COMMUNITY)
Admission: RE | Admit: 2017-07-20 | Discharge: 2017-07-20 | Disposition: A | Discharge status: Home / Self Care | Payer: BLUE CROSS/BLUE SHIELD | Source: Ambulatory Visit | Attending: Cardiovascular Disease | Admitting: Cardiovascular Disease

## 2017-07-20 DIAGNOSIS — I214 Non-ST elevation (NSTEMI) myocardial infarction: Secondary | ICD-10-CM

## 2017-07-20 DIAGNOSIS — Z48812 Encounter for surgical aftercare following surgery on the circulatory system: Secondary | ICD-10-CM | POA: Insufficient documentation

## 2017-07-20 DIAGNOSIS — Z955 Presence of coronary angioplasty implant and graft: Secondary | ICD-10-CM

## 2017-07-22 ENCOUNTER — Encounter (HOSPITAL_COMMUNITY)
Admission: RE | Admit: 2017-07-22 | Discharge: 2017-07-22 | Disposition: A | Discharge status: Home / Self Care | Payer: BLUE CROSS/BLUE SHIELD | Source: Ambulatory Visit | Attending: Cardiovascular Disease | Admitting: Cardiovascular Disease

## 2017-07-22 VITALS — BP 126/82 | HR 81 | Ht 73.0 in | Wt 212.7 lb

## 2017-07-22 DIAGNOSIS — I214 Non-ST elevation (NSTEMI) myocardial infarction: Secondary | ICD-10-CM

## 2017-07-22 DIAGNOSIS — Z955 Presence of coronary angioplasty implant and graft: Secondary | ICD-10-CM

## 2017-07-22 DIAGNOSIS — Z48812 Encounter for surgical aftercare following surgery on the circulatory system: Secondary | ICD-10-CM | POA: Diagnosis not present

## 2017-07-22 NOTE — Progress Notes (Signed)
[name] 66 y.o. male DOB 02/03/1951 MRN [id number]       Nutrition  1. NSTEMI (non-ST elevated myocardial infarction) ([place])   2. Status post coronary artery stent placement   Note Spoke with pt. Nutrition Plan and Nutrition Survey goals reviewed with pt. Pt is following a Heart Healthy diet. Pt expressed understanding of the information reviewed. Pt aware of nutrition education classes offered. Nutrition Diagnosis ? Food-and nutrition-related knowledge deficit related to lack of exposure to information as related to diagnosis of: ? CVD  ? Overweight related to excessive energy intake as evidenced by a BMI of 28.1 Nutrition Intervention ? Pt's individual nutrition plan reviewed with pt. ? Benefits of adopting Heart Healthy diet discussed when Medficts reviewed.   ? Pt given handouts for: ? Nutrition I class ? Nutrition II class  Nutrition Goal(s):  Pt to identify food quantities necessary to achieve weight loss of 6-24 lb (2.7-10.9 kg) at graduation from cardiac rehab.  Plan:  Pt to attend nutrition classes ? Nutrition I ? Nutrition II ? Portion Distortion  Will provide client-centered nutrition education as part of interdisciplinary care.   Monitor and evaluate progress toward nutrition goal with team.  Derek Mound, M.Ed, RD, LDN, CDE 07/22/2017 3:56 PM

## 2017-07-24 ENCOUNTER — Encounter (HOSPITAL_COMMUNITY): Payer: BLUE CROSS/BLUE SHIELD

## 2017-07-27 ENCOUNTER — Encounter (HOSPITAL_COMMUNITY): Payer: BLUE CROSS/BLUE SHIELD

## 2017-07-29 ENCOUNTER — Encounter (HOSPITAL_COMMUNITY): Payer: BLUE CROSS/BLUE SHIELD

## 2017-07-31 ENCOUNTER — Encounter (HOSPITAL_COMMUNITY): Payer: BLUE CROSS/BLUE SHIELD

## 2017-08-03 ENCOUNTER — Encounter (HOSPITAL_COMMUNITY): Payer: BLUE CROSS/BLUE SHIELD

## 2017-08-05 ENCOUNTER — Encounter (HOSPITAL_COMMUNITY): Payer: BLUE CROSS/BLUE SHIELD

## 2017-08-07 ENCOUNTER — Encounter (HOSPITAL_COMMUNITY)
Admission: RE | Admit: 2017-08-07 | Discharge: 2017-08-07 | Disposition: A | Discharge status: Home / Self Care | Payer: BLUE CROSS/BLUE SHIELD | Source: Ambulatory Visit | Attending: Cardiovascular Disease | Admitting: Cardiovascular Disease

## 2017-08-07 DIAGNOSIS — Z955 Presence of coronary angioplasty implant and graft: Secondary | ICD-10-CM

## 2017-08-07 DIAGNOSIS — I214 Non-ST elevation (NSTEMI) myocardial infarction: Secondary | ICD-10-CM

## 2017-08-10 ENCOUNTER — Encounter (HOSPITAL_COMMUNITY): Payer: BLUE CROSS/BLUE SHIELD

## 2017-08-12 ENCOUNTER — Encounter (HOSPITAL_COMMUNITY): Payer: BLUE CROSS/BLUE SHIELD

## 2017-08-14 ENCOUNTER — Encounter (HOSPITAL_COMMUNITY): Admission: RE | Admit: 2017-08-14 | Payer: BLUE CROSS/BLUE SHIELD | Source: Ambulatory Visit

## 2017-08-14 ENCOUNTER — Encounter (HOSPITAL_COMMUNITY): Payer: Self-pay | Admitting: *Deleted

## 2017-08-14 DIAGNOSIS — Z955 Presence of coronary angioplasty implant and graft: Secondary | ICD-10-CM

## 2017-08-14 DIAGNOSIS — I214 Non-ST elevation (NSTEMI) myocardial infarction: Secondary | ICD-10-CM

## 2017-08-14 NOTE — Progress Notes (Signed)
Discharge Progress Report  Patient Details  Name: Travis Jenkins MRN: [id number] Date of Birth: [birth year] Referring Provider:     CARDIAC REHAB PHASE II ORIENTATION from 05/26/2017 in [facility]  Referring Provider  Braulio Bosch MD       Number of Visits: 15  Reason for Discharge:  Early Exit:  Insurance and Back to work  Smoking History:  Social History   Tobacco Use  Smoking Status Former Smoker  . Packs/day: 0.50  . Years: 12.00  . Pack years: 6.00  . Types: Cigarettes  . Start date: 06/20/1966  . Last attempt to quit: 08/19/1971  . Years since quitting: 46.0  Smokeless Tobacco Never Used    Diagnosis:  NSTEMI (non-ST elevated myocardial infarction) ([place])  Status post coronary artery stent placement  ADL UCSD:   Initial Exercise Prescription:   Discharge Exercise Prescription (Final Exercise Prescription Changes): Exercise Prescription Changes - 07/22/17 1459      Response to Exercise   Blood Pressure (Admit)  126/82    Blood Pressure (Exercise)  118/60    Blood Pressure (Exit)  106/78    Heart Rate (Admit)  81 bpm    Heart Rate (Exercise)  125 bpm    Heart Rate (Exit)  75 bpm    Rating of Perceived Exertion (Exercise)  12.5    Symptoms  none    Duration  Continue with 30 min of aerobic exercise without signs/symptoms of physical distress.    Intensity  THRR unchanged      Progression   Progression  Continue to progress workloads to maintain intensity without signs/symptoms of physical distress.    Average METs  5.6      Resistance Training   Training Prescription  No Relaxtion today      Treadmill   MPH  3.8    Grade  5    Minutes  10    METs  6.53      NuStep   Level  6    SPM  85    Minutes  10    METs  4.2      Rower   Level  5    Watts  74    Minutes  10    METs  6.1      Home Exercise Plan   Plans to continue exercise at  Home (comment)    Frequency  Add 4 additional days to program exercise  sessions.    Initial Home Exercises Provided  06/05/17       Functional Capacity:   Psychological, QOL, Others - Outcomes: PHQ 2/9: Depression screen PHQ 2/9 06/01/2017  Decreased Interest 0  Down, Depressed, Hopeless 0  PHQ - 2 Score 0    Quality of Life:   Personal Goals: Goals established at orientation with interventions provided to work toward goal.    Personal Goals Discharge: Goals and Risk Factor Review    Row Name 06/18/17 1515 07/16/17 1757 08/17/17 0945         Core Components/Risk Factors/Patient Goals Review   Personal Goals Review  Weight Management/Obesity  Weight Management/Obesity  Weight Management/Obesity     Review  - Travis Jenkins has maintained his weight and is doing well with exercise.  Travis Jenkins is doing well with exercise, Travis Jenkins's vital signs have been stable  Pt maintained his wt.      Expected Outcomes  Travis Jenkins will continue to participate incardiac rehab and exercise at home to acheive his weight loss  goals.  Travis Jenkins will continue to participate incardiac rehab and exercise at home to acheive his weight loss goals.  See nutrition goal section.         Exercise Goals and Review:   Nutrition & Weight - Outcomes:  Post Biometrics - 07/22/17 1459       Post  Biometrics   Height  6\' 1"  (1.854 m)    Weight  212 lb 11.9 oz (96.5 kg)    BMI (Calculated)  28.07       Nutrition:   Nutrition Discharge:   Education Questionnaire Score:   Travis Jenkins's work schedule has changed. Travis Jenkins plans to exercise on his own. Travis Jenkins did well with exercise when he attended from 05/26/17-07/22/17. Travis Jenkins's attendance was fair. Travis Jenkins did have some variances in attendance due to work schedule.Barnet Pall, RN,BSN 08/28/2017 2:49 PM

## 2017-08-17 ENCOUNTER — Encounter (HOSPITAL_COMMUNITY): Payer: BLUE CROSS/BLUE SHIELD

## 2017-08-17 NOTE — Addendum Note (Signed)
Encounter addended by: Sol Passer on: 08/17/2017 8:54 AM  Actions taken: Flowsheet data copied forward, Visit Navigator Flowsheet section accepted, Vitals modified

## 2017-08-17 NOTE — Addendum Note (Signed)
Encounter addended by: Jewel Baize, RD on: 08/17/2017 9:46 AM  Actions taken: Flowsheet data copied forward, Visit Navigator Flowsheet section accepted

## 2017-08-19 ENCOUNTER — Encounter (HOSPITAL_COMMUNITY): Payer: BLUE CROSS/BLUE SHIELD

## 2017-08-21 ENCOUNTER — Encounter (HOSPITAL_COMMUNITY): Payer: BLUE CROSS/BLUE SHIELD

## 2017-08-24 ENCOUNTER — Encounter (HOSPITAL_COMMUNITY): Payer: BLUE CROSS/BLUE SHIELD

## 2017-08-25 ENCOUNTER — Encounter: Payer: Self-pay | Admitting: Cardiovascular Disease

## 2017-08-25 ENCOUNTER — Ambulatory Visit (INDEPENDENT_AMBULATORY_CARE_PROVIDER_SITE_OTHER): Payer: BLUE CROSS/BLUE SHIELD | Admitting: Cardiovascular Disease

## 2017-08-25 VITALS — BP 122/68 | HR 57 | Ht 73.0 in | Wt 212.0 lb

## 2017-08-25 DIAGNOSIS — E7800 Pure hypercholesterolemia, unspecified: Secondary | ICD-10-CM

## 2017-08-25 DIAGNOSIS — I2510 Atherosclerotic heart disease of native coronary artery without angina pectoris: Secondary | ICD-10-CM | POA: Insufficient documentation

## 2017-08-25 NOTE — Patient Instructions (Addendum)
Dr Sallyanne Kuster recommends that you schedule a follow-up appointment in 8 months. You will receive a reminder letter in the mail two months in advance. If you don't receive a letter, please call our office to schedule the follow-up appointment.  If you need a refill on your cardiac medications before your next appointment, please call your pharmacy.

## 2017-08-25 NOTE — Progress Notes (Signed)
Cardiology Office Note:    Date:  08/25/2017   ID:  Travis Jenkins, DOB 16-Mar-1951, MRN [id number]  PCP:  Lujean Amel, MD  Cardiologist:  Sanda Klein, MD    Referring MD: Lujean Amel, MD   Chief Complaint  Patient presents with  . Follow-up    3 months  CAD s/p LAD PCI-DES  History of Present Illness:    Travis Jenkins is a 67 y.o. male with a hx of recently diagnosed coronary artery disease presenting with unstable angina in September 2018 and receiving a drug-eluting stent to the proximal-mid LAD. He has not had any chest discomfort since that time and is physically active. He has been engaging in cardiac rehabilitation and is just about to graduate from it. Has had occasional chest discomfort with emotional stress, but this feels different. He does not have any other cardiac arrest or complaints.  The patient specifically denies any chest pain with exertion, dyspnea at rest or with exertion, orthopnea, paroxysmal nocturnal dyspnea, syncope, palpitations, focal neurological deficits, intermittent claudication, lower extremity edema, unexplained weight gain, cough, hemoptysis or wheezing.  Past Medical History:  Diagnosis Date  . Bradycardia 05/01/2017  . Coronary artery disease   . IBS (irritable bowel syndrome)   . Raynaud's disease   . Syncope     Past Surgical History:  Procedure Laterality Date  . ANKLE FRACTURE SURGERY    . ANTERIOR CRUCIATE LIGAMENT REPAIR  1990  . CARDIAC CATHETERIZATION    . CORONARY STENT INTERVENTION N/A 05/01/2017   Procedure: CORONARY STENT INTERVENTION;  Surgeon: Nelva Bush, MD;  Location: [facility];  Service: Cardiovascular;  Laterality: N/A;  . INTRAVASCULAR PRESSURE WIRE/FFR STUDY N/A 05/01/2017   Procedure: INTRAVASCULAR PRESSURE WIRE/FFR STUDY;  Surgeon: Nelva Bush, MD;  Location: [facility];  Service: Cardiovascular;  Laterality: N/A;  . LEFT HEART CATH AND CORONARY ANGIOGRAPHY N/A 05/01/2017   Procedure: LEFT HEART CATH AND CORONARY ANGIOGRAPHY;  Surgeon: Nelva Bush, MD;  Location: [facility];  Service: Cardiovascular;  Laterality: N/A;    Current Medications: Current Meds  Medication Sig  . aspirin EC 81 MG tablet Take 81 mg by mouth daily.  . nitroGLYCERIN (NITROSTAT) 0.4 MG SL tablet Place 1 tablet (0.4 mg total) under the tongue every 5 (five) minutes as needed for chest pain.  . rosuvastatin (CRESTOR) 20 MG tablet Take 1 tablet (20 mg total) by mouth daily.  . ticagrelor (BRILINTA) 90 MG TABS tablet Take 1 tablet (90 mg total) by mouth 2 (two) times daily.  . traZODone (DESYREL) 100 MG tablet Take 50-100 mg by mouth at bedtime.  . [DISCONTINUED] diphenhydrAMINE (BENADRYL) 25 mg capsule Take 1 capsule (25 mg total) by mouth every 6 (six) hours as needed for allergies.     Allergies:   Wasp venom; Erythromycin; and Tetracyclines & related   Social History   Socioeconomic History  . Marital status: Married    Spouse name: None  . Number of children: None  . Years of education: None  . Highest education level: None  Social Needs  . Financial resource strain: None  . Food insecurity - worry: None  . Food insecurity - inability: None  . Transportation needs - medical: None  . Transportation needs - non-medical: None  Occupational History  . None  Tobacco Use  . Smoking status: Former Smoker    Packs/day: 0.50    Years: 12.00    Pack years: 6.00    Types: Cigarettes  Start date: 06/20/1966    Last attempt to quit: 08/19/1971    Years since quitting: 46.0  . Smokeless tobacco: Never Used  Substance and Sexual Activity  . Alcohol use: Yes    Alcohol/week: 2.4 oz    Types: 4 Cans of beer per week  . Drug use: No  . Sexual activity: None  Other Topics Concern  . None  Social History Narrative  . None     Family History: The patient's family history includes Diabetes in his father and mother; Heart disease in his father and mother;  Hyperlipidemia in his father; Parkinson's disease in his mother. ROS:   Please see the history of present illness.     All other systems reviewed and are negative.  EKGs/Labs/Other Studies Reviewed:    The following studies were reviewed today: CT coronary angiography and report of angiogram and perched anus intervention procedure  EKG:  EKG is not ordered today.    Recent Labs: 05/02/2017: ALT 17; BUN 15; Creatinine, Ser 1.05; Hemoglobin 13.3; Magnesium 2.0; Platelets 195; Potassium 3.9; Sodium 137  Recent Lipid Panel    Component Value Date/Time   CHOL 132 05/02/2017 0453   TRIG 108 05/02/2017 0453   HDL 46 05/02/2017 0453   CHOLHDL 2.9 05/02/2017 0453   VLDL 22 05/02/2017 0453   LDLCALC 64 05/02/2017 0453    Physical Exam:    VS:  BP 122/68   Pulse (!) 57   Ht 6\' 1"  (1.854 m)   Wt 212 lb (96.2 kg)   BMI 27.97 kg/m     Wt Readings from Last 3 Encounters:  08/25/17 212 lb (96.2 kg)  07/22/17 212 lb 11.9 oz (96.5 kg)  05/26/17 213 lb 3 oz (96.7 kg)     GEN: Only mildly overweight, fairly fit appearing Well nourished, well developed in no acute distress HEENT: Normal NECK: No JVD; No carotid bruits LYMPHATICS: No lymphadenopathy CARDIAC: RRR, no murmurs, rubs, gallops RESPIRATORY:  Clear to auscultation without rales, wheezing or rhonchi  ABDOMEN: Soft, non-tender, non-distended MUSCULOSKELETAL:  No edema; No deformity  SKIN: Warm and dry NEUROLOGIC:  Alert and oriented x 3 PSYCHIATRIC:  Normal affect   ASSESSMENT:    No diagnosis found. PLAN:    In order of problems listed above:  1. CAD: Asymptomatic since stent placement, discussed need for dual antiplatelet therapy for the next 9 more months. 2. HLP: On high-dose effective statin therapy with LDL cholesterol target range.   Medication Adjustments/Labs and Tests Ordered: Current medicines are reviewed at length with the patient today.  Concerns regarding medicines are outlined above.  No orders of the  defined types were placed in this encounter.  No orders of the defined types were placed in this encounter.   Signed, Sanda Klein, MD  08/25/2017 3:03 PM    [facility]

## 2017-08-26 ENCOUNTER — Encounter (HOSPITAL_COMMUNITY): Payer: BLUE CROSS/BLUE SHIELD

## 2017-08-28 ENCOUNTER — Encounter (HOSPITAL_COMMUNITY): Payer: BLUE CROSS/BLUE SHIELD

## 2017-08-31 ENCOUNTER — Encounter (HOSPITAL_COMMUNITY): Payer: BLUE CROSS/BLUE SHIELD

## 2017-09-02 ENCOUNTER — Encounter (HOSPITAL_COMMUNITY): Payer: BLUE CROSS/BLUE SHIELD

## 2018-01-17 ENCOUNTER — Encounter: Payer: Self-pay | Admitting: Cardiovascular Disease

## 2018-02-11 ENCOUNTER — Other Ambulatory Visit: Payer: Self-pay

## 2018-02-12 ENCOUNTER — Emergency Department (HOSPITAL_COMMUNITY): Payer: BLUE CROSS/BLUE SHIELD

## 2018-02-12 ENCOUNTER — Inpatient Hospital Stay (HOSPITAL_COMMUNITY)
Admission: EM | Admit: 2018-02-12 | Discharge: 2018-02-18 | DRG: 418 | Disposition: A | Discharge status: Home / Self Care | Payer: BLUE CROSS/BLUE SHIELD | Attending: Internal Medicine | Admitting: Internal Medicine

## 2018-02-12 ENCOUNTER — Other Ambulatory Visit: Payer: Self-pay

## 2018-02-12 ENCOUNTER — Encounter (HOSPITAL_COMMUNITY): Payer: Self-pay | Admitting: *Deleted

## 2018-02-12 DIAGNOSIS — Z833 Family history of diabetes mellitus: Secondary | ICD-10-CM

## 2018-02-12 DIAGNOSIS — N182 Chronic kidney disease, stage 2 (mild): Secondary | ICD-10-CM | POA: Diagnosis present

## 2018-02-12 DIAGNOSIS — R0683 Snoring: Secondary | ICD-10-CM | POA: Diagnosis present

## 2018-02-12 DIAGNOSIS — Z955 Presence of coronary angioplasty implant and graft: Secondary | ICD-10-CM | POA: Diagnosis not present

## 2018-02-12 DIAGNOSIS — E7800 Pure hypercholesterolemia, unspecified: Secondary | ICD-10-CM | POA: Diagnosis present

## 2018-02-12 DIAGNOSIS — Z8249 Family history of ischemic heart disease and other diseases of the circulatory system: Secondary | ICD-10-CM | POA: Diagnosis not present

## 2018-02-12 DIAGNOSIS — K589 Irritable bowel syndrome without diarrhea: Secondary | ICD-10-CM

## 2018-02-12 DIAGNOSIS — K660 Peritoneal adhesions (postprocedural) (postinfection): Secondary | ICD-10-CM | POA: Diagnosis present

## 2018-02-12 DIAGNOSIS — K8070 Calculus of gallbladder and bile duct without cholecystitis without obstruction: Principal | ICD-10-CM | POA: Diagnosis present

## 2018-02-12 DIAGNOSIS — R1011 Right upper quadrant pain: Secondary | ICD-10-CM

## 2018-02-12 DIAGNOSIS — Z820 Family history of epilepsy and other diseases of the nervous system: Secondary | ICD-10-CM

## 2018-02-12 DIAGNOSIS — Z91038 Other insect allergy status: Secondary | ICD-10-CM

## 2018-02-12 DIAGNOSIS — R001 Bradycardia, unspecified: Secondary | ICD-10-CM | POA: Diagnosis present

## 2018-02-12 DIAGNOSIS — Z7982 Long term (current) use of aspirin: Secondary | ICD-10-CM

## 2018-02-12 DIAGNOSIS — I214 Non-ST elevation (NSTEMI) myocardial infarction: Secondary | ICD-10-CM | POA: Diagnosis present

## 2018-02-12 DIAGNOSIS — E782 Mixed hyperlipidemia: Secondary | ICD-10-CM

## 2018-02-12 DIAGNOSIS — E785 Hyperlipidemia, unspecified: Secondary | ICD-10-CM | POA: Diagnosis present

## 2018-02-12 DIAGNOSIS — R7989 Other specified abnormal findings of blood chemistry: Secondary | ICD-10-CM

## 2018-02-12 DIAGNOSIS — G4710 Hypersomnia, unspecified: Secondary | ICD-10-CM | POA: Diagnosis present

## 2018-02-12 DIAGNOSIS — N179 Acute kidney failure, unspecified: Secondary | ICD-10-CM | POA: Diagnosis present

## 2018-02-12 DIAGNOSIS — I252 Old myocardial infarction: Secondary | ICD-10-CM

## 2018-02-12 DIAGNOSIS — K8020 Calculus of gallbladder without cholecystitis without obstruction: Secondary | ICD-10-CM | POA: Diagnosis not present

## 2018-02-12 DIAGNOSIS — R740 Nonspecific elevation of levels of transaminase and lactic acid dehydrogenase [LDH]: Secondary | ICD-10-CM | POA: Diagnosis present

## 2018-02-12 DIAGNOSIS — R739 Hyperglycemia, unspecified: Secondary | ICD-10-CM | POA: Diagnosis present

## 2018-02-12 DIAGNOSIS — I2510 Atherosclerotic heart disease of native coronary artery without angina pectoris: Secondary | ICD-10-CM | POA: Diagnosis present

## 2018-02-12 DIAGNOSIS — Z8349 Family history of other endocrine, nutritional and metabolic diseases: Secondary | ICD-10-CM | POA: Diagnosis not present

## 2018-02-12 DIAGNOSIS — Z79899 Other long term (current) drug therapy: Secondary | ICD-10-CM

## 2018-02-12 DIAGNOSIS — Z881 Allergy status to other antibiotic agents status: Secondary | ICD-10-CM

## 2018-02-12 DIAGNOSIS — Z87891 Personal history of nicotine dependence: Secondary | ICD-10-CM

## 2018-02-12 DIAGNOSIS — Z7902 Long term (current) use of antithrombotics/antiplatelets: Secondary | ICD-10-CM

## 2018-02-12 DIAGNOSIS — R109 Unspecified abdominal pain: Secondary | ICD-10-CM | POA: Diagnosis present

## 2018-02-12 DIAGNOSIS — R7309 Other abnormal glucose: Secondary | ICD-10-CM

## 2018-02-12 DIAGNOSIS — E860 Dehydration: Secondary | ICD-10-CM | POA: Diagnosis present

## 2018-02-12 DIAGNOSIS — K580 Irritable bowel syndrome with diarrhea: Secondary | ICD-10-CM | POA: Diagnosis present

## 2018-02-12 DIAGNOSIS — Z01810 Encounter for preprocedural cardiovascular examination: Secondary | ICD-10-CM

## 2018-02-12 DIAGNOSIS — I7300 Raynaud's syndrome without gangrene: Secondary | ICD-10-CM | POA: Diagnosis present

## 2018-02-12 HISTORY — DX: Atherosclerotic heart disease of native coronary artery without angina pectoris: I25.10

## 2018-02-12 LAB — LIPID PANEL
Cholesterol: 107 mg/dL (ref 0–200)
HDL: 40 mg/dL — ABNORMAL LOW (ref >40)
LDL Cholesterol: 40 mg/dL (ref 0–99)
Total CHOL/HDL Ratio: 2.7 RATIO
Triglycerides: 135 mg/dL (ref <150)
VLDL: 27 mg/dL (ref 0–40)

## 2018-02-12 LAB — URINALYSIS, ROUTINE W REFLEX MICROSCOPIC
Bilirubin Urine: NEGATIVE
Glucose, UA: NEGATIVE mg/dL
Hgb urine dipstick: NEGATIVE
Ketones, ur: NEGATIVE mg/dL
Leukocytes, UA: NEGATIVE
Nitrite: NEGATIVE
Protein, ur: NEGATIVE mg/dL
Specific Gravity, Urine: 1.008 (ref 1.005–1.030)
pH: 8 (ref 5.0–8.0)

## 2018-02-12 LAB — CBC
HCT: 45.2 % (ref 39.0–52.0)
Hemoglobin: 15.2 g/dL (ref 13.0–17.0)
MCH: 31 pg (ref 26.0–34.0)
MCHC: 33.6 g/dL (ref 30.0–36.0)
MCV: 92.2 fL (ref 78.0–100.0)
Platelets: 168 10*3/uL (ref 150–400)
RBC: 4.9 MIL/uL (ref 4.22–5.81)
RDW: 12 % (ref 11.5–15.5)
WBC: 9.7 10*3/uL (ref 4.0–10.5)

## 2018-02-12 LAB — COMPREHENSIVE METABOLIC PANEL
ALT: 165 U/L — ABNORMAL HIGH (ref 0–44)
AST: 306 U/L — ABNORMAL HIGH (ref 15–41)
Albumin: 4.4 g/dL (ref 3.5–5.0)
Alkaline Phosphatase: 77 U/L (ref 38–126)
Anion gap: 9 (ref 5–15)
BUN: 10 mg/dL (ref 8–23)
CO2: 29 mmol/L (ref 22–32)
Calcium: 9.8 mg/dL (ref 8.9–10.3)
Chloride: 102 mmol/L (ref 98–111)
Creatinine, Ser: 1.33 mg/dL — ABNORMAL HIGH (ref 0.61–1.24)
GFR calc Af Amer: >60 mL/min (ref >60)
GFR calc non Af Amer: 55 mL/min — ABNORMAL LOW (ref >60)
Glucose, Bld: 146 mg/dL — ABNORMAL HIGH (ref 70–99)
Potassium: 4.1 mmol/L (ref 3.5–5.1)
Sodium: 140 mmol/L (ref 135–145)
Total Bilirubin: 1 mg/dL (ref 0.3–1.2)
Total Protein: 6.8 g/dL (ref 6.5–8.1)

## 2018-02-12 LAB — GLUCOSE, CAPILLARY
Glucose-Capillary: 77 mg/dL (ref 70–99)
Glucose-Capillary: 141 mg/dL — ABNORMAL HIGH (ref 70–99)

## 2018-02-12 LAB — HEMOGLOBIN A1C
Hgb A1c MFr Bld: 5.8 % — ABNORMAL HIGH (ref 4.8–5.6)
Mean Plasma Glucose: 119.76 mg/dL

## 2018-02-12 LAB — I-STAT TROPONIN, ED: Troponin i, poc: 0 ng/mL (ref 0.00–0.08)

## 2018-02-12 LAB — ACETAMINOPHEN LEVEL: Acetaminophen (Tylenol), Serum: <10 ug/mL — ABNORMAL LOW (ref 10–30)

## 2018-02-12 LAB — LIPASE, BLOOD: Lipase: 36 U/L (ref 11–51)

## 2018-02-12 MED ORDER — IOHEXOL 300 MG/ML  SOLN: 100.0000 mL | Freq: Once | INTRAMUSCULAR | Status: DC

## 2018-02-12 MED ORDER — ACETAMINOPHEN 650 MG RE SUPP: 650.0000 mg | Freq: Four times a day (QID) | RECTAL | Status: DC | PRN

## 2018-02-12 MED ORDER — SENNOSIDES-DOCUSATE SODIUM 8.6-50 MG PO TABS: 1.0000 | ORAL_TABLET | Freq: Every evening | ORAL | Status: DC | PRN

## 2018-02-12 MED ORDER — ONDANSETRON HCL 4 MG/2ML IJ SOLN
4.0000 mg | Freq: Once | INTRAMUSCULAR | Status: AC
  Administered 2018-02-12: 4 mg via INTRAVENOUS
  Filled 2018-02-12: qty 2

## 2018-02-12 MED ORDER — MORPHINE SULFATE (PF) 4 MG/ML IV SOLN
4.0000 mg | INTRAVENOUS | Status: DC | PRN
  Administered 2018-02-17: 4 mg via INTRAVENOUS
  Filled 2018-02-12: qty 1

## 2018-02-12 MED ORDER — DICYCLOMINE HCL 10 MG/ML IM SOLN
20.0000 mg | Freq: Once | INTRAMUSCULAR | Status: DC
Start: 2018-02-12 — End: 2018-02-18

## 2018-02-12 MED ORDER — MORPHINE SULFATE (PF) 4 MG/ML IV SOLN
4.0000 mg | Freq: Once | INTRAVENOUS | Status: AC
  Administered 2018-02-12: 4 mg via INTRAVENOUS
  Filled 2018-02-12: qty 1

## 2018-02-12 MED ORDER — SODIUM CHLORIDE 0.9 % IV BOLUS
1000.0000 mL | Freq: Once | INTRAVENOUS | Status: AC
  Administered 2018-02-12: 1000 mL via INTRAVENOUS

## 2018-02-12 MED ORDER — BISACODYL 10 MG RE SUPP: 10.0000 mg | Freq: Every day | RECTAL | Status: DC | PRN

## 2018-02-12 MED ORDER — MORPHINE SULFATE (PF) 4 MG/ML IV SOLN: 1.0000 mg | INTRAVENOUS | Status: DC | PRN

## 2018-02-12 MED ORDER — ONDANSETRON HCL 4 MG/2ML IJ SOLN
4.0000 mg | Freq: Four times a day (QID) | INTRAMUSCULAR | Status: DC | PRN
Start: 2018-02-12 — End: 2018-02-18

## 2018-02-12 MED ORDER — ACETAMINOPHEN 325 MG PO TABS: 650.0000 mg | ORAL_TABLET | Freq: Four times a day (QID) | ORAL | Status: DC | PRN

## 2018-02-12 MED ORDER — SODIUM CHLORIDE 0.9 % IV SOLN
INTRAVENOUS | Status: DC
  Administered 2018-02-12 (×2): 1 mL via INTRAVENOUS
  Administered 2018-02-13: 06:00:00 via INTRAVENOUS

## 2018-02-12 MED ORDER — CEFTRIAXONE SODIUM 2 G IJ SOLR
2.0000 g | Freq: Once | INTRAMUSCULAR | Status: AC
  Administered 2018-02-12: 2 g via INTRAVENOUS
  Filled 2018-02-12: qty 20

## 2018-02-12 MED ORDER — ZOLPIDEM TARTRATE 5 MG PO TABS
5.0000 mg | ORAL_TABLET | Freq: Every evening | ORAL | Status: DC | PRN
  Administered 2018-02-12 – 2018-02-17 (×6): 5 mg via ORAL
  Filled 2018-02-12 (×6): qty 1

## 2018-02-12 MED ORDER — ROSUVASTATIN CALCIUM 20 MG PO TABS
20.0000 mg | ORAL_TABLET | Freq: Every day | ORAL | Status: DC
  Administered 2018-02-12 – 2018-02-18 (×6): 20 mg via ORAL
  Filled 2018-02-12 (×7): qty 1

## 2018-02-12 MED ORDER — HYDROCODONE-ACETAMINOPHEN 5-325 MG PO TABS
1.0000 | ORAL_TABLET | ORAL | Status: DC | PRN
  Administered 2018-02-17 – 2018-02-18 (×4): 2 via ORAL
  Filled 2018-02-12 (×4): qty 2

## 2018-02-12 MED ORDER — SODIUM CHLORIDE 0.9 % IV SOLN
2.0000 g | INTRAVENOUS | Status: DC
  Administered 2018-02-12 – 2018-02-17 (×6): 2 g via INTRAVENOUS
  Filled 2018-02-12 (×7): qty 20

## 2018-02-12 NOTE — ED Notes (Signed)
Attempted report 

## 2018-02-12 NOTE — Consult Note (Signed)
Reason for Consult:Abdoominal pain and gallstones Referring Physician: Rancour  Travis Jenkins is an 67 y.o. male.  HPI: About one week of abdominal discomfort, worsened after eating [place] food meal about 6 PM.  Came to the ED about 2AM, US shows gallstones but not acute cholecystitis.  Likely episode of biliary colin.  AST/ALT elevated  Past Medical History:  Diagnosis Date  . Bradycardia 05/01/2017  . Coronary artery disease   . IBS (irritable bowel syndrome)   . Raynaud's disease   . Syncope     Past Surgical History:  Procedure Laterality Date  . ANKLE FRACTURE SURGERY    . ANTERIOR CRUCIATE LIGAMENT REPAIR  1990  . CARDIAC CATHETERIZATION    . CORONARY STENT INTERVENTION N/A 05/01/2017   Procedure: CORONARY STENT INTERVENTION;  Surgeon: Nelva Bush, MD;  Location: [facility];  Service: Cardiovascular;  Laterality: N/A;  . INTRAVASCULAR PRESSURE WIRE/FFR STUDY N/A 05/01/2017   Procedure: INTRAVASCULAR PRESSURE WIRE/FFR STUDY;  Surgeon: Nelva Bush, MD;  Location: [facility];  Service: Cardiovascular;  Laterality: N/A;  . LEFT HEART CATH AND CORONARY ANGIOGRAPHY N/A 05/01/2017   Procedure: LEFT HEART CATH AND CORONARY ANGIOGRAPHY;  Surgeon: Nelva Bush, MD;  Location: [facility];  Service: Cardiovascular;  Laterality: N/A;    Family History  Problem Relation Age of Onset  . Hyperlipidemia Father   . Diabetes Father   . Heart disease Father   . Diabetes Mother   . Parkinson's disease Mother   . Heart disease Mother     Social History:  reports that he quit smoking about 46 years ago. His smoking use included cigarettes. He started smoking about 51 years ago. He has a 6.00 pack-year smoking history. He has never used smokeless tobacco. He reports that he drinks about 2.4 oz of alcohol per week. He reports that he does not use drugs.  Allergies:  Allergies  Allergen Reactions  . Wasp Venom Anaphylaxis    In Sept 2018, stung by  multiple yellow jackets  . Erythromycin Rash  . Tetracyclines & Related Rash    Medications: I have reviewed the patient's current medications.  Results for orders placed or performed during the hospital encounter of 02/12/18 (from the past 48 hour(s))  Lipase, blood     Status: None   Collection Time: 02/12/18  3:06 AM  Result Value Ref Range   Lipase 36 11 - 51 U/L    Comment: Performed at [facility], [place] [address], [place], [place] [address]  Comprehensive metabolic panel     Status: Abnormal   Collection Time: 02/12/18  3:06 AM  Result Value Ref Range   Sodium 140 135 - 145 mmol/L   Potassium 4.1 3.5 - 5.1 mmol/L   Chloride 102 98 - 111 mmol/L    Comment: Please note change in reference range.   CO2 29 22 - 32 mmol/L   Glucose, Bld 146 (H) 70 - 99 mg/dL    Comment: Please note change in reference range.   BUN 10 8 - 23 mg/dL    Comment: Please note change in reference range.   Creatinine, Ser 1.33 (H) 0.61 - 1.24 mg/dL   Calcium 9.8 8.9 - 10.3 mg/dL   Total Protein 6.8 6.5 - 8.1 g/dL   Albumin 4.4 3.5 - 5.0 g/dL   AST 306 (H) 15 - 41 U/L   ALT 165 (H) 0 - 44 U/L    Comment: Please note change in reference range.  Alkaline Phosphatase 77 38 - 126 U/L   Total Bilirubin 1.0 0.3 - 1.2 mg/dL   GFR calc non Af Amer 55 (L) >60 mL/min   GFR calc Af Amer >60 >60 mL/min    Comment: (NOTE) The eGFR has been calculated using the CKD EPI equation. This calculation has not been validated in all clinical situations. eGFR's persistently <60 mL/min signify possible Chronic Kidney Disease.    Anion gap 9 5 - 15    Comment: Performed at [facility] [address] [place] [address]  CBC     Status: None   Collection Time: 02/12/18  3:06 AM  Result Value Ref Range   WBC 9.7 4.0 - 10.5 K/uL   RBC 4.90 4.22 - 5.81 MIL/uL   Hemoglobin 15.2 13.0 - 17.0 g/dL   HCT 45.2 39.0 - 52.0 %   MCV 92.2 78.0 - 100.0 fL   MCH 31.0 26.0 - 34.0 pg   MCHC 33.6 30.0 - 36.0  g/dL   RDW 12.0 11.5 - 15.5 %   Platelets 168 150 - 400 K/uL    Comment: Performed at [facility], [place] [address] [place], [place] [address]  Urinalysis, Routine w reflex microscopic     Status: Abnormal   Collection Time: 02/12/18  3:10 AM  Result Value Ref Range   Color, Urine YELLOW YELLOW   APPearance CLOUDY (A) CLEAR   Specific Gravity, Urine 1.008 1.005 - 1.030   pH 8.0 5.0 - 8.0   Glucose, UA NEGATIVE NEGATIVE mg/dL   Hgb urine dipstick NEGATIVE NEGATIVE   Bilirubin Urine NEGATIVE NEGATIVE   Ketones, ur NEGATIVE NEGATIVE mg/dL   Protein, ur NEGATIVE NEGATIVE mg/dL   Nitrite NEGATIVE NEGATIVE   Leukocytes, UA NEGATIVE NEGATIVE    Comment: Performed at [facility] [address] [place], [place] [address]  I-Stat Troponin, ED (not at [facility])     Status: None   Collection Time: 02/12/18  3:13 AM  Result Value Ref Range   Troponin i, poc 0.00 0.00 - 0.08 ng/mL   Comment 3            Comment: Due to the release kinetics of cTnI, a negative result within the first hours of the onset of symptoms does not rule out myocardial infarction with certainty. If myocardial infarction is still suspected, repeat the test at appropriate intervals.   Acetaminophen level     Status: Abnormal   Collection Time: 02/12/18  4:04 AM  Result Value Ref Range   Acetaminophen (Tylenol), Serum <10 (L) 10 - 30 ug/mL    Comment: (NOTE) Therapeutic concentrations vary significantly. A range of 10-30 ug/mL  may be an effective concentration for many patients. However, some  are best treated at concentrations outside of this range. Acetaminophen concentrations >150 ug/mL at 4 hours after ingestion  and >50 ug/mL at 12 hours after ingestion are often associated with  toxic reactions. Performed at [facility], [place] [address], [place], [place] [address]     US Abdomen Limited Ruq  Result Date: 02/12/2018 CLINICAL DATA:  67 y/o  M; right upper quadrant pain. EXAM: ULTRASOUND  ABDOMEN LIMITED RIGHT UPPER QUADRANT COMPARISON:  None. FINDINGS: Gallbladder: Large gallstone measuring up to 3.7 cm and gallbladder sludge. No gallbladder wall thickening or pericholecystic fluid. Negative sonographic Murphy's sign. Common bile duct: Diameter: 4.5 mm Liver: No focal lesion identified. Within normal limits in parenchymal echogenicity. Portal vein is patent on color  Doppler imaging with normal direction of blood flow towards the liver. IMPRESSION: Cholelithiasis.  No secondary signs of acute cholecystitis. Electronically Signed   By: Kristine Garbe M.D.   On: 02/12/2018 05:29    Review of Systems  Constitutional: Negative for chills and fever.  HENT: Negative.   Eyes: Negative.   Respiratory: Negative.   Cardiovascular: Negative.   Gastrointestinal: Positive for abdominal pain and nausea. Negative for vomiting.  Genitourinary: Negative.   Musculoskeletal: Negative.   Neurological: Negative.   Endo/Heme/Allergies: Negative.   Psychiatric/Behavioral: Negative.    Blood pressure (!) 127/59, pulse (!) 59, temperature 97.7 F (36.5 C), temperature source Oral, resp. rate 17, height _0  (1.854 m), weight 95.3 kg (210 lb), SpO2 94 %. Physical Exam  Constitutional: He is oriented to person, place, and time. He appears well-developed and well-nourished.  HENT:  Head: Normocephalic and atraumatic.  Nose: Nose normal.  Mouth/Throat: Oropharynx is clear and moist.  Eyes: Pupils are equal, round, and reactive to light. Conjunctivae and EOM are normal.  Neck: Normal range of motion. Neck supple.  Cardiovascular: Normal rate, regular rhythm and normal heart sounds.  Respiratory: Effort normal and breath sounds normal.  GI: Soft. Bowel sounds are normal. There is tenderness in the right upper quadrant and epigastric area. There is no rigidity, no rebound and no guarding.  Musculoskeletal: Normal range of motion.  Neurological: He is alert and oriented to person, place, and  time. He has normal reflexes.  Skin: Skin is warm and dry.  Psychiatric: He has a normal mood and affect. His behavior is normal. Judgment and thought content normal.    Assessment/Plan: Likely biliary colic with cholelithiasis.  Antiplatelet medication with Brilinta Needs to be off 24 hours prior to surgery, and we need a Cardiology consultation Needs laparoscopic cholecystectomy  Judeth Horn 02/12/2018, 6:47 AM

## 2018-02-12 NOTE — Consult Note (Signed)
Cardiology Consultation:   Patient ID: Travis Jenkins; [id number]; 1951-05-26   Admit date: 02/12/2018 Date of Consult: 02/12/2018  Primary Care Provider: Lujean Amel, MD Primary Cardiologist: Sanda Klein, MD  Primary Electrophysiologist:     Patient Profile:   Travis Jenkins is a 67 y.o. male with a hx of CAD, s/p stenting of prox - mid LAD in Sept. 2018  who is being seen today for the evaluation of pre-op evaluation for Cholecystectomy  at the request of  Dr. Herbert Moors and Dr. Hulen Skains.      History of Present Illness:   Travis Jenkins was last seen by Dr. Sallyanne Kuster in January .  He has a history of hyperlipidemia and coronary artery disease.  He is done very well.  Is not had any further episodes of angina..  The patient was admitted with right upper quadrant pain shortly after eating at a [employer].  Pain radiated to the shoulder blade.  He did not have any nausea vomiting or diarrhea.  Patient was on Brilinta which is currently on hold.  He is feeling much better this morning.  He is not having any episodes of abdominal pain.  He is afebrile and his white count is 9.7.  He has not had any episodes of significant angina.  It is been 9 months since his stent procedure.  Past Medical History:  Diagnosis Date  . Bradycardia 05/01/2017  . CAD (coronary artery disease)   . Coronary artery disease   . IBS (irritable bowel syndrome)   . Raynaud's disease   . Syncope     Past Surgical History:  Procedure Laterality Date  . ANKLE FRACTURE SURGERY    . ANTERIOR CRUCIATE LIGAMENT REPAIR  1990  . CARDIAC CATHETERIZATION    . CORONARY STENT INTERVENTION N/A 05/01/2017   Procedure: CORONARY STENT INTERVENTION;  Surgeon: Nelva Bush, MD;  Location: [facility] [place];  Service: Cardiovascular;  Laterality: N/A;  . INTRAVASCULAR PRESSURE WIRE/FFR STUDY N/A 05/01/2017   Procedure: INTRAVASCULAR PRESSURE WIRE/FFR STUDY;  Surgeon: Nelva Bush, MD;  Location: [facility];  Service: Cardiovascular;  Laterality: N/A;  . LEFT HEART CATH AND CORONARY ANGIOGRAPHY N/A 05/01/2017   Procedure: LEFT HEART CATH AND CORONARY ANGIOGRAPHY;  Surgeon: Nelva Bush, MD;  Location: [place] [facility];  Service: Cardiovascular;  Laterality: N/A;     Home Medications:  Prior to Admission medications   Medication Sig Start Date End Date Taking? Authorizing Provider  acetaminophen (TYLENOL) 325 MG tablet Take 325-650 mg by mouth every 6 (six) hours as needed (for pain or headaches).   Yes [provider]  aspirin EC 81 MG tablet Take 81 mg by mouth daily.   Yes [provider]  nitroGLYCERIN (NITROSTAT) 0.4 MG SL tablet Place 1 tablet (0.4 mg total) under the tongue every 5 (five) minutes as needed for chest pain. 05/02/17  Yes Sheikh, Omair Latif, DO  rosuvastatin (CRESTOR) 20 MG tablet Take 1 tablet (20 mg total) by mouth daily. 05/15/17  Yes Almyra Deforest, PA  ticagrelor (BRILINTA) 90 MG TABS tablet Take 1 tablet (90 mg total) by mouth 2 (two) times daily. 05/15/17  Yes Almyra Deforest, PA    Inpatient Medications: Scheduled Meds: . dicyclomine  20 mg Intramuscular Once  . iohexol  100 mL Intravenous Once  . rosuvastatin  20 mg Oral Daily   Continuous Infusions: . sodium chloride 1 mL (02/12/18 0947)  . [START ON 02/13/2018] cefTRIAXone (ROCEPHIN)  IV  PRN Meds: acetaminophen **OR** acetaminophen, bisacodyl, HYDROcodone-acetaminophen, morphine injection, ondansetron, senna-docusate  Allergies:    Allergies  Allergen Reactions  . 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 Erythromycin Rash  . Tetracyclines & Related Rash    Social History:   Social History   Socioeconomic History  . Marital status: Married    Spouse name: Not on file  . Number of children: Not on file  . Years of education: Not on file  . Highest education level: Not on file  Occupational History  . Not on file  Social Needs  .  Financial resource strain: Not on file  . Food insecurity:    Worry: Not on file    Inability: Not on file  . Transportation needs:    Medical: Not on file    Non-medical: Not on file  Tobacco Use  . Smoking status: Former Smoker    Packs/day: 0.50    Years: 12.00    Pack years: 6.00    Types: Cigarettes    Start date: 06/20/1966    Last attempt to quit: 08/19/1971    Years since quitting: 46.5  . Smokeless tobacco: Never Used  Substance and Sexual Activity  . Alcohol use: Yes    Alcohol/week: 2.4 oz    Types: 4 Cans of beer per week  . Drug use: No  . Sexual activity: Not on file  Lifestyle  . Physical activity:    Days per week: Not on file    Minutes per session: Not on file  . Stress: Not on file  Relationships  . Social connections:    Talks on phone: Not on file    Gets together: Not on file    Attends religious service: Not on file    Active member of club or organization: Not on file    Attends meetings of clubs or organizations: Not on file    Relationship status: Not on file  . Intimate partner violence:    Fear of current or ex partner: Not on file    Emotionally abused: Not on file    Physically abused: Not on file    Forced sexual activity: Not on file  Other Topics Concern  . Not on file  Social History Narrative  . Not on file    Family History:    Family History  Problem Relation Age of Onset  . Hyperlipidemia Father   . Diabetes Father   . Heart disease Father   . Diabetes Mother   . Parkinson's disease Mother   . Heart disease Mother      ROS:  Please see the history of present illness.   All other ROS reviewed and negative.     Physical Exam/Data:   Vitals:   02/12/18 0615 02/12/18 0713 02/12/18 0743 02/12/18 0747  BP:  133/80 127/73   Pulse: (!) 59 (!) 56 (!) 45   Resp:  10 16   Temp:   97.8 F (36.6 C)   TempSrc:   Oral   SpO2: 94% 97% 95%   Weight:    214 lb 8.1 oz (97.3 kg)  Height:    6\' 1"  (1.854 m)    Intake/Output  Summary (Last 24 hours) at 02/12/2018 0957 Last data filed at 02/12/2018 0614 Gross per 24 hour  Intake 1000 ml  Output -  Net 1000 ml   Filed Weights   02/12/18 0301 02/12/18 0747  Weight: 210 lb (95.3  kg) 214 lb 8.1 oz (97.3 kg)   Body mass index is 28.3 kg/m.  General:  Well nourished, well developed, in no acute distress  HEENT: normal Lymph: no adenopathy Neck: no JVD Endocrine:  No thryomegaly Vascular: No carotid bruits; FA pulses 2+ bilaterally without bruits  Cardiac:  normal S1, S2; RRR; no murmur Lungs:  clear to auscultation bilaterally, no wheezing, rhonchi or rales  Abd: soft, nontender, no hepatomegaly  Ext: no edema Musculoskeletal:  No deformities, BUE and BLE strength normal and equal Skin: warm and dry  Neuro:  CNs 2-12 intact, no focal abnormalities noted Psych:  Normal affect   EKG:   Sinus bradycardia 51 beats a minute.  No ST or T wave changes. Telemetry:  Telemetry was personally reviewed and demonstrates:  Sinus brady   Relevant CV Studies:   Laboratory Data:  Chemistry Recent Labs  Lab 02/12/18 0306  NA 140  K 4.1  CL 102  CO2 29  GLUCOSE 146*  BUN 10  CREATININE 1.33*  CALCIUM 9.8  GFRNONAA 55*  GFRAA >60  ANIONGAP 9    Recent Labs  Lab 02/12/18 0306  PROT 6.8  ALBUMIN 4.4  AST 306*  ALT 165*  ALKPHOS 77  BILITOT 1.0   Hematology Recent Labs  Lab 02/12/18 0306  WBC 9.7  RBC 4.90  HGB 15.2  HCT 45.2  MCV 92.2  MCH 31.0  MCHC 33.6  RDW 12.0  PLT 168   Cardiac EnzymesNo results for input(s): TROPONINI in the last 168 hours.  Recent Labs  Lab 02/12/18 0313  TROPIPOC 0.00    BNPNo results for input(s): BNP, PROBNP in the last 168 hours.  DDimer No results for input(s): DDIMER in the last 168 hours.  Radiology/Studies:  US Abdomen Limited Ruq  Result Date: 02/12/2018 CLINICAL DATA:  67 y/o  M; right upper quadrant pain. EXAM: ULTRASOUND ABDOMEN LIMITED RIGHT UPPER QUADRANT COMPARISON:  None. FINDINGS:  Gallbladder: Large gallstone measuring up to 3.7 cm and gallbladder sludge. No gallbladder wall thickening or pericholecystic fluid. Negative sonographic Murphy's sign. Common bile duct: Diameter: 4.5 mm Liver: No focal lesion identified. Within normal limits in parenchymal echogenicity. Portal vein is patent on color Doppler imaging with normal direction of blood flow towards the liver. IMPRESSION: Cholelithiasis.  No secondary signs of acute cholecystitis. Electronically Signed   By: Kristine Garbe M.D.   On: 02/12/2018 05:29    Assessment and Plan:   1. Preoperative evaluation prior to cholecystectomy:  The patient has a history of coronary artery disease and is status post stenting of his LAD 9 months ago.  He is been on aspirin and Brilinta.  He took his last dose of Brilinta last night and it is currently on hold.  He is not acutely ill at this time.  I recommendation would be to hold Brilinta for 5 days prior to having surgery.  This will minimize risk of bleeding.   We typically recommend dual antiplatelet therapy for a total of 12 months following stent procedure but there is significant data suggesting that holding Brilinta after the six-month mark is acceptable in the setting of urgent surgery.       I would recommend continuing aspirin if possible.  This will help minimize the risk of subacute stent thrombosis.  I would recommend restarting the Brilinta as soon as possible after the surgery.  We could consider checking a P2 Y 12 level around Sunday or Monday to see if it is normalized.  If  the P2 Y 12 level is 240 or greater on Monday then we could consider doing surgery at that time.    2.  Hyperlipidemia: Recent lipid levels look great.   For questions or updates, please contact [place] Please consult [email or web address] for contact info under Cardiology/STEMI.   Signed, Mertie Moores, MD  02/12/2018 9:57 AM

## 2018-02-12 NOTE — ED Provider Notes (Signed)
[facility] EMERGENCY DEPARTMENT Provider Note   CSN: [id number] Arrival date & time: 02/12/18  0251     History   Chief Complaint Chief Complaint  Patient presents with  . Abdominal Pain    HPI Travis Jenkins is a 67 y.o. male.  Patient with history of CAD status post stent, bradycardia, self diagnosed IBS presenting with "stomach bug" for the past 1 week.  Reports pain in his upper abdomen that comes and goes.  He has not noticed any association with food.  Or diarrhea.  No fever.  No pain with urination or blood in the urine.  He has had nausea but no vomiting.  He is awoken from sleep today with a severe episode of this pain.  He has not had any vomiting pain is not similar to his previous cardiac pain.  Still has a gallbladder and appendix in place.  Pain is in his right upper abdomen but spreads diffusely and radiates to his right back.  The history is provided by the patient.  Abdominal Pain   Associated symptoms include nausea. Pertinent negatives include fever, diarrhea, vomiting, constipation, dysuria, headaches, arthralgias and myalgias.    Past Medical History:  Diagnosis Date  . Bradycardia 05/01/2017  . Coronary artery disease   . IBS (irritable bowel syndrome)   . Raynaud's disease   . Syncope     Patient Active Problem List   Diagnosis Date Noted  . CAD (coronary artery disease) 08/25/2017  . NSTEMI (non-ST elevated myocardial infarction) ([place])   . Elevated troponin 04/30/2017  . Leukocytosis 04/30/2017  . Hypophosphatemia 04/30/2017  . CKD (chronic kidney disease) stage 2, GFR 60-89 ml/min 04/30/2017  . Hyperlipidemia 04/30/2017  . Anaphylaxis 04/29/2017  . Chest pain   . Syncope 02/15/2014  . Bradycardia 02/15/2014    Past Surgical History:  Procedure Laterality Date  . ANKLE FRACTURE SURGERY    . ANTERIOR CRUCIATE LIGAMENT REPAIR  1990  . CARDIAC CATHETERIZATION    . CORONARY STENT INTERVENTION N/A 05/01/2017   Procedure:  CORONARY STENT INTERVENTION;  Surgeon: Nelva Bush, MD;  Location: [place] [facility];  Service: Cardiovascular;  Laterality: N/A;  . INTRAVASCULAR PRESSURE WIRE/FFR STUDY N/A 05/01/2017   Procedure: INTRAVASCULAR PRESSURE WIRE/FFR STUDY;  Surgeon: Nelva Bush, MD;  Location: [facility];  Service: Cardiovascular;  Laterality: N/A;  . LEFT HEART CATH AND CORONARY ANGIOGRAPHY N/A 05/01/2017   Procedure: LEFT HEART CATH AND CORONARY ANGIOGRAPHY;  Surgeon: Nelva Bush, MD;  Location: [place];  Service: Cardiovascular;  Laterality: N/A;        Home Medications    Prior to Admission medications   Medication Sig Start Date End Date Taking? Authorizing Provider  aspirin EC 81 MG tablet Take 81 mg by mouth daily.    [provider]  nitroGLYCERIN (NITROSTAT) 0.4 MG SL tablet Place 1 tablet (0.4 mg total) under the tongue every 5 (five) minutes as needed for chest pain. 05/02/17   Raiford Noble Latif, DO  rosuvastatin (CRESTOR) 20 MG tablet Take 1 tablet (20 mg total) by mouth daily. 05/15/17   Almyra Deforest, PA  ticagrelor (BRILINTA) 90 MG TABS tablet Take 1 tablet (90 mg total) by mouth 2 (two) times daily. 05/15/17   Almyra Deforest, PA  traZODone (DESYREL) 100 MG tablet Take 50-100 mg by mouth at bedtime. 04/10/17   [provider]    Family History Family History  Problem Relation Age of Onset  . Hyperlipidemia Father   .  Diabetes Father   . Heart disease Father   . Diabetes Mother   . Parkinson's disease Mother   . Heart disease Mother     Social History Social History   Tobacco Use  . Smoking status: Former Smoker    Packs/day: 0.50    Years: 12.00    Pack years: 6.00    Types: Cigarettes    Start date: 06/20/1966    Last attempt to quit: 08/19/1971    Years since quitting: 46.5  . Smokeless tobacco: Never Used  Substance Use Topics  . Alcohol use: Yes    Alcohol/week: 2.4 oz    Types: 4 Cans of beer per week  . Drug use: No      Allergies   Wasp venom; Erythromycin; and Tetracyclines & related   Review of Systems Review of Systems  Constitutional: Positive for activity change and appetite change. Negative for fever.  HENT: Negative for congestion and sore throat.   Eyes: Negative for visual disturbance.  Respiratory: Negative for cough, chest tightness and shortness of breath.   Gastrointestinal: Positive for abdominal pain and nausea. Negative for constipation, diarrhea and vomiting.  Genitourinary: Negative for dysuria and testicular pain.  Musculoskeletal: Positive for back pain. Negative for arthralgias, gait problem and myalgias.  Skin: Negative for rash.  Neurological: Negative for dizziness and headaches.   all other systems are negative except as noted in the HPI and PMH.     Physical Exam Updated Vital Signs BP (!) 162/87   Pulse (!) 47   Temp 97.7 F (36.5 C) (Oral)   Resp 20   Ht 6\' 1"  (1.854 m)   Wt 95.3 kg (210 lb)   SpO2 100%   BMI 27.71 kg/m   Physical Exam  Constitutional: He is oriented to person, place, and time. He appears well-developed and well-nourished. No distress.  Ill appearing  HENT:  Head: Normocephalic and atraumatic.  Mouth/Throat: Oropharynx is clear and moist. No oropharyngeal exudate.  Eyes: Pupils are equal, round, and reactive to light. Conjunctivae and EOM are normal.  Neck: Normal range of motion. Neck supple.  No meningismus.  Cardiovascular: Normal rate, normal heart sounds and intact distal pulses.  No murmur heard. Sinus bradycardia  Pulmonary/Chest: Effort normal and breath sounds normal. No respiratory distress.  Abdominal: Soft. There is tenderness. There is no rebound and no guarding.  Mild diffuse tenderness, abdomen is soft, tenderness is worse in the right upper quadrant and epigastrium.  Voluntary guarding present  Musculoskeletal: Normal range of motion. He exhibits no edema or tenderness.  No CVA tenderness  Neurological: He is alert  and oriented to person, place, and time. No cranial nerve deficit. He exhibits normal muscle tone. Coordination normal.  No ataxia on finger to nose bilaterally. No pronator drift. 5/5 strength throughout. CN 2-12 intact.Equal grip strength. Sensation intact.   Skin: Skin is warm. Capillary refill takes less than 2 seconds.  Psychiatric: He has a normal mood and affect. His behavior is normal.  Nursing note and vitals reviewed.    ED Treatments / Results  Labs (all labs ordered are listed, but only abnormal results are displayed) Labs Reviewed  COMPREHENSIVE METABOLIC PANEL - Abnormal; Notable for the following components:      Result Value   Glucose, Bld 146 (*)    Creatinine, Ser 1.33 (*)    AST 306 (*)    ALT 165 (*)    GFR calc non Af Amer 55 (*)    All other components  within normal limits  URINALYSIS, ROUTINE W REFLEX MICROSCOPIC - Abnormal; Notable for the following components:   APPearance CLOUDY (*)    All other components within normal limits  LIPASE, BLOOD  CBC  HEPATITIS PANEL, ACUTE  ACETAMINOPHEN LEVEL  I-STAT TROPONIN, ED    EKG EKG Interpretation  Date/Time:  Friday February 12 2018 04:02:51 EDT Ventricular Rate:  51 PR Interval:  170 QRS Duration: 102 QT Interval:  421 QTC Calculation: 388 R Axis:   78 Text Interpretation:  Sinus rhythm Minimal ST depression, lateral leads No significant change was found Confirmed by Ezequiel Essex [phone]) on 02/12/2018 4:05:59 AM   Radiology US Abdomen Limited Ruq  Result Date: 02/12/2018 CLINICAL DATA:  67 y/o  M; right upper quadrant pain. EXAM: ULTRASOUND ABDOMEN LIMITED RIGHT UPPER QUADRANT COMPARISON:  None. FINDINGS: Gallbladder: Large gallstone measuring up to 3.7 cm and gallbladder sludge. No gallbladder wall thickening or pericholecystic fluid. Negative sonographic Murphy's sign. Common bile duct: Diameter: 4.5 mm Liver: No focal lesion identified. Within normal limits in parenchymal echogenicity. Portal vein is  patent on color Doppler imaging with normal direction of blood flow towards the liver. IMPRESSION: Cholelithiasis.  No secondary signs of acute cholecystitis. Electronically Signed   By: Kristine Garbe M.D.   On: 02/12/2018 05:29    Procedures Procedures (including critical care time)  Medications Ordered in ED Medications  sodium chloride 0.9 % bolus 1,000 mL (has no administration in time range)  ondansetron (ZOFRAN) injection 4 mg (has no administration in time range)  morphine 4 MG/ML injection 4 mg (has no administration in time range)     Initial Impression / Assessment and Plan / ED Course  I have reviewed the triage vital signs and the nursing notes.  Pertinent labs & imaging results that were available during my care of the patient were reviewed by me and considered in my medical decision making (see chart for details).    Right upper quadrant pain over the past week to become acutely worse this morning.  EKG is sinus bradycardia.  White count is normal.  Transaminitis noted with normal lipase. Ultrasound is obtained to evaluate for gallbladder pathology.  This shows a large gallstone with sludge.  There is no pericholecystic fluid or gallbladder wall thickening. Discussed with Dr. Hulen Skains of general surgery. They will evaluate.  Recommends admission to medicine and IV antibiotics.  Does not feel CT scan necessary at this point.  Patient feels improved. HR stable. No CP. Troponin negative.  Medical admission for IV antibiotics and eventual surgery.  Final Clinical Impressions(s) / ED Diagnoses   Final diagnoses:  RUQ pain    ED Discharge Orders    None       Aprile Dickenson, Annie Main, MD 02/12/18 [phone]

## 2018-02-12 NOTE — Progress Notes (Signed)
   Subjective/Chief Complaint: Less RUQ pain   Objective: Vital signs in last 24 hours: Temp:  [97.7 F (36.5 C)-97.8 F (36.6 C)] 97.8 F (36.6 C) (06/28 0743) Pulse Rate:  [41-77] 45 (06/28 0743) Resp:  [10-20] 16 (06/28 0743) BP: (127-180)/(59-103) 127/73 (06/28 0743) SpO2:  [93 %-100 %] 95 % (06/28 0743) Weight:  [95.3 kg (210 lb)-97.3 kg (214 lb 8.1 oz)] 97.3 kg (214 lb 8.1 oz) (06/28 0747) Last BM Date: 02/12/18  Intake/Output from previous day: 06/27 0701 - 06/28 0700 In: 1000 [IV Piggyback:1000] Out: -  Intake/Output this shift: No intake/output data recorded.  General appearance: alert and cooperative Resp: clear to auscultation bilaterally Cardio: irregularly irregular rhythm GI: soft, mild TTP RUQ  Lab Results:  Recent Labs    02/12/18 0306  WBC 9.7  HGB 15.2  HCT 45.2  PLT 168   BMET Recent Labs    02/12/18 0306  NA 140  K 4.1  CL 102  CO2 29  GLUCOSE 146*  BUN 10  CREATININE 1.33*  CALCIUM 9.8   PT/INR No results for input(s): LABPROT, INR in the last 72 hours. ABG No results for input(s): PHART, HCO3 in the last 72 hours.  Invalid input(s): PCO2, PO2  Studies/Results: US Abdomen Limited Ruq  Result Date: 02/12/2018 CLINICAL DATA:  67 y/o  M; right upper quadrant pain. EXAM: ULTRASOUND ABDOMEN LIMITED RIGHT UPPER QUADRANT COMPARISON:  None. FINDINGS: Gallbladder: Large gallstone measuring up to 3.7 cm and gallbladder sludge. No gallbladder wall thickening or pericholecystic fluid. Negative sonographic Murphy's sign. Common bile duct: Diameter: 4.5 mm Liver: No focal lesion identified. Within normal limits in parenchymal echogenicity. Portal vein is patent on color Doppler imaging with normal direction of blood flow towards the liver. IMPRESSION: Cholelithiasis.  No secondary signs of acute cholecystitis. Electronically Signed   By: Kristine Garbe M.D.   On: 02/12/2018 05:29    Anti-infectives: Anti-infectives (From admission,  onward)   Start     Dose/Rate Route Frequency Ordered Stop   02/13/18 0000  cefTRIAXone (ROCEPHIN) 2 g in sodium chloride 0.9 % 100 mL IVPB     2 g 200 mL/hr over 30 Minutes Intravenous Every 24 hours 02/12/18 0709     02/12/18 0600  cefTRIAXone (ROCEPHIN) 2 g in sodium chloride 0.9 % 100 mL IVPB     2 g 200 mL/hr over 30 Minutes Intravenous  Once 02/12/18 0549 02/12/18 1610      Assessment/Plan: Cholecystitis with cholelithiasis - appreciate cardiology eval. They plan to check P2 Y 12 Sunday for possible lap chole Monday. I D/W him and his wife. ID - Rocephin  LOS: 0 days    Zenovia Jarred 02/12/2018

## 2018-02-12 NOTE — H&P (Signed)
History and Physical    Travis Jenkins TGG:[id number] DOB: July 03, 1951 DOA: 02/12/2018   PCP: Lujean Amel, MD   Patient coming from:  Home    Chief Complaint: Abdominal pain  HPI: Travis Jenkins is a 67 y.o. male with medical history significant for CAD status post stent on Brilinta, history of bradycardia, history of self diagnosed IBS, presenting with 1 week history of right upper quadrant pain, initially felt by the patient to be due to a "stomach bug".  He reported this pain to be intermittent, worse after eating, and last night, it was severe enough that he presented to the hospital for further evaluation.  He did have some fried food yesterday.  Pain at times refers to his back.  He denies any diarrhea or constipation.  He notes nausea but no vomiting.  He denies any dysuria or gross hematuria.  The patient still has his gallbladder and appendix.  He denies any fever or chills or night sweats.  He denies any chest pain or palpitations.  He denies this pain being similar to that of his chest pain in the past.  He denies any shortness of breath.  He denies any leg swelling or calf pain.  He denies any tobacco, alcohol or recreational drug use.  with history of CAD status post stent, bradycardia, self diagnosed IBS presenting with "stomach bug" for the past 1 week.  Reports pain in his upper abdomen that comes and goes.  He has not noticed any association with food.  Or diarrhea.  No fever.  No pain with urination or blood in the urine.  He has had nausea but no vomiting.  He is awoken from sleep today with a severe episode of this pain.  He has not had any vomiting pain is not similar to his previous cardiac pain.  Still has a gallbladder and appendix in place.  Pain is in his right upper abdomen but spreads diffusely and radiates to his right back.  ED Course:  BP 127/73 (BP Location: Left Arm)   Pulse (!) 45   Temp 97.8 F (36.6 C) (Oral)   Resp 16   Ht 6\' 1"  (1.854 m)   Wt 97.3 kg  (214 lb 8.1 oz)   SpO2 95%   BMI 28.30 kg/m   Glucose 146, creatinine 1.33, AST 306, ALT 165, creatinine 1.33  lipase 36 acute hepatitis panel is pending Troponin 0 White count 9.7, hemoglobin 15.2, platelets 168 Urinalysis negative Tylenol level negative 2D echo shows EF 50 to 70%, normal systolic Catheterization 3/50 2018, showing residual ostial diagonal 2 disease, to be treated medically, status post stent to the mid LAD.  Patient has been placed since then and dual antiplatelet therapy with aspirin and Brilinta for 12 months, due in May 15, 2018 EKG sinus rhythm, minimal ST depression in the lateral leads, without significant change since prior.  Patient received 1 bolus of 1 L of IV normal saline, Zofran and morphine for pain with good control of the symptoms Ultrasound of the gallbladder shows large gallstone with sludge, no pericholecystic fluid or gallbladder wall thickening.  Dr. Hulen Skains has seen and evaluated the patient, recommending admission to medicine for IV antibiotics, and symptomatic management.  They do not recommend a CT scan at this point.  Possible surgery today after clearance with cardiology    Review of Systems:  As per HPI otherwise all other systems reviewed and are negative  Past Medical History:  Diagnosis Date  . Bradycardia 05/01/2017  .  Coronary artery disease   . IBS (irritable bowel syndrome)   . Raynaud's disease   . Syncope     Past Surgical History:  Procedure Laterality Date  . ANKLE FRACTURE SURGERY    . ANTERIOR CRUCIATE LIGAMENT REPAIR  1990  . CARDIAC CATHETERIZATION    . CORONARY STENT INTERVENTION N/A 05/01/2017   Procedure: CORONARY STENT INTERVENTION;  Surgeon: Nelva Bush, MD;  Location: [facility];  Service: Cardiovascular;  Laterality: N/A;  . INTRAVASCULAR PRESSURE WIRE/FFR STUDY N/A 05/01/2017   Procedure: INTRAVASCULAR PRESSURE WIRE/FFR STUDY;  Surgeon: Nelva Bush, MD;  Location: [facility];   Service: Cardiovascular;  Laterality: N/A;  . LEFT HEART CATH AND CORONARY ANGIOGRAPHY N/A 05/01/2017   Procedure: LEFT HEART CATH AND CORONARY ANGIOGRAPHY;  Surgeon: Nelva Bush, MD;  Location: [facility];  Service: Cardiovascular;  Laterality: N/A;    Social History Social History   Socioeconomic History  . Marital status: Married    Spouse name: Not on file  . Number of children: Not on file  . Years of education: Not on file  . Highest education level: Not on file  Occupational History  . Not on file  Social Needs  . Financial resource strain: Not on file  . Food insecurity:    Worry: Not on file    Inability: Not on file  . Transportation needs:    Medical: Not on file    Non-medical: Not on file  Tobacco Use  . Smoking status: Former Smoker    Packs/day: 0.50    Years: 12.00    Pack years: 6.00    Types: Cigarettes    Start date: 06/20/1966    Last attempt to quit: 08/19/1971    Years since quitting: 46.5  . Smokeless tobacco: Never Used  Substance and Sexual Activity  . Alcohol use: Yes    Alcohol/week: 2.4 oz    Types: 4 Cans of beer per week  . Drug use: No  . Sexual activity: Not on file  Lifestyle  . Physical activity:    Days per week: Not on file    Minutes per session: Not on file  . Stress: Not on file  Relationships  . Social connections:    Talks on phone: Not on file    Gets together: Not on file    Attends religious service: Not on file    Active member of club or organization: Not on file    Attends meetings of clubs or organizations: Not on file    Relationship status: Not on file  . Intimate partner violence:    Fear of current or ex partner: Not on file    Emotionally abused: Not on file    Physically abused: Not on file    Forced sexual activity: Not on file  Other Topics Concern  . Not on file  Social History Narrative  . Not on file     Allergies  Allergen Reactions  . Wasp Venom Anaphylaxis    In Sept 2018, stung  by multiple yellow jackets  . Erythromycin Rash  . Tetracyclines & Related Rash    Family History  Problem Relation Age of Onset  . Hyperlipidemia Father   . Diabetes Father   . Heart disease Father   . Diabetes Mother   . Parkinson's disease Mother   . Heart disease Mother        Prior to Admission medications   Medication Sig Start Date End Date Taking?  Authorizing Provider  acetaminophen (TYLENOL) 325 MG tablet Take 325-650 mg by mouth every 6 (six) hours as needed (for pain or headaches).   Yes [provider]  aspirin EC 81 MG tablet Take 81 mg by mouth daily.   Yes [provider]  nitroGLYCERIN (NITROSTAT) 0.4 MG SL tablet Place 1 tablet (0.4 mg total) under the tongue every 5 (five) minutes as needed for chest pain. 05/02/17  Yes Sheikh, Omair Latif, DO  rosuvastatin (CRESTOR) 20 MG tablet Take 1 tablet (20 mg total) by mouth daily. 05/15/17  Yes Almyra Deforest, PA  ticagrelor (BRILINTA) 90 MG TABS tablet Take 1 tablet (90 mg total) by mouth 2 (two) times daily. 05/15/17  Yes Almyra Deforest, [place]     Physical Exam:  Vitals:   02/12/18 0615 02/12/18 1914 02/12/18 0743 02/12/18 0747  BP:  133/80 127/73   Pulse: (!) 59 (!) 56 (!) 45   Resp:  10 16   Temp:   97.8 F (36.6 C)   TempSrc:   Oral   SpO2: 94% 97% 95%   Weight:    97.3 kg (214 lb 8.1 oz)  Height:    6\' 1"  (1.854 m)   Constitutional: NAD, calm, comfortable at the time of evaluation eyes: PERRL, lids and conjunctivae normal ENMT: Mucous membranes are moist, without exudate or lesions  Neck: normal, supple, no masses, no thyromegaly Respiratory: clear to auscultation bilaterally, no wheezing, no crackles. Normal respiratory effort  Cardiovascular: Regular bradycardia rate and rhythm, no murmur, rubs or gallops. No extremity edema. 2+ pedal pulses. No carotid bruits.  Abdomen: Tenderness in the right upper quadrant and epigastric area, without rigidity or rebound or guarding.  Mild CVAT on the  right Musculoskeletal: no clubbing / cyanosis. Moves all extremities Skin: no jaundice, No lesions.  Neurologic: Sensation intact  Strength equal in all extremities Psychiatric:   Alert and oriented x 3. Normal mood.     Labs on Admission: I have personally reviewed following labs and imaging studies  CBC: Recent Labs  Lab 02/12/18 0306  WBC 9.7  HGB 15.2  HCT 45.2  MCV 92.2  PLT 782    Basic Metabolic Panel: Recent Labs  Lab 02/12/18 0306  NA 140  K 4.1  CL 102  CO2 29  GLUCOSE 146*  BUN 10  CREATININE 1.33*  CALCIUM 9.8    GFR: Estimated Creatinine Clearance: 68.1 mL/min (A) (by C-G formula based on SCr of 1.33 mg/dL (H)).  Liver Function Tests: Recent Labs  Lab 02/12/18 0306  AST 306*  ALT 165*  ALKPHOS 77  BILITOT 1.0  PROT 6.8  ALBUMIN 4.4   Recent Labs  Lab 02/12/18 0306  LIPASE 36   No results for input(s): AMMONIA in the last 168 hours.  Coagulation Profile: No results for input(s): INR, PROTIME in the last 168 hours.  Cardiac Enzymes: No results for input(s): CKTOTAL, CKMB, CKMBINDEX, TROPONINI in the last 168 hours.  BNP (last 3 results) No results for input(s): PROBNP in the last 8760 hours.  HbA1C: No results for input(s): HGBA1C in the last 72 hours.  CBG: No results for input(s): GLUCAP in the last 168 hours.  Lipid Profile: No results for input(s): CHOL, HDL, LDLCALC, TRIG, CHOLHDL, LDLDIRECT in the last 72 hours.  Thyroid Function Tests: No results for input(s): TSH, T4TOTAL, FREET4, T3FREE, THYROIDAB in the last 72 hours.  Anemia Panel: No results for input(s): VITAMINB12, FOLATE, FERRITIN, TIBC, IRON, RETICCTPCT in the last 72 hours.  Urine  analysis:    Component Value Date/Time   COLORURINE YELLOW 02/12/2018 0310   APPEARANCEUR CLOUDY (A) 02/12/2018 0310   LABSPEC 1.008 02/12/2018 0310   PHURINE 8.0 02/12/2018 0310   GLUCOSEU NEGATIVE 02/12/2018 0310   HGBUR NEGATIVE 02/12/2018 0310   BILIRUBINUR NEGATIVE  02/12/2018 0310   KETONESUR NEGATIVE 02/12/2018 0310   PROTEINUR NEGATIVE 02/12/2018 0310   NITRITE NEGATIVE 02/12/2018 0310   LEUKOCYTESUR NEGATIVE 02/12/2018 0310    Sepsis Labs: @LABRCNTIP (procalcitonin:4,lacticidven:4) )No results found for this or any previous visit (from the past 240 hour(s)).   Radiological Exams on Admission: US Abdomen Limited Ruq  Result Date: 02/12/2018 CLINICAL DATA:  67 y/o  M; right upper quadrant pain. EXAM: ULTRASOUND ABDOMEN LIMITED RIGHT UPPER QUADRANT COMPARISON:  None. FINDINGS: Gallbladder: Large gallstone measuring up to 3.7 cm and gallbladder sludge. No gallbladder wall thickening or pericholecystic fluid. Negative sonographic Murphy's sign. Common bile duct: Diameter: 4.5 mm Liver: No focal lesion identified. Within normal limits in parenchymal echogenicity. Portal vein is patent on color Doppler imaging with normal direction of blood flow towards the liver. IMPRESSION: Cholelithiasis.  No secondary signs of acute cholecystitis. Electronically Signed   By: Kristine Garbe M.D.   On: 02/12/2018 05:29    EKG: Independently reviewed.  Assessment/Plan Principal Problem:   Cholelithiasis Active Problems:   Abdominal pain   Bradycardia   CKD (chronic kidney disease) stage 2, GFR 60-89 ml/min   Hyperlipidemia   NSTEMI (non-ST elevated myocardial infarction) (HCC)   CAD (coronary artery disease)   IBS (irritable bowel syndrome)   Elevated glucose   Elevated serum creatinine  Abdominal pain in the setting of cholelithiasis with biliary colic, confirmed by ultrasound of the abdomen.  Dr. Hulen Skains, general surgery has seen and evaluated the patient and recommended cholecystectomy today, after cardiac clearance, in view of the patient having a history of bradycardia, and being on Brilinta. White count, lipase essentially normal. AST 306, ALT 165,  Patient is afebrile.  The Rocephin, IV fluids and pain medications and 1 dose of bentyl for better  control of symptoms.  He appears more comfortable at this time. Admit to Med Surg patient  NPO  IVF while NPO. Continue Rocephin for intra-abdominal coverage Follow blood cultures Appreciate surgery involvement  History of bradycardia , CAD, status post cardiac catheterization for NSTEMI September 2018 showing residual ostial diagonal 2 disease, to be treated medically, status post stent to the mid LAD.  Patient has been placed since then and dual antiplatelet therapy with aspirin and Brilinta for 12 months, due in May 15, 2018 EKG sinus rhythm, minimal ST depression in the lateral leads, without significant change since prior.  Of note, his rate has been stable between 50s and 60s, and blood pressure is stable patient is cardiac pain free at this time.   Aspirin and Brilinta are on hold at this time Cardiology consultation has been requested, for cardiac clearance.  Appreciate involvement   Hyperlipidemia Continue home statins Check lipid panel  Elevated Glucose Patient noted to have elevated Glucose in labs at 146 . No prior documented history of DM Check A1C Check CBG bid   Mild acute Kidney Injury likely due to dehydration, current creatinine 1.33, with GFR 55, normal creatinine is 1.  Is receiving IV fluids at this time Lab Results  Component Value Date   CREATININE 1.33 (H) 02/12/2018   CREATININE 1.05 05/02/2017   CREATININE 1.06 05/01/2017  Continue IV fluids IVF Chemistries in a.m.     DVT prophylaxis:  SCD in view of possible surgery today Code Status:    Full Family Communication:  Discussed with patient Disposition Plan: Expect patient to be discharged to home after condition improves Consults called:    Dr. Hulen Skains, general surgery by EDP Admission status: MedSurg inpatient   Sharene Butters, PA-C [facility]   Amion text  [phone]   02/12/2018, 7:53 AM

## 2018-02-12 NOTE — ED Triage Notes (Signed)
Pt arrives to triage, reporting that he has had abdominal pain on and off for about a week, worse tonight. Pain radiates to his back, mild nausea. No meds PTA

## 2018-02-12 NOTE — ED Notes (Signed)
Admitting team at bedside.

## 2018-02-12 NOTE — ED Notes (Addendum)
Hulen Skains, MD at bedside.

## 2018-02-12 NOTE — Progress Notes (Signed)
Pt new admit from ED Dx Cholelitiasis, alert and oriented, wife at the bedside, no complain of pain at this time.

## 2018-02-12 NOTE — Care Management (Signed)
This is a no charge note  Pending admission per Dr. Eliezer Champagne  68 year old male with past medical history for bradycardia, hyperlipidemia, CAD on aspirin and Brilinta, who presents with intermittent abdominal pain for 1 week.  Ultrasound showed gallstone without evidence of cholecystitis.  No fever or leukocytosis.  Patient is admitted to telemetry bed as inpatient.  General surgeon, Dr. Hulen Skains was consulted.  Ivor Costa, MD  [facility] Pager [phone]  If 7PM-7AM, please contact night-coverage [email or web address] Password [facility] 02/12/2018, 6:12 AM

## 2018-02-13 DIAGNOSIS — Z01810 Encounter for preprocedural cardiovascular examination: Secondary | ICD-10-CM

## 2018-02-13 LAB — COMPREHENSIVE METABOLIC PANEL
ALT: 96 U/L — ABNORMAL HIGH (ref 0–44)
AST: 61 U/L — ABNORMAL HIGH (ref 15–41)
Albumin: 3.4 g/dL — ABNORMAL LOW (ref 3.5–5.0)
Alkaline Phosphatase: 67 U/L (ref 38–126)
Anion gap: 10 (ref 5–15)
BUN: 9 mg/dL (ref 8–23)
CO2: 24 mmol/L (ref 22–32)
Calcium: 8.3 mg/dL — ABNORMAL LOW (ref 8.9–10.3)
Chloride: 106 mmol/L (ref 98–111)
Creatinine, Ser: 1.14 mg/dL (ref 0.61–1.24)
GFR calc Af Amer: >60 mL/min (ref >60)
GFR calc non Af Amer: >60 mL/min (ref >60)
Glucose, Bld: 100 mg/dL — ABNORMAL HIGH (ref 70–99)
Potassium: 4.1 mmol/L (ref 3.5–5.1)
Sodium: 140 mmol/L (ref 135–145)
Total Bilirubin: 0.7 mg/dL (ref 0.3–1.2)
Total Protein: 5.8 g/dL — ABNORMAL LOW (ref 6.5–8.1)

## 2018-02-13 LAB — CBC
HCT: 41.2 % (ref 39.0–52.0)
Hemoglobin: 13.6 g/dL (ref 13.0–17.0)
MCH: 30.8 pg (ref 26.0–34.0)
MCHC: 33 g/dL (ref 30.0–36.0)
MCV: 93.2 fL (ref 78.0–100.0)
Platelets: 156 10*3/uL (ref 150–400)
RBC: 4.42 MIL/uL (ref 4.22–5.81)
RDW: 12.2 % (ref 11.5–15.5)
WBC: 4.9 10*3/uL (ref 4.0–10.5)

## 2018-02-13 LAB — GLUCOSE, CAPILLARY
Glucose-Capillary: 94 mg/dL (ref 70–99)
Glucose-Capillary: 96 mg/dL (ref 70–99)
Glucose-Capillary: 89 mg/dL (ref 70–99)

## 2018-02-13 LAB — PROTIME-INR
INR: 1.06
Prothrombin Time: 13.7 seconds (ref 11.4–15.2)

## 2018-02-13 LAB — HEPATITIS PANEL, ACUTE
HCV Ab: <0.1 s/co ratio (ref 0.0–0.9)
Hep A IgM: NEGATIVE
Hep B C IgM: NEGATIVE
Hepatitis B Surface Ag: NEGATIVE

## 2018-02-13 NOTE — Progress Notes (Signed)
PROGRESS NOTE  Travis Jenkins LDJ:[id number] DOB: 1950-12-06 DOA: 02/12/2018 PCP: Lujean Amel, MD   LOS: 1 day   Brief Narrative / Interim history: 67 year old male with history of CAD status post cath and stent September 2018, who was admitted to the hospital on 6/28 with right upper quadrant abdominal pain after eating fatty foods, imaging showed cholelithiasis, had mild LFT elevation.  General surgery was consulted and patient will be admitted with biliary colic and he is scheduled to get a cholecystectomy early next week.  He has been on Brilinta which is now on hold.  Cardiology consulted for preop evaluation  Assessment & Plan: Principal Problem:   Cholelithiasis Active Problems:   Bradycardia   CKD (chronic kidney disease) stage 2, GFR 60-89 ml/min   Hyperlipidemia   NSTEMI (non-ST elevated myocardial infarction) (HCC)   CAD (coronary artery disease)   Abdominal pain   IBS (irritable bowel syndrome)   Elevated glucose   Elevated serum creatinine   Abdominal pain in the setting of cholelithiasis with biliary colic -General surgery following, plan for cholecystectomy Monday or Tuesday, Brilinta on hold  Coronary artery disease -Status post stenting of proximal-mid LAD in September 2018, cardiology following, no further testing at this point from cardiac standpoint. -Check P2 Y 12 level tomorrow  Hyperlipidemia -Continue statin  Elevated LFTs -Due to #1, repeat CMP tomorrow morning   DVT prophylaxis: SCDs Code Status: Full code Family Communication: no family at bedside Disposition Plan: home when ready   Consultants:   General surgery  Cardiology   Procedures:   None  Antimicrobials:  Ceftriaxone 6/28 >>   Subjective: - no chest pain, shortness of breath, no abdominal pain, nausea or vomiting.   Objective: Vitals:   02/12/18 0743 02/12/18 0747 02/12/18 2123 02/13/18 0540  BP: 127/73  (!) 144/74 115/63  Pulse: (!) 45  60 (!) 58  Resp: 16  17  18   Temp: 97.8 F (36.6 C)  98.8 F (37.1 C) 97.9 F (36.6 C)  TempSrc: Oral  Oral Oral  SpO2: 95%  97% 96%  Weight:  97.3 kg (214 lb 8.1 oz)    Height:  6\' 1"  (1.854 m)      Intake/Output Summary (Last 24 hours) at 02/13/2018 1218 Last data filed at 02/12/2018 2340 Gross per 24 hour  Intake 100 ml  Output -  Net 100 ml   Filed Weights   02/12/18 0301 02/12/18 0747  Weight: 95.3 kg (210 lb) 97.3 kg (214 lb 8.1 oz)    Examination:  Constitutional: NAD ENMT: Mucous membranes are moist. Neck: normal, supple, no masses, no thyromegaly Respiratory: clear to auscultation bilaterally, no wheezing, no crackles. Cardiovascular: Regular rate and rhythm, no murmurs / rubs / gallops.  Abdomen: no tenderness. Bowel sounds positive.  Skin: no rashes Neurologic: CN 2-12 grossly intact. Strength 5/5 in all 4.  Psychiatric: Normal judgment and insight. Alert and oriented x 3. Normal mood.    Data Reviewed: I have independently reviewed following labs and imaging studies   CBC: Recent Labs  Lab 02/12/18 0306 02/13/18 0658  WBC 9.7 4.9  HGB 15.2 13.6  HCT 45.2 41.2  MCV 92.2 93.2  PLT 168 030   Basic Metabolic Panel: Recent Labs  Lab 02/12/18 0306 02/13/18 0658  NA 140 140  K 4.1 4.1  CL 102 106  CO2 29 24  GLUCOSE 146* 100*  BUN 10 9  CREATININE 1.33* 1.14  CALCIUM 9.8 8.3*   GFR: Estimated Creatinine Clearance: 79.4 mL/min (  by C-G formula based on SCr of 1.14 mg/dL). Liver Function Tests: Recent Labs  Lab 02/12/18 0306 02/13/18 0658  AST 306* 61*  ALT 165* 96*  ALKPHOS 77 67  BILITOT 1.0 0.7  PROT 6.8 5.8*  ALBUMIN 4.4 3.4*   Recent Labs  Lab 02/12/18 0306  LIPASE 36   No results for input(s): AMMONIA in the last 168 hours. Coagulation Profile: Recent Labs  Lab 02/13/18 0658  INR 1.06   Cardiac Enzymes: No results for input(s): CKTOTAL, CKMB, CKMBINDEX, TROPONINI in the last 168 hours. BNP (last 3 results) No results for input(s): PROBNP in the  last 8760 hours. HbA1C: Recent Labs    02/12/18 0830  HGBA1C 5.8*   CBG: Recent Labs  Lab 02/12/18 1831 02/12/18 2126 02/13/18 0633 02/13/18 0751  GLUCAP 77 141* 94 96   Lipid Profile: Recent Labs    02/12/18 0756  CHOL 107  HDL 40*  LDLCALC 40  TRIG 135  CHOLHDL 2.7   Thyroid Function Tests: No results for input(s): TSH, T4TOTAL, FREET4, T3FREE, THYROIDAB in the last 72 hours. Anemia Panel: No results for input(s): VITAMINB12, FOLATE, FERRITIN, TIBC, IRON, RETICCTPCT in the last 72 hours. Urine analysis:    Component Value Date/Time   COLORURINE YELLOW 02/12/2018 0310   APPEARANCEUR CLOUDY (A) 02/12/2018 0310   LABSPEC 1.008 02/12/2018 0310   PHURINE 8.0 02/12/2018 0310   GLUCOSEU NEGATIVE 02/12/2018 0310   HGBUR NEGATIVE 02/12/2018 0310   BILIRUBINUR NEGATIVE 02/12/2018 0310   KETONESUR NEGATIVE 02/12/2018 0310   PROTEINUR NEGATIVE 02/12/2018 0310   NITRITE NEGATIVE 02/12/2018 0310   LEUKOCYTESUR NEGATIVE 02/12/2018 0310   Sepsis Labs: Invalid input(s): PROCALCITONIN, LACTICIDVEN  No results found for this or any previous visit (from the past 240 hour(s)).    Radiology Studies: US Abdomen Limited Ruq  Result Date: 02/12/2018 CLINICAL DATA:  68 y/o  M; right upper quadrant pain. EXAM: ULTRASOUND ABDOMEN LIMITED RIGHT UPPER QUADRANT COMPARISON:  None. FINDINGS: Gallbladder: Large gallstone measuring up to 3.7 cm and gallbladder sludge. No gallbladder wall thickening or pericholecystic fluid. Negative sonographic Murphy's sign. Common bile duct: Diameter: 4.5 mm Liver: No focal lesion identified. Within normal limits in parenchymal echogenicity. Portal vein is patent on color Doppler imaging with normal direction of blood flow towards the liver. IMPRESSION: Cholelithiasis.  No secondary signs of acute cholecystitis. Electronically Signed   By: Kristine Garbe M.D.   On: 02/12/2018 05:29     Scheduled Meds: . dicyclomine  20 mg Intramuscular Once  .  iohexol  100 mL Intravenous Once  . rosuvastatin  20 mg Oral Daily   Continuous Infusions: . cefTRIAXone (ROCEPHIN)  IV 2 g (02/12/18 2310)    Marzetta Board, MD, PhD [facility] Pager [phone]  If 7PM-7AM, please contact night-coverage [email or web address] Password [facility] 02/13/2018, 12:18 PM

## 2018-02-13 NOTE — Progress Notes (Signed)
Preop consult per Dr Acie Fredrickson, no additional cardiology recs at this time over the weekend. Call with questions   Zandra Abts MD

## 2018-02-13 NOTE — Progress Notes (Signed)
  Subjective: Feeling better.  No pain.  No nausea Enjoying breakfast  Objective: Vital signs in last 24 hours: Temp:  [97.9 F (36.6 C)-98.8 F (37.1 C)] 97.9 F (36.6 C) (06/29 0540) Pulse Rate:  [58-60] 58 (06/29 0540) Resp:  [17-18] 18 (06/29 0540) BP: (115-144)/(63-74) 115/63 (06/29 0540) SpO2:  [96 %-97 %] 96 % (06/29 0540) Last BM Date: 02/12/18  Intake/Output from previous day: 06/28 0701 - 06/29 0700 In: 100 [IV Piggyback:100] Out: -  Intake/Output this shift: No intake/output data recorded.  General appearance: Alert.  Pleasant.  Oriented.  Excellent insight.  No distress Resp: clear to auscultation bilaterally GI: soft, non-tender; bowel sounds normal; no masses,  no organomegaly  Lab Results:  Recent Labs    02/12/18 0306 02/13/18 0658  WBC 9.7 4.9  HGB 15.2 13.6  HCT 45.2 41.2  PLT 168 156   BMET Recent Labs    02/12/18 0306 02/13/18 0658  NA 140 140  K 4.1 4.1  CL 102 106  CO2 29 24  GLUCOSE 146* 100*  BUN 10 9  CREATININE 1.33* 1.14  CALCIUM 9.8 8.3*   PT/INR Recent Labs    02/13/18 0658  LABPROT 13.7  INR 1.06   ABG No results for input(s): PHART, HCO3 in the last 72 hours.  Invalid input(s): PCO2, PO2  Studies/Results: US Abdomen Limited Ruq  Result Date: 02/12/2018 CLINICAL DATA:  67 y/o  M; right upper quadrant pain. EXAM: ULTRASOUND ABDOMEN LIMITED RIGHT UPPER QUADRANT COMPARISON:  None. FINDINGS: Gallbladder: Large gallstone measuring up to 3.7 cm and gallbladder sludge. No gallbladder wall thickening or pericholecystic fluid. Negative sonographic Murphy's sign. Common bile duct: Diameter: 4.5 mm Liver: No focal lesion identified. Within normal limits in parenchymal echogenicity. Portal vein is patent on color Doppler imaging with normal direction of blood flow towards the liver. IMPRESSION: Cholelithiasis.  No secondary signs of acute cholecystitis. Electronically Signed   By: Kristine Garbe M.D.   On: 02/12/2018  05:29    Anti-infectives: Anti-infectives (From admission, onward)   Start     Dose/Rate Route Frequency Ordered Stop   02/13/18 0000  cefTRIAXone (ROCEPHIN) 2 g in sodium chloride 0.9 % 100 mL IVPB     2 g 200 mL/hr over 30 Minutes Intravenous Every 24 hours 02/12/18 0709     02/12/18 0600  cefTRIAXone (ROCEPHIN) 2 g in sodium chloride 0.9 % 100 mL IVPB     2 g 200 mL/hr over 30 Minutes Intravenous  Once 02/12/18 0549 02/12/18 0648      Assessment/Plan:  Gallstones with biliary colic versus low-grade cholecystitis.  He is stable Elevated transaminase levels, will repeat liver function test tomorrow Continue Rocephin Ambulation encouraged  CAD, status post stenting mid LAD, September 2018, on Brilinta. Brilinta on hold and cardiology following To check levels tomorrow Hopefully cholecystectomy Monday or Tuesday.      LOS: 1 day    Adin Hector 02/13/2018

## 2018-02-14 LAB — CBC
HCT: 44.7 % (ref 39.0–52.0)
Hemoglobin: 14.8 g/dL (ref 13.0–17.0)
MCH: 30.8 pg (ref 26.0–34.0)
MCHC: 33.1 g/dL (ref 30.0–36.0)
MCV: 92.9 fL (ref 78.0–100.0)
Platelets: 159 10*3/uL (ref 150–400)
RBC: 4.81 MIL/uL (ref 4.22–5.81)
RDW: 12 % (ref 11.5–15.5)
WBC: 5.8 10*3/uL (ref 4.0–10.5)

## 2018-02-14 LAB — COMPREHENSIVE METABOLIC PANEL
ALT: 73 U/L — ABNORMAL HIGH (ref 0–44)
AST: 32 U/L (ref 15–41)
Albumin: 4.1 g/dL (ref 3.5–5.0)
Alkaline Phosphatase: 65 U/L (ref 38–126)
Anion gap: 8 (ref 5–15)
BUN: 11 mg/dL (ref 8–23)
CO2: 27 mmol/L (ref 22–32)
Calcium: 9 mg/dL (ref 8.9–10.3)
Chloride: 104 mmol/L (ref 98–111)
Creatinine, Ser: 1.13 mg/dL (ref 0.61–1.24)
GFR calc Af Amer: >60 mL/min (ref >60)
GFR calc non Af Amer: >60 mL/min (ref >60)
Glucose, Bld: 99 mg/dL (ref 70–99)
Potassium: 4 mmol/L (ref 3.5–5.1)
Sodium: 139 mmol/L (ref 135–145)
Total Bilirubin: 0.9 mg/dL (ref 0.3–1.2)
Total Protein: 6.5 g/dL (ref 6.5–8.1)

## 2018-02-14 LAB — PLATELET INHIBITION P2Y12: Platelet Function  P2Y12: 121 [PRU] — ABNORMAL LOW (ref 194–418)

## 2018-02-14 MED ORDER — GABAPENTIN 300 MG PO CAPS: 300.0000 mg | ORAL_CAPSULE | ORAL | Status: AC

## 2018-02-14 MED ORDER — CHLORHEXIDINE GLUCONATE CLOTH 2 % EX PADS
6.0000 | MEDICATED_PAD | Freq: Once | CUTANEOUS | Status: AC
  Administered 2018-02-14: 6 via TOPICAL

## 2018-02-14 MED ORDER — ASPIRIN EC 81 MG PO TBEC
81.0000 mg | DELAYED_RELEASE_TABLET | Freq: Every day | ORAL | Status: DC
  Administered 2018-02-14 – 2018-02-18 (×4): 81 mg via ORAL
  Filled 2018-02-14 (×4): qty 1

## 2018-02-14 MED ORDER — ACETAMINOPHEN 500 MG PO TABS: 1000.0000 mg | ORAL_TABLET | ORAL | Status: AC

## 2018-02-14 MED ORDER — CELECOXIB 200 MG PO CAPS: 200.0000 mg | ORAL_CAPSULE | ORAL | Status: AC

## 2018-02-14 NOTE — Progress Notes (Addendum)
  Subjective: Feels well.  No pain or nausea.  Tolerating diet. No lab work today. He is hoping that we can proceed with cholecystectomy tomorrow. Afebrile.  BP 130/73.  Heart rate 52. Remains on Rocephin  Objective: Vital signs in last 24 hours: Temp:  [97.5 F (36.4 C)-98.5 F (36.9 C)] 97.7 F (36.5 C) (06/30 0541) Pulse Rate:  [49-58] 52 (06/30 0541) Resp:  [17] 17 (06/30 0541) BP: (129-137)/(73-87) 130/73 (06/30 0541) SpO2:  [96 %-99 %] 96 % (06/30 0541) Last BM Date: 02/12/18  Intake/Output from previous day: 06/29 0701 - 06/30 0700 In: 462 [P.O.:462] Out: -  Intake/Output this shift: No intake/output data recorded.   General appearance: Alert.  Pleasant.  Oriented.  Excellent insight.  No distress Resp: clear to auscultation bilaterally GI: soft, non-tender; bowel sounds normal; no masses,  no organomegaly      Lab Results:  Recent Labs    02/12/18 0306 02/13/18 0658  WBC 9.7 4.9  HGB 15.2 13.6  HCT 45.2 41.2  PLT 168 156   BMET Recent Labs    02/12/18 0306 02/13/18 0658  NA 140 140  K 4.1 4.1  CL 102 106  CO2 29 24  GLUCOSE 146* 100*  BUN 10 9  CREATININE 1.33* 1.14  CALCIUM 9.8 8.3*   PT/INR Recent Labs    02/13/18 0658  LABPROT 13.7  INR 1.06   ABG No results for input(s): PHART, HCO3 in the last 72 hours.  Invalid input(s): PCO2, PO2  Studies/Results: No results found.  Anti-infectives: Anti-infectives (From admission, onward)   Start     Dose/Rate Route Frequency Ordered Stop   02/13/18 0000  cefTRIAXone (ROCEPHIN) 2 g in sodium chloride 0.9 % 100 mL IVPB     2 g 200 mL/hr over 30 Minutes Intravenous Every 24 hours 02/12/18 0709     02/12/18 0600  cefTRIAXone (ROCEPHIN) 2 g in sodium chloride 0.9 % 100 mL IVPB     2 g 200 mL/hr over 30 Minutes Intravenous  Once 02/12/18 0549 02/12/18 0648      Assessment/Plan:   Gallstones with biliary colic versus low-grade cholecystitis.  He is stable Elevated transaminase  levels, CBC, CMP, and platelet inhibition P2 Y12 level pending--- I can see that this is ordered, just not drawn yet.  I spoke to his RN about this. Continue Rocephin Ambulation encouraged --Hopefully he will be cleared from a cardiology standpoint and we can proceed with cholecystectomy tomorrow --I have discussed the indications, details, techniques, and risk of laparoscopic cholecystectomy with him.  He is aware of the risk of bleeding, infection, conversion to open laparotomy, bile leak, injury to adjacent organs with major reconstructive surgery, port site hernia, and other unforeseen problems.  He understands all of these issues.  All of his questions were answered.  He agrees with this plan. ---We will make him n.p.o. after midnight.  CAD, status post stenting mid LAD, September 2018, on Brilinta. Brilinta on hold and cardiology following To check levels today Hopefully cholecystectomy Monday      LOS: 2 days    Adin Hector 02/14/2018

## 2018-02-14 NOTE — Progress Notes (Signed)
PROGRESS NOTE  ATHARVA MIRSKY SRP:[id number] DOB: 15-Sep-1950 DOA: 02/12/2018 PCP: Lujean Amel, MD   LOS: 2 days   Brief Narrative / Interim history: 67 year old male with history of CAD status post cath and stent September 2018, who was admitted to the hospital on 6/28 with right upper quadrant abdominal pain after eating fatty foods, imaging showed cholelithiasis, had mild LFT elevation.  General surgery was consulted and patient will be admitted with biliary colic and he is scheduled to get a cholecystectomy early next week.  He has been on Brilinta which is now on hold.  Cardiology consulted for preop evaluation  Assessment & Plan: Principal Problem:   Cholelithiasis Active Problems:   Bradycardia   CKD (chronic kidney disease) stage 2, GFR 60-89 ml/min   Hyperlipidemia   NSTEMI (non-ST elevated myocardial infarction) (HCC)   CAD (coronary artery disease)   Abdominal pain   IBS (irritable bowel syndrome)   Elevated glucose   Elevated serum creatinine   Preoperative cardiovascular examination   Abdominal pain in the setting of cholelithiasis with biliary colic -General surgery following, plan for cholecystectomy Monday or Tuesday, Brilinta on hold  Coronary artery disease -Status post stenting of proximal-mid LAD in September 2018, cardiology following, no further testing at this point from cardiac standpoint. -Check P2 Y 12 level today  Hyperlipidemia -Continue statin  Elevated LFTs -Due to #1, repeat CMP tomorrow morning   DVT prophylaxis: SCDs Code Status: Full code Family Communication: no family at bedside Disposition Plan: home when ready   Consultants:   General surgery  Cardiology   Procedures:   None  Antimicrobials:  Ceftriaxone 6/28 >>   Subjective: - feels well  Objective: Vitals:   02/13/18 0540 02/13/18 1350 02/13/18 2159 02/14/18 0541  BP: 115/63 129/83 137/87 130/73  Pulse: (!) 58 (!) 58 (!) 49 (!) 52  Resp: 18 17 17 17   Temp:  97.9 F (36.6 C) (!) 97.5 F (36.4 C) 98.5 F (36.9 C) 97.7 F (36.5 C)  TempSrc: Oral Oral Oral Oral  SpO2: 96% 96% 99% 96%  Weight:      Height:        Intake/Output Summary (Last 24 hours) at 02/14/2018 1215 Last data filed at 02/13/2018 1756 Gross per 24 hour  Intake 462 ml  Output -  Net 462 ml   Filed Weights   02/12/18 0301 02/12/18 0747  Weight: 95.3 kg (210 lb) 97.3 kg (214 lb 8.1 oz)    Examination:  Constitutional: NAD Respiratory: CTA, no wheezing  Cardiovascular: RRR   Data Reviewed: I have independently reviewed following labs and imaging studies   CBC: Recent Labs  Lab 02/12/18 0306 02/13/18 0658 02/14/18 0842  WBC 9.7 4.9 5.8  HGB 15.2 13.6 14.8  HCT 45.2 41.2 44.7  MCV 92.2 93.2 92.9  PLT 168 156 244   Basic Metabolic Panel: Recent Labs  Lab 02/12/18 0306 02/13/18 0658 02/14/18 0842  NA 140 140 139  K 4.1 4.1 4.0  CL 102 106 104  CO2 29 24 27   GLUCOSE 146* 100* 99  BUN 10 9 11   CREATININE 1.33* 1.14 1.13  CALCIUM 9.8 8.3* 9.0   GFR: Estimated Creatinine Clearance: 80.1 mL/min (by C-G formula based on SCr of 1.13 mg/dL). Liver Function Tests: Recent Labs  Lab 02/12/18 0306 02/13/18 0658 02/14/18 0842  AST 306* 61* 32  ALT 165* 96* 73*  ALKPHOS 77 67 65  BILITOT 1.0 0.7 0.9  PROT 6.8 5.8* 6.5  ALBUMIN 4.4 3.4* 4.1  Recent Labs  Lab 02/12/18 0306  LIPASE 36   No results for input(s): AMMONIA in the last 168 hours. Coagulation Profile: Recent Labs  Lab 02/13/18 0658  INR 1.06   Cardiac Enzymes: No results for input(s): CKTOTAL, CKMB, CKMBINDEX, TROPONINI in the last 168 hours. BNP (last 3 results) No results for input(s): PROBNP in the last 8760 hours. HbA1C: Recent Labs    02/12/18 0830  HGBA1C 5.8*   CBG: Recent Labs  Lab 02/12/18 1831 02/12/18 2126 02/13/18 0633 02/13/18 0751 02/13/18 1243  GLUCAP 77 141* 94 96 89   Lipid Profile: Recent Labs    02/12/18 0756  CHOL 107  HDL 40*  LDLCALC 40    TRIG 135  CHOLHDL 2.7   Thyroid Function Tests: No results for input(s): TSH, T4TOTAL, FREET4, T3FREE, THYROIDAB in the last 72 hours. Anemia Panel: No results for input(s): VITAMINB12, FOLATE, FERRITIN, TIBC, IRON, RETICCTPCT in the last 72 hours. Urine analysis:    Component Value Date/Time   COLORURINE YELLOW 02/12/2018 0310   APPEARANCEUR CLOUDY (A) 02/12/2018 0310   LABSPEC 1.008 02/12/2018 0310   PHURINE 8.0 02/12/2018 0310   GLUCOSEU NEGATIVE 02/12/2018 0310   HGBUR NEGATIVE 02/12/2018 0310   BILIRUBINUR NEGATIVE 02/12/2018 0310   KETONESUR NEGATIVE 02/12/2018 0310   PROTEINUR NEGATIVE 02/12/2018 0310   NITRITE NEGATIVE 02/12/2018 0310   LEUKOCYTESUR NEGATIVE 02/12/2018 0310   Sepsis Labs: Invalid input(s): PROCALCITONIN, LACTICIDVEN  No results found for this or any previous visit (from the past 240 hour(s)).    Radiology Studies: No results found.   Scheduled Meds: . aspirin EC  81 mg Oral Daily  . dicyclomine  20 mg Intramuscular Once  . iohexol  100 mL Intravenous Once  . rosuvastatin  20 mg Oral Daily   Continuous Infusions: . cefTRIAXone (ROCEPHIN)  IV 2 g (02/14/18 0036)    Marzetta Board, MD, PhD [facility] Pager [phone]  If 7PM-7AM, please contact night-coverage [email or web address] Password [email or web address] 02/14/2018, 12:15 PM

## 2018-02-14 NOTE — Progress Notes (Signed)
Primary contacted [place] about patient's aspirin. From Dr Elmarie Shiley note plan was to hold brillinta for surgery, recs were to continue aspirin if acceptable from a surgical standpoint. Would start aspirin 81mg  daily unless there is an absolute contraindication from a surgical standpoint.    Carlyle Dolly MD

## 2018-02-15 NOTE — Progress Notes (Signed)
Progress Note  Patient Name: Travis Jenkins Date of Encounter: 02/15/2018  Primary Cardiologist: Sanda Klein, MD   Subjective   No angina, no abdominal pain. P2Y12 121PRU yesterday.  Inpatient Medications    Scheduled Meds: . acetaminophen  1,000 mg Oral On Call to OR  . aspirin EC  81 mg Oral Daily  . celecoxib  200 mg Oral On Call to OR  . dicyclomine  20 mg Intramuscular Once  . gabapentin  300 mg Oral On Call to OR  . iohexol  100 mL Intravenous Once  . rosuvastatin  20 mg Oral Daily   Continuous Infusions: . cefTRIAXone (ROCEPHIN)  IV 2 g (02/15/18 0015)   PRN Meds: acetaminophen **OR** acetaminophen, bisacodyl, HYDROcodone-acetaminophen, morphine injection, ondansetron, senna-docusate, zolpidem   Vital Signs    Vitals:   02/14/18 0541 02/14/18 1554 02/14/18 2040 02/15/18 0603  BP: 130/73 138/69 125/75 125/72  Pulse: (!) 52 (!) 57 (!) 54   Resp: 17 18 20 20   Temp: 97.7 F (36.5 C) 98.2 F (36.8 C) 98.1 F (36.7 C) 97.9 F (36.6 C)  TempSrc: Oral Oral Oral Oral  SpO2: 96% 97% 98%   Weight:      Height:        Intake/Output Summary (Last 24 hours) at 02/15/2018 0932 Last data filed at 02/15/2018 0156 Gross per 24 hour  Intake 483.5 ml  Output -  Net 483.5 ml   Filed Weights   02/12/18 0301 02/12/18 0747  Weight: 210 lb (95.3 kg) 214 lb 8.1 oz (97.3 kg)    Telemetry    NSR - Personally Reviewed  ECG    No new tracing - Personally Reviewed  Physical Exam  Appears comfortable GEN: No acute distress.   Neck: No JVD Cardiac: RRR, no murmurs, rubs, or gallops.  Respiratory: Clear to auscultation bilaterally. GI: Soft, nontender, non-distended  MS: No edema; No deformity. Neuro:  Nonfocal  Psych: Normal affect   Labs    Chemistry Recent Labs  Lab 02/12/18 0306 02/13/18 0658 02/14/18 0842  NA 140 140 139  K 4.1 4.1 4.0  CL 102 106 104  CO2 29 24 27   GLUCOSE 146* 100* 99  BUN 10 9 11   CREATININE 1.33* 1.14 1.13  CALCIUM 9.8  8.3* 9.0  PROT 6.8 5.8* 6.5  ALBUMIN 4.4 3.4* 4.1  AST 306* 61* 32  ALT 165* 96* 73*  ALKPHOS 77 67 65  BILITOT 1.0 0.7 0.9  GFRNONAA 55* >60 >60  GFRAA >60 >60 >60  ANIONGAP 9 10 8      Hematology Recent Labs  Lab 02/12/18 0306 02/13/18 0658 02/14/18 0842  WBC 9.7 4.9 5.8  RBC 4.90 4.42 4.81  HGB 15.2 13.6 14.8  HCT 45.2 41.2 44.7  MCV 92.2 93.2 92.9  MCH 31.0 30.8 30.8  MCHC 33.6 33.0 33.1  RDW 12.0 12.2 12.0  PLT 168 156 159    Cardiac EnzymesNo results for input(s): TROPONINI in the last 168 hours.  Recent Labs  Lab 02/12/18 0313  TROPIPOC 0.00     BNPNo results for input(s): BNP, PROBNP in the last 168 hours.   DDimer No results for input(s): DDIMER in the last 168 hours.   Radiology    No results found.  Cardiac Studies  05/01/2017 CATH 1. Significant single-vessel coronary artery disease, with sequential 70% and 40% mid and distal LAD stenoses. FFR was hemodynamically significant at 0.76. 2. Normal left ventricular contraction. 3. Mildly elevated left ventricular filling pressure. 4. Successful  PCI to 70% mid LAD with placement of a Resolute Onyx 3.5 x 12 mm drug-eluting stent with 0% residual stenosis and TIMI-3 flow. Moderate-caliber D2 branch was jailed by the stent with resultant plaque shift and ostial stenosis. Following balloon angioplasty of D2, there is 70% residual stenosis at the ostium with TIMI-3 flow.  Recommendations: 1. Dual antiplatelet therapy with aspirin and ticagrelor for at least 12 months. 2. Medical management of residual ostial D2 disease; further intervention not attempted due to lack of chest pain and TIMI-3 flow. 3. Aggressive secondary prevention, including escalation of rosuvastatin to 20 mg daily. Fasting lipid panel ordered for tomorrow morning.  Patient Profile     67 y.o. male with LAD stent Sept 2019 on Brilinta, needs urgent (but not emergent) cholecystectomy.  Assessment & Plan    Last dose of Brilinta was  Thursday PM. Hopefully by tomorrow morning will have PRU>240 for surgery.  For questions or updates, please contact [place] Please consult [email or web address] for contact info under Cardiology/STEMI.      Signed, Sanda Klein, MD  02/15/2018, 9:32 AM

## 2018-02-15 NOTE — Progress Notes (Signed)
PROGRESS NOTE  Travis WITUCKI CHE:[id number] DOB: [birth year] DOA: 02/12/2018 PCP: Lujean Amel, MD   LOS: 3 days   Brief Narrative / Interim history: 67 year old male with history of CAD status post cath and stent September 2018, who was admitted to the hospital on 6/28 with right upper quadrant abdominal pain after eating fatty foods, imaging showed cholelithiasis, had mild LFT elevation.  General surgery was consulted and patient will be admitted with biliary colic and he is scheduled to get a cholecystectomy early next week.  He has been on Brilinta which is now on hold.  Cardiology consulted for preop evaluation  Assessment & Plan: Principal Problem:   Cholelithiasis Active Problems:   Bradycardia   CKD (chronic kidney disease) stage 2, GFR 60-89 ml/min   Hyperlipidemia   NSTEMI (non-ST elevated myocardial infarction) (HCC)   CAD (coronary artery disease)   Abdominal pain   IBS (irritable bowel syndrome)   Elevated glucose   Elevated serum creatinine   Preoperative cardiovascular examination   Abdominal pain in the setting of cholelithiasis with biliary colic -General surgery following, plan for cholecystectomy once P2Y12 levels are normal  Coronary artery disease -Status post stenting of proximal-mid LAD in September 2018, cardiology following, no further testing at this point from cardiac standpoint.  Hyperlipidemia -Continue statin  Elevated LFTs -Due to #1, LFTs continue to improve   DVT prophylaxis: SCDs Code Status: Full code Family Communication: no family at bedside Disposition Plan: home when ready   Consultants:   General surgery  Cardiology   Procedures:   None  Antimicrobials:  Ceftriaxone 6/28 >>   Subjective: - no complaints.  Objective: Vitals:   02/14/18 0541 02/14/18 1554 02/14/18 2040 02/15/18 0603  BP: 130/73 138/69 125/75 125/72  Pulse: (!) 52 (!) 57 (!) 54   Resp: 17 18 20 20   Temp: 97.7 F (36.5 C) 98.2 F (36.8 C) 98.1  F (36.7 C) 97.9 F (36.6 C)  TempSrc: Oral Oral Oral Oral  SpO2: 96% 97% 98%   Weight:      Height:        Intake/Output Summary (Last 24 hours) at 02/15/2018 1340 Last data filed at 02/15/2018 0900 Gross per 24 hour  Intake 843.5 ml  Output -  Net 843.5 ml   Filed Weights   02/12/18 0301 02/12/18 0747  Weight: 95.3 kg (210 lb) 97.3 kg (214 lb 8.1 oz)    Examination:  Constitutional: no distress Eyes: no scleral icterus   Data Reviewed: I have independently reviewed following labs and imaging studies   CBC: Recent Labs  Lab 02/12/18 0306 02/13/18 0658 02/14/18 0842  WBC 9.7 4.9 5.8  HGB 15.2 13.6 14.8  HCT 45.2 41.2 44.7  MCV 92.2 93.2 92.9  PLT 168 156 536   Basic Metabolic Panel: Recent Labs  Lab 02/12/18 0306 02/13/18 0658 02/14/18 0842  NA 140 140 139  K 4.1 4.1 4.0  CL 102 106 104  CO2 29 24 27   GLUCOSE 146* 100* 99  BUN 10 9 11   CREATININE 1.33* 1.14 1.13  CALCIUM 9.8 8.3* 9.0   GFR: Estimated Creatinine Clearance: 80.1 mL/min (by C-G formula based on SCr of 1.13 mg/dL). Liver Function Tests: Recent Labs  Lab 02/12/18 0306 02/13/18 0658 02/14/18 0842  AST 306* 61* 32  ALT 165* 96* 73*  ALKPHOS 77 67 65  BILITOT 1.0 0.7 0.9  PROT 6.8 5.8* 6.5  ALBUMIN 4.4 3.4* 4.1   Recent Labs  Lab 02/12/18 0306  LIPASE 36  No results for input(s): AMMONIA in the last 168 hours. Coagulation Profile: Recent Labs  Lab 02/13/18 0658  INR 1.06   Cardiac Enzymes: No results for input(s): CKTOTAL, CKMB, CKMBINDEX, TROPONINI in the last 168 hours. BNP (last 3 results) No results for input(s): PROBNP in the last 8760 hours. HbA1C: No results for input(s): HGBA1C in the last 72 hours. CBG: Recent Labs  Lab 02/12/18 1831 02/12/18 2126 02/13/18 0633 02/13/18 0751 02/13/18 1243  GLUCAP 77 141* 94 96 89   Lipid Profile: No results for input(s): CHOL, HDL, LDLCALC, TRIG, CHOLHDL, LDLDIRECT in the last 72 hours. Thyroid Function Tests: No  results for input(s): TSH, T4TOTAL, FREET4, T3FREE, THYROIDAB in the last 72 hours. Anemia Panel: No results for input(s): VITAMINB12, FOLATE, FERRITIN, TIBC, IRON, RETICCTPCT in the last 72 hours. Urine analysis:    Component Value Date/Time   COLORURINE YELLOW 02/12/2018 0310   APPEARANCEUR CLOUDY (A) 02/12/2018 0310   LABSPEC 1.008 02/12/2018 0310   PHURINE 8.0 02/12/2018 0310   GLUCOSEU NEGATIVE 02/12/2018 0310   HGBUR NEGATIVE 02/12/2018 0310   BILIRUBINUR NEGATIVE 02/12/2018 0310   KETONESUR NEGATIVE 02/12/2018 0310   PROTEINUR NEGATIVE 02/12/2018 0310   NITRITE NEGATIVE 02/12/2018 0310   LEUKOCYTESUR NEGATIVE 02/12/2018 0310   Sepsis Labs: Invalid input(s): PROCALCITONIN, LACTICIDVEN  No results found for this or any previous visit (from the past 240 hour(s)).    Radiology Studies: No results found.   Scheduled Meds: . acetaminophen  1,000 mg Oral On Call to OR  . aspirin EC  81 mg Oral Daily  . celecoxib  200 mg Oral On Call to OR  . dicyclomine  20 mg Intramuscular Once  . gabapentin  300 mg Oral On Call to OR  . iohexol  100 mL Intravenous Once  . rosuvastatin  20 mg Oral Daily   Continuous Infusions: . cefTRIAXone (ROCEPHIN)  IV 2 g (02/15/18 0015)    Marzetta Board, MD, PhD [facility] Pager [phone]  If 7PM-7AM, please contact night-coverage [email or web address] Password [email or web address] 02/15/2018, 1:40 PM

## 2018-02-15 NOTE — Progress Notes (Signed)
[facility] Progress Note     Subjective: CC-  No complaints this morning. Denies any abdominal pain, n/v. Tolerated diet yesterday. States that he has had no pain since Saturday. VSS On rocephin.  Objective: Vital signs in last 24 hours: Temp:  [97.9 F (36.6 C)-98.2 F (36.8 C)] 97.9 F (36.6 C) (07/01 0603) Pulse Rate:  [54-57] 54 (06/30 2040) Resp:  [18-20] 20 (07/01 0603) BP: (125-138)/(69-75) 125/72 (07/01 0603) SpO2:  [97 %-98 %] 98 % (06/30 2040) Last BM Date: 02/14/18  Intake/Output from previous day: 06/30 0701 - 07/01 0700 In: 483.5 [P.O.:50; IV Piggyback:433.5] Out: -  Intake/Output this shift: No intake/output data recorded.  PE: Gen:  Alert, NAD, pleasant HEENT: EOM's intact, pupils equal and round Card:  RRR, no M/G/R heard Pulm:  CTAB, no W/R/R, effort normal Abd: Soft, NT/ND, +BS, no HSM, no hernia Ext:  Calves soft and nontender Psych: A&Ox3  Skin: no rashes noted, warm and dry  Lab Results:  Recent Labs    02/13/18 0658 02/14/18 0842  WBC 4.9 5.8  HGB 13.6 14.8  HCT 41.2 44.7  PLT 156 159   BMET Recent Labs    02/13/18 0658 02/14/18 0842  NA 140 139  K 4.1 4.0  CL 106 104  CO2 24 27  GLUCOSE 100* 99  BUN 9 11  CREATININE 1.14 1.13  CALCIUM 8.3* 9.0   PT/INR Recent Labs    02/13/18 0658  LABPROT 13.7  INR 1.06   CMP     Component Value Date/Time   NA 139 02/14/2018 0842   K 4.0 02/14/2018 0842   CL 104 02/14/2018 0842   CO2 27 02/14/2018 0842   GLUCOSE 99 02/14/2018 0842   BUN 11 02/14/2018 0842   CREATININE 1.13 02/14/2018 0842   CALCIUM 9.0 02/14/2018 0842   PROT 6.5 02/14/2018 0842   ALBUMIN 4.1 02/14/2018 0842   AST 32 02/14/2018 0842   ALT 73 (H) 02/14/2018 0842   ALKPHOS 65 02/14/2018 0842   BILITOT 0.9 02/14/2018 0842   GFRNONAA >60 02/14/2018 0842   GFRAA >60 02/14/2018 0842   Lipase     Component Value Date/Time   LIPASE 36 02/12/2018 0306       Studies/Results: No results  found.  Anti-infectives: Anti-infectives (From admission, onward)   Start     Dose/Rate Route Frequency Ordered Stop   02/13/18 0000  cefTRIAXone (ROCEPHIN) 2 g in sodium chloride 0.9 % 100 mL IVPB     2 g 200 mL/hr over 30 Minutes Intravenous Every 24 hours 02/12/18 0709     02/12/18 0600  cefTRIAXone (ROCEPHIN) 2 g in sodium chloride 0.9 % 100 mL IVPB     2 g 200 mL/hr over 30 Minutes Intravenous  Once 02/12/18 0549 02/12/18 0648       Assessment/Plan CAD, s/p stenting mid LAD, 04/2017, on Brilinta -Brilinta on hold and cardiology following - last dose 6/27 at 1800 -Platelet inhibition p2y12: 121 >>Spoke with Dr. Sallyanne Kuster, when this lab is >/= 240 the Brillinta has worn off, therefore at 121 his platelets are still inhibited. Could repeat lab Wednesday, or operate knowing the risk factor of bleeding.   Gallstones with biliary colic versus low-grade cholecystitis - u/s shows Cholelithiasis, no secondary signs of acute cholecystitis - LFTs fairly normal  ID - rocephin 6/28>> FEN - HH diet VTE - SCDs Foley - none  Plan - Ok for diet today, NPO after midnight. Repeat labs (including platelet inhibition p2y12) tomorrow.  LOS: 3 days    Wellington [place] , [facility] Surgery 02/15/2018, 7:38 AM Pager: [phone] Consults: [phone] Mon 7:00 am -11:30 AM Tues-Fri 7:00 am-4:30 pm Sat-Sun 7:00 am-11:30 am

## 2018-02-16 DIAGNOSIS — R001 Bradycardia, unspecified: Secondary | ICD-10-CM

## 2018-02-16 LAB — COMPREHENSIVE METABOLIC PANEL
ALT: 40 U/L (ref 0–44)
AST: 20 U/L (ref 15–41)
Albumin: 3.9 g/dL (ref 3.5–5.0)
Alkaline Phosphatase: 54 U/L (ref 38–126)
Anion gap: 9 (ref 5–15)
BUN: 11 mg/dL (ref 8–23)
CO2: 26 mmol/L (ref 22–32)
Calcium: 9 mg/dL (ref 8.9–10.3)
Chloride: 104 mmol/L (ref 98–111)
Creatinine, Ser: 1.05 mg/dL (ref 0.61–1.24)
GFR calc Af Amer: >60 mL/min (ref >60)
GFR calc non Af Amer: >60 mL/min (ref >60)
Glucose, Bld: 100 mg/dL — ABNORMAL HIGH (ref 70–99)
Potassium: 4 mmol/L (ref 3.5–5.1)
Sodium: 139 mmol/L (ref 135–145)
Total Bilirubin: 0.7 mg/dL (ref 0.3–1.2)
Total Protein: 6.4 g/dL — ABNORMAL LOW (ref 6.5–8.1)

## 2018-02-16 LAB — CBC
HCT: 42.1 % (ref 39.0–52.0)
Hemoglobin: 14.1 g/dL (ref 13.0–17.0)
MCH: 30.7 pg (ref 26.0–34.0)
MCHC: 33.5 g/dL (ref 30.0–36.0)
MCV: 91.5 fL (ref 78.0–100.0)
Platelets: 181 10*3/uL (ref 150–400)
RBC: 4.6 MIL/uL (ref 4.22–5.81)
RDW: 12 % (ref 11.5–15.5)
WBC: 5.7 10*3/uL (ref 4.0–10.5)

## 2018-02-16 LAB — PLATELET INHIBITION P2Y12: Platelet Function  P2Y12: 179 [PRU] — ABNORMAL LOW (ref 194–418)

## 2018-02-16 LAB — SURGICAL PCR SCREEN
MRSA, PCR: NEGATIVE
Staphylococcus aureus: NEGATIVE

## 2018-02-16 NOTE — Progress Notes (Addendum)
Progress Note  Patient Name: Travis Jenkins Date of Encounter: 02/16/2018  Primary Cardiologist: Sanda Klein, MD   Subjective   Denies abdominal pain.  Complains of fatigue.  Daytime sleepiness.  No angina.  Inpatient Medications    Scheduled Meds: . aspirin EC  81 mg Oral Daily  . dicyclomine  20 mg Intramuscular Once  . iohexol  100 mL Intravenous Once  . rosuvastatin  20 mg Oral Daily   Continuous Infusions: . cefTRIAXone (ROCEPHIN)  IV Stopped (02/16/18 0138)   PRN Meds: acetaminophen **OR** acetaminophen, bisacodyl, HYDROcodone-acetaminophen, morphine injection, ondansetron, senna-docusate, zolpidem   Vital Signs    Vitals:   02/15/18 0603 02/15/18 1417 02/15/18 2203 02/16/18 0535  BP: 125/72 124/77 134/74 122/74  Pulse:  (!) 59 (!) 48 (!) 48  Resp: 20 18  18   Temp: 97.9 F (36.6 C) 97.9 F (36.6 C) 97.8 F (36.6 C) 97.7 F (36.5 C)  TempSrc: Oral Oral Oral Oral  SpO2:  98% 96% 95%  Weight:      Height:        Intake/Output Summary (Last 24 hours) at 02/16/2018 0936 Last data filed at 02/15/2018 1700 Gross per 24 hour  Intake 480 ml  Output -  Net 480 ml   Filed Weights   02/12/18 0301 02/12/18 0747  Weight: 210 lb (95.3 kg) 214 lb 8.1 oz (97.3 kg)    Telemetry    Sinus bradycardia, 40s- Personally Reviewed  ECG    No new tracing- Personally Reviewed  Physical Exam  Looks comfortable GEN: No acute distress.   Neck: No JVD Cardiac: RRR, no murmurs, rubs, or gallops.  Respiratory: Clear to auscultation bilaterally. GI: Soft, nontender, non-distended  MS: No edema; No deformity. Neuro:  Nonfocal  Psych: Normal affect   Labs    Chemistry Recent Labs  Lab 02/13/18 0658 02/14/18 0842 02/16/18 0528  NA 140 139 139  K 4.1 4.0 4.0  CL 106 104 104  CO2 24 27 26   GLUCOSE 100* 99 100*  BUN 9 11 11   CREATININE 1.14 1.13 1.05  CALCIUM 8.3* 9.0 9.0  PROT 5.8* 6.5 6.4*  ALBUMIN 3.4* 4.1 3.9  AST 61* 32 20  ALT 96* 73* 40  ALKPHOS  67 65 54  BILITOT 0.7 0.9 0.7  GFRNONAA >60 >60 >60  GFRAA >60 >60 >60  ANIONGAP 10 8 9      Hematology Recent Labs  Lab 02/13/18 0658 02/14/18 0842 02/16/18 0528  WBC 4.9 5.8 5.7  RBC 4.42 4.81 4.60  HGB 13.6 14.8 14.1  HCT 41.2 44.7 42.1  MCV 93.2 92.9 91.5  MCH 30.8 30.8 30.7  MCHC 33.0 33.1 33.5  RDW 12.2 12.0 12.0  PLT 156 159 181    Cardiac EnzymesNo results for input(s): TROPONINI in the last 168 hours.  Recent Labs  Lab 02/12/18 0313  TROPIPOC 0.00     BNPNo results for input(s): BNP, PROBNP in the last 168 hours.   DDimer No results for input(s): DDIMER in the last 168 hours.   Radiology    No results found.  Cardiac Studies   05/01/2017 CATH 1. Significant single-vessel coronary artery disease, with sequential 70% and 40% mid and distal LAD stenoses. FFR was hemodynamically significant at 0.76. 2. Normal left ventricular contraction. 3. Mildly elevated left ventricular filling pressure. 4. Successful PCI to 70% mid LAD with placement of a Resolute Onyx 3.5 x 12 mm drug-eluting stent with 0% residual stenosis and TIMI-3 flow. Moderate-caliber D2 branch was  jailed by the stent with resultant plaque shift and ostial stenosis. Following balloon angioplasty of D2, there is 70% residual stenosis at the ostium with TIMI-3 flow.  Recommendations: 1. Dual antiplatelet therapy with aspirin and ticagrelor for at least 12 months. 2. Medical management of residual ostial D2 disease; further intervention not attempted due to lack of chest pain and TIMI-3 flow. 3. Aggressive secondary prevention, including escalation of rosuvastatin to 20 mg daily. Fasting lipid panel ordered for tomorrow morning.    Patient Profile     67 y.o. male with LAD stent Sept 2019 on Brilinta, needs urgent (but not emergent) cholecystectomy.  Assessment & Plan  1. CAD: Asymptomatic.  Plan to discontinue Brilinta permanently in September. 2. Possible OSA: No symptoms of active cardiac  disease, but he does describe long-standing issues with fatigue and daytime sleepiness. will schedule OP sleep study. 3.  Sinus bradycardia: he has sinus bradycardia both while awake and when asleep.  He does snore, but has not had witnessed apnea.  Does have some daytime hypersomnolence, but I do not think really meeting criteria for sleep apnea. When he recovers from his cholecystectomy we will plan to schedule an outpatient sleep study. It is possible that he will require pacemaker for sinus node dysfunction with symptomatic bradycardia.  It is not an urgent procedure. 4.  Cholelithiasis: For cholecystectomy on this admission.  PRU has been gradually increasing, with expected to be at an acceptably high level for low risk of surgical bleeding by tomorrow.     For questions or updates, please contact [place] Please consult [email or web address] for contact info under Cardiology/STEMI.      Signed, Sanda Klein, MD  02/16/2018, 9:36 AM

## 2018-02-16 NOTE — Progress Notes (Signed)
Telemetry called stating pt HR dropped to the low 30's. I assessed pt who was sleeping and checked the monitor and his heart rate was 48 at that time. Will continue to closely monitor the patient

## 2018-02-16 NOTE — Progress Notes (Signed)
PROGRESS NOTE  Travis Jenkins YQM:[id number] DOB: 10-12-1950 DOA: 02/12/2018 PCP: Lujean Amel, MD   LOS: 4 days   Brief Narrative / Interim history: 67 year old male with history of CAD status post cath and stent September 2018, who was admitted to the hospital on 6/28 with right upper quadrant abdominal pain after eating fatty foods, imaging showed cholelithiasis, had mild LFT elevation.  General surgery was consulted and patient will be admitted with biliary colic and he is scheduled to get a cholecystectomy early next week.  He has been on Brilinta which is now on hold.  Cardiology consulted for preop evaluation.  Hopefully will get surgery on 7/3  Assessment & Plan: Principal Problem:   Cholelithiasis Active Problems:   Bradycardia   CKD (chronic kidney disease) stage 2, GFR 60-89 ml/min   Hyperlipidemia   NSTEMI (non-ST elevated myocardial infarction) (HCC)   CAD (coronary artery disease)   Abdominal pain   IBS (irritable bowel syndrome)   Elevated glucose   Elevated serum creatinine   Preoperative cardiovascular examination   Abdominal pain in the setting of cholelithiasis with biliary colic -General surgery following, plan for cholecystectomy once P2Y12 levels are normal, hopefully on Wednesday  Coronary artery disease -Status post stenting of proximal-mid LAD in September 2018, cardiology following, no further testing at this point from cardiac standpoint.   Hyperlipidemia -Continue statin  Elevated LFTs -Due to #1, LFTs continue to improve   DVT prophylaxis: SCDs Code Status: Full code Family Communication: no family at bedside Disposition Plan: home when ready   Consultants:   General surgery  Cardiology   Procedures:   None  Antimicrobials:  Ceftriaxone 6/28 >>   Subjective: -No complaints.  Objective: Vitals:   02/15/18 0603 02/15/18 1417 02/15/18 2203 02/16/18 0535  BP: 125/72 124/77 134/74 122/74  Pulse:  (!) 59 (!) 48 (!) 48  Resp:  20 18  18   Temp: 97.9 F (36.6 C) 97.9 F (36.6 C) 97.8 F (36.6 C) 97.7 F (36.5 C)  TempSrc: Oral Oral Oral Oral  SpO2:  98% 96% 95%  Weight:      Height:        Intake/Output Summary (Last 24 hours) at 02/16/2018 1109 Last data filed at 02/15/2018 1700 Gross per 24 hour  Intake 480 ml  Output -  Net 480 ml   Filed Weights   02/12/18 0301 02/12/18 0747  Weight: 95.3 kg (210 lb) 97.3 kg (214 lb 8.1 oz)    Examination:  Constitutional: NAD, Respiratory: CTA  Cardiovascular: RRR  Data Reviewed: I have independently reviewed following labs and imaging studies   CBC: Recent Labs  Lab 02/12/18 0306 02/13/18 0658 02/14/18 0842 02/16/18 0528  WBC 9.7 4.9 5.8 5.7  HGB 15.2 13.6 14.8 14.1  HCT 45.2 41.2 44.7 42.1  MCV 92.2 93.2 92.9 91.5  PLT 168 156 159 889   Basic Metabolic Panel: Recent Labs  Lab 02/12/18 0306 02/13/18 0658 02/14/18 0842 02/16/18 0528  NA 140 140 139 139  K 4.1 4.1 4.0 4.0  CL 102 106 104 104  CO2 29 24 27 26   GLUCOSE 146* 100* 99 100*  BUN 10 9 11 11   CREATININE 1.33* 1.14 1.13 1.05  CALCIUM 9.8 8.3* 9.0 9.0   GFR: Estimated Creatinine Clearance: 86.2 mL/min (by C-G formula based on SCr of 1.05 mg/dL). Liver Function Tests: Recent Labs  Lab 02/12/18 0306 02/13/18 0658 02/14/18 0842 02/16/18 0528  AST 306* 61* 32 20  ALT 165* 96* 73* 40  ALKPHOS 77 67 65 54  BILITOT 1.0 0.7 0.9 0.7  PROT 6.8 5.8* 6.5 6.4*  ALBUMIN 4.4 3.4* 4.1 3.9   Recent Labs  Lab 02/12/18 0306  LIPASE 36   No results for input(s): AMMONIA in the last 168 hours. Coagulation Profile: Recent Labs  Lab 02/13/18 0658  INR 1.06   Cardiac Enzymes: No results for input(s): CKTOTAL, CKMB, CKMBINDEX, TROPONINI in the last 168 hours. BNP (last 3 results) No results for input(s): PROBNP in the last 8760 hours. HbA1C: No results for input(s): HGBA1C in the last 72 hours. CBG: Recent Labs  Lab 02/12/18 1831 02/12/18 2126 02/13/18 0633 02/13/18 0751  02/13/18 1243  GLUCAP 77 141* 94 96 89   Lipid Profile: No results for input(s): CHOL, HDL, LDLCALC, TRIG, CHOLHDL, LDLDIRECT in the last 72 hours. Thyroid Function Tests: No results for input(s): TSH, T4TOTAL, FREET4, T3FREE, THYROIDAB in the last 72 hours. Anemia Panel: No results for input(s): VITAMINB12, FOLATE, FERRITIN, TIBC, IRON, RETICCTPCT in the last 72 hours. Urine analysis:    Component Value Date/Time   COLORURINE YELLOW 02/12/2018 0310   APPEARANCEUR CLOUDY (A) 02/12/2018 0310   LABSPEC 1.008 02/12/2018 0310   PHURINE 8.0 02/12/2018 0310   GLUCOSEU NEGATIVE 02/12/2018 0310   HGBUR NEGATIVE 02/12/2018 0310   BILIRUBINUR NEGATIVE 02/12/2018 0310   KETONESUR NEGATIVE 02/12/2018 0310   PROTEINUR NEGATIVE 02/12/2018 0310   NITRITE NEGATIVE 02/12/2018 0310   LEUKOCYTESUR NEGATIVE 02/12/2018 0310   Sepsis Labs: Invalid input(s): PROCALCITONIN, LACTICIDVEN  Recent Results (from the past 240 hour(s))  Surgical pcr screen     Status: None   Collection Time: 02/15/18  9:29 PM  Result Value Ref Range Status   MRSA, PCR NEGATIVE NEGATIVE Final   Staphylococcus aureus NEGATIVE NEGATIVE Final    Comment: (NOTE) The Xpert SA Assay (FDA approved for NASAL specimens in patients 76 years of age and older), is one component of a comprehensive surveillance program. It is not intended to diagnose infection nor to guide or monitor treatment. Performed at [facility], [place] [address] [place], [place] [address]       Radiology Studies: No results found.   Scheduled Meds: . aspirin EC  81 mg Oral Daily  . dicyclomine  20 mg Intramuscular Once  . iohexol  100 mL Intravenous Once  . rosuvastatin  20 mg Oral Daily   Continuous Infusions: . cefTRIAXone (ROCEPHIN)  IV Stopped (02/16/18 0138)    Marzetta Board, MD, PhD [facility] Pager [phone]  If 7PM-7AM, please contact night-coverage [email or web address] Password [email or web address] 02/16/2018, 11:09 AM

## 2018-02-16 NOTE — Progress Notes (Addendum)
[facility] Progress Note     Subjective: CC-  Sitting up in chair, wife in room. Denies any abdominal pain. Tolerated diet yesterday without increased pain or n/v.  Platelet inhibition p2y12 up to 179.  Objective: Vital signs in last 24 hours: Temp:  [97.7 F (36.5 C)-97.9 F (36.6 C)] 97.7 F (36.5 C) (07/02 0535) Pulse Rate:  [48-59] 48 (07/02 0535) Resp:  [18] 18 (07/02 0535) BP: (122-134)/(74-77) 122/74 (07/02 0535) SpO2:  [95 %-98 %] 95 % (07/02 0535) Last BM Date: 02/15/18  Intake/Output from previous day: 07/01 0701 - 07/02 0700 In: 840 [P.O.:840] Out: -  Intake/Output this shift: No intake/output data recorded.  PE: Gen:  Alert, NAD, pleasant HEENT: EOM's intact, pupils equal and round Card:  RRR, no M/G/R heard Pulm:  CTAB, no W/R/R, effort normal Abd: Soft, NT/ND, +BS, no HSM, no hernia Ext:  Calves soft and nontender Psych: A&Ox3  Skin: no rashes noted, warm and dry  Lab Results:  Recent Labs    02/14/18 0842 02/16/18 0528  WBC 5.8 5.7  HGB 14.8 14.1  HCT 44.7 42.1  PLT 159 181   BMET Recent Labs    02/14/18 0842 02/16/18 0528  NA 139 139  K 4.0 4.0  CL 104 104  CO2 27 26  GLUCOSE 99 100*  BUN 11 11  CREATININE 1.13 1.05  CALCIUM 9.0 9.0   PT/INR No results for input(s): LABPROT, INR in the last 72 hours. CMP     Component Value Date/Time   NA 139 02/16/2018 0528   K 4.0 02/16/2018 0528   CL 104 02/16/2018 0528   CO2 26 02/16/2018 0528   GLUCOSE 100 (H) 02/16/2018 0528   BUN 11 02/16/2018 0528   CREATININE 1.05 02/16/2018 0528   CALCIUM 9.0 02/16/2018 0528   PROT 6.4 (L) 02/16/2018 0528   ALBUMIN 3.9 02/16/2018 0528   AST 20 02/16/2018 0528   ALT 40 02/16/2018 0528   ALKPHOS 54 02/16/2018 0528   BILITOT 0.7 02/16/2018 0528   GFRNONAA >60 02/16/2018 0528   GFRAA >60 02/16/2018 0528   Lipase     Component Value Date/Time   LIPASE 36 02/12/2018 0306       Studies/Results: No results  found.  Anti-infectives: Anti-infectives (From admission, onward)   Start     Dose/Rate Route Frequency Ordered Stop   02/13/18 0000  cefTRIAXone (ROCEPHIN) 2 g in sodium chloride 0.9 % 100 mL IVPB     2 g 200 mL/hr over 30 Minutes Intravenous Every 24 hours 02/12/18 0709     02/12/18 0600  cefTRIAXone (ROCEPHIN) 2 g in sodium chloride 0.9 % 100 mL IVPB     2 g 200 mL/hr over 30 Minutes Intravenous  Once 02/12/18 0549 02/12/18 0648       Assessment/Plan CAD, s/p stenting mid LAD, 04/2017, on Brilinta -Brilinta on hold and cardiology following - last dose 6/27 at 1800 -Platelet inhibition p2y12: 179 >>Spoke with Dr. Sallyanne Kuster, when this lab is >/= 240 the Brillinta has worn off  Gallstones with biliary colic versus low-grade cholecystitis - u/s shows Cholelithiasis, no secondary signs of acute cholecystitis - LFTs normal  ID - rocephin 6/28>> FEN - IVF, HH diet, NPO after MN VTE - SCDs Foley - none  Plan - Platelet inhibition p2y12 up to 179 today, but still too low to proceed with surgery today. [place] for diet today, NPO after midnight.   LOS: 4 days    Wellington Hampshire , PA-C  [facility] 02/16/2018, 8:40 AM Pager: [phone] Consults: [phone] Mon 7:00 am -11:30 AM Tues-Fri 7:00 am-4:30 pm Sat-Sun 7:00 am-11:30 am

## 2018-02-17 ENCOUNTER — Encounter (HOSPITAL_COMMUNITY)
Admission: EM | Disposition: A | Discharge status: Home / Self Care | Payer: Self-pay | Source: Home / Self Care | Attending: Internal Medicine

## 2018-02-17 ENCOUNTER — Inpatient Hospital Stay (HOSPITAL_COMMUNITY): Payer: BLUE CROSS/BLUE SHIELD | Admitting: Certified Registered"

## 2018-02-17 ENCOUNTER — Encounter (HOSPITAL_COMMUNITY): Payer: Self-pay | Admitting: Certified Registered"

## 2018-02-17 HISTORY — PX: CHOLECYSTECTOMY: SHX55

## 2018-02-17 LAB — COMPREHENSIVE METABOLIC PANEL
ALT: 36 U/L (ref 0–44)
AST: 20 U/L (ref 15–41)
Albumin: 4.3 g/dL (ref 3.5–5.0)
Alkaline Phosphatase: 58 U/L (ref 38–126)
Anion gap: 8 (ref 5–15)
BUN: 15 mg/dL (ref 8–23)
CO2: 27 mmol/L (ref 22–32)
Calcium: 9.1 mg/dL (ref 8.9–10.3)
Chloride: 104 mmol/L (ref 98–111)
Creatinine, Ser: 1.17 mg/dL (ref 0.61–1.24)
GFR calc Af Amer: >60 mL/min (ref >60)
GFR calc non Af Amer: >60 mL/min (ref >60)
Glucose, Bld: 100 mg/dL — ABNORMAL HIGH (ref 70–99)
Potassium: 4.1 mmol/L (ref 3.5–5.1)
Sodium: 139 mmol/L (ref 135–145)
Total Bilirubin: 0.7 mg/dL (ref 0.3–1.2)
Total Protein: 7 g/dL (ref 6.5–8.1)

## 2018-02-17 LAB — CBC
HCT: 46.3 % (ref 39.0–52.0)
Hemoglobin: 15.5 g/dL (ref 13.0–17.0)
MCH: 30.8 pg (ref 26.0–34.0)
MCHC: 33.5 g/dL (ref 30.0–36.0)
MCV: 92 fL (ref 78.0–100.0)
Platelets: 158 10*3/uL (ref 150–400)
RBC: 5.03 MIL/uL (ref 4.22–5.81)
RDW: 12 % (ref 11.5–15.5)
WBC: 5.4 10*3/uL (ref 4.0–10.5)

## 2018-02-17 LAB — PLATELET INHIBITION P2Y12: Platelet Function  P2Y12: 142 [PRU] — ABNORMAL LOW (ref 194–418)

## 2018-02-17 SURGERY — LAPAROSCOPIC CHOLECYSTECTOMY
Anesthesia: General | Site: Abdomen

## 2018-02-17 MED ORDER — LIDOCAINE 2% (20 MG/ML) 5 ML SYRINGE
INTRAMUSCULAR | Status: AC
  Filled 2018-02-17: qty 5

## 2018-02-17 MED ORDER — ROCURONIUM BROMIDE 10 MG/ML (PF) SYRINGE
PREFILLED_SYRINGE | INTRAVENOUS | Status: DC | PRN
  Administered 2018-02-17: 10 mg via INTRAVENOUS
  Administered 2018-02-17: 50 mg via INTRAVENOUS

## 2018-02-17 MED ORDER — SODIUM CHLORIDE 0.9 % IV SOLN
INTRAVENOUS | Status: DC
  Administered 2018-02-17: 14:00:00 via INTRAVENOUS

## 2018-02-17 MED ORDER — FENTANYL CITRATE (PF) 100 MCG/2ML IJ SOLN
INTRAMUSCULAR | Status: DC | PRN
  Administered 2018-02-17: 100 ug via INTRAVENOUS
  Administered 2018-02-17 (×3): 50 ug via INTRAVENOUS

## 2018-02-17 MED ORDER — ONDANSETRON HCL 4 MG/2ML IJ SOLN
INTRAMUSCULAR | Status: DC | PRN
  Administered 2018-02-17: 4 mg via INTRAVENOUS

## 2018-02-17 MED ORDER — FENTANYL CITRATE (PF) 250 MCG/5ML IJ SOLN
INTRAMUSCULAR | Status: AC
  Filled 2018-02-17: qty 5

## 2018-02-17 MED ORDER — MIDAZOLAM HCL 2 MG/2ML IJ SOLN
INTRAMUSCULAR | Status: AC
  Filled 2018-02-17: qty 2

## 2018-02-17 MED ORDER — SUGAMMADEX SODIUM 200 MG/2ML IV SOLN
INTRAVENOUS | Status: DC | PRN
  Administered 2018-02-17: 200 mg via INTRAVENOUS

## 2018-02-17 MED ORDER — BUPIVACAINE-EPINEPHRINE 0.25% -1:200000 IJ SOLN
INTRAMUSCULAR | Status: DC | PRN
  Administered 2018-02-17: 10 mL

## 2018-02-17 MED ORDER — PROPOFOL 10 MG/ML IV BOLUS
INTRAVENOUS | Status: AC
  Filled 2018-02-17: qty 20

## 2018-02-17 MED ORDER — SODIUM CHLORIDE 0.9 % IJ SOLN
INTRAMUSCULAR | Status: AC
  Filled 2018-02-17: qty 10

## 2018-02-17 MED ORDER — DEXAMETHASONE SODIUM PHOSPHATE 10 MG/ML IJ SOLN
INTRAMUSCULAR | Status: AC
Start: 2018-02-17 — End: ?
  Filled 2018-02-17: qty 1

## 2018-02-17 MED ORDER — CEFAZOLIN SODIUM-DEXTROSE 2-4 GM/100ML-% IV SOLN
2.0000 g | INTRAVENOUS | Status: AC
  Administered 2018-02-17: 2 g via INTRAVENOUS
  Filled 2018-02-17 (×2): qty 100

## 2018-02-17 MED ORDER — LIDOCAINE 2% (20 MG/ML) 5 ML SYRINGE
INTRAMUSCULAR | Status: DC | PRN
  Administered 2018-02-17: 20 mg via INTRAVENOUS

## 2018-02-17 MED ORDER — PROPOFOL 10 MG/ML IV BOLUS
INTRAVENOUS | Status: DC | PRN
  Administered 2018-02-17: 150 mg via INTRAVENOUS
  Administered 2018-02-17: 20 mg via INTRAVENOUS

## 2018-02-17 MED ORDER — FENTANYL CITRATE (PF) 100 MCG/2ML IJ SOLN
INTRAMUSCULAR | Status: AC
  Filled 2018-02-17: qty 2

## 2018-02-17 MED ORDER — SUGAMMADEX SODIUM 200 MG/2ML IV SOLN
INTRAVENOUS | Status: AC
  Filled 2018-02-17: qty 2

## 2018-02-17 MED ORDER — EPHEDRINE SULFATE-NACL 50-0.9 MG/10ML-% IV SOSY
PREFILLED_SYRINGE | INTRAVENOUS | Status: DC | PRN
  Administered 2018-02-17: 10 mg via INTRAVENOUS

## 2018-02-17 MED ORDER — GLYCOPYRROLATE PF 0.2 MG/ML IJ SOSY
PREFILLED_SYRINGE | INTRAMUSCULAR | Status: AC
  Filled 2018-02-17: qty 1

## 2018-02-17 MED ORDER — BUPIVACAINE-EPINEPHRINE (PF) 0.25% -1:200000 IJ SOLN
INTRAMUSCULAR | Status: AC
  Filled 2018-02-17: qty 30

## 2018-02-17 MED ORDER — FENTANYL CITRATE (PF) 100 MCG/2ML IJ SOLN
25.0000 ug | INTRAMUSCULAR | Status: DC | PRN
  Administered 2018-02-17: 50 ug via INTRAVENOUS

## 2018-02-17 MED ORDER — SODIUM CHLORIDE 0.9 % IR SOLN
Status: DC | PRN
  Administered 2018-02-17: 1

## 2018-02-17 MED ORDER — ROCURONIUM BROMIDE 10 MG/ML (PF) SYRINGE
PREFILLED_SYRINGE | INTRAVENOUS | Status: AC
  Filled 2018-02-17: qty 10

## 2018-02-17 MED ORDER — GLYCOPYRROLATE PF 0.2 MG/ML IJ SOSY
PREFILLED_SYRINGE | INTRAMUSCULAR | Status: DC | PRN
  Administered 2018-02-17: .2 mg via INTRAVENOUS

## 2018-02-17 MED ORDER — ONDANSETRON HCL 4 MG/2ML IJ SOLN
INTRAMUSCULAR | Status: AC
  Filled 2018-02-17: qty 2

## 2018-02-17 MED ORDER — DEXAMETHASONE SODIUM PHOSPHATE 10 MG/ML IJ SOLN
INTRAMUSCULAR | Status: DC | PRN
  Administered 2018-02-17: 10 mg via INTRAVENOUS

## 2018-02-17 MED ORDER — LACTATED RINGERS IV SOLN
INTRAVENOUS | Status: DC
  Administered 2018-02-17: 50 mL/h via INTRAVENOUS

## 2018-02-17 MED ORDER — 0.9 % SODIUM CHLORIDE (POUR BTL) OPTIME
TOPICAL | Status: DC | PRN
  Administered 2018-02-17: 1000 mL

## 2018-02-17 MED ORDER — EPHEDRINE SULFATE 50 MG/ML IJ SOLN
INTRAMUSCULAR | Status: AC
  Filled 2018-02-17: qty 1

## 2018-02-17 MED ORDER — ONDANSETRON HCL 4 MG/2ML IJ SOLN: 4.0000 mg | Freq: Once | INTRAMUSCULAR | Status: DC | PRN

## 2018-02-17 MED ORDER — MIDAZOLAM HCL 2 MG/2ML IJ SOLN
INTRAMUSCULAR | Status: DC | PRN
  Administered 2018-02-17: 2 mg via INTRAVENOUS

## 2018-02-17 SURGICAL SUPPLY — 38 items
APPLIER CLIP 5 13 M/L LIGAMAX5 (MISCELLANEOUS) ×2
BLADE CLIPPER SURG (BLADE) ×2 IMPLANT
CANISTER SUCT 3000ML PPV (MISCELLANEOUS) ×2 IMPLANT
CHLORAPREP W/TINT 26ML (MISCELLANEOUS) ×2 IMPLANT
CLIP APPLIE 5 13 M/L LIGAMAX5 (MISCELLANEOUS) ×1 IMPLANT
COVER SURGICAL LIGHT HANDLE (MISCELLANEOUS) ×2 IMPLANT
DERMABOND ADVANCED (GAUZE/BANDAGES/DRESSINGS) ×1
DERMABOND ADVANCED .7 DNX12 (GAUZE/BANDAGES/DRESSINGS) ×1 IMPLANT
ELECT REM PT RETURN 9FT ADLT (ELECTROSURGICAL) ×2
ELECTRODE REM PT RTRN 9FT ADLT (ELECTROSURGICAL) ×1 IMPLANT
GLOVE BIO SURGEON STRL SZ7 (GLOVE) ×6 IMPLANT
GLOVE BIOGEL PI IND STRL 7.5 (GLOVE) ×1 IMPLANT
GLOVE BIOGEL PI IND STRL 8 (GLOVE) ×1 IMPLANT
GLOVE BIOGEL PI INDICATOR 7.5 (GLOVE) ×1
GLOVE BIOGEL PI INDICATOR 8 (GLOVE) ×1
GLOVE SURG SS PI 8.0 STRL IVOR (GLOVE) ×2 IMPLANT
GOWN STRL REUS W/ TWL LRG LVL3 (GOWN DISPOSABLE) ×3 IMPLANT
GOWN STRL REUS W/TWL LRG LVL3 (GOWN DISPOSABLE) ×3
GRASPER SUT TROCAR 14GX15 (MISCELLANEOUS) ×2 IMPLANT
KIT BASIN OR (CUSTOM PROCEDURE TRAY) ×2 IMPLANT
KIT TURNOVER KIT B (KITS) ×2 IMPLANT
NS IRRIG 1000ML POUR BTL (IV SOLUTION) ×2 IMPLANT
PAD ARMBOARD 7.5X6 YLW CONV (MISCELLANEOUS) ×2 IMPLANT
POUCH RETRIEVAL ECOSAC 10 (ENDOMECHANICALS) ×1 IMPLANT
POUCH RETRIEVAL ECOSAC 10MM (ENDOMECHANICALS) ×1
SCISSORS LAP 5X35 DISP (ENDOMECHANICALS) ×2 IMPLANT
SET IRRIG TUBING LAPAROSCOPIC (IRRIGATION / IRRIGATOR) ×2 IMPLANT
SLEEVE ENDOPATH XCEL 5M (ENDOMECHANICALS) ×4 IMPLANT
SPECIMEN JAR SMALL (MISCELLANEOUS) ×2 IMPLANT
STRIP CLOSURE SKIN 1/2X4 (GAUZE/BANDAGES/DRESSINGS) ×2 IMPLANT
SUT MNCRL AB 4-0 PS2 18 (SUTURE) ×2 IMPLANT
SUT VICRYL 0 UR6 27IN ABS (SUTURE) ×2 IMPLANT
TOWEL GREEN STERILE FF (TOWEL DISPOSABLE) ×2 IMPLANT
TRAY LAPAROSCOPIC MC (CUSTOM PROCEDURE TRAY) ×2 IMPLANT
TROCAR XCEL BLUNT TIP 100MML (ENDOMECHANICALS) ×2 IMPLANT
TROCAR XCEL NON-BLD 5MMX100MML (ENDOMECHANICALS) ×2 IMPLANT
TUBING INSUFFLATION (TUBING) ×2 IMPLANT
WATER STERILE IRR 1000ML POUR (IV SOLUTION) ×2 IMPLANT

## 2018-02-17 NOTE — Transfer of Care (Signed)
Immediate Anesthesia Transfer of Care Note  Patient: Travis Jenkins  Procedure(s) Performed: LAPAROSCOPIC CHOLECYSTECTOMY (N/A Abdomen)  Patient Location: PACU  Anesthesia Type:General  Level of Consciousness: awake and patient cooperative  Airway & Oxygen Therapy: Patient Spontanous Breathing  Post-op Assessment: Report given to RN  Post vital signs: Reviewed and stable  Last Vitals:  Vitals Value Taken Time  BP    Temp    Pulse 77 02/17/2018  1:18 PM  Resp 12 02/17/2018  1:18 PM  SpO2 93 % 02/17/2018  1:18 PM  Vitals shown include unvalidated device data.  Last Pain:  Vitals:   02/17/18 0747  TempSrc:   PainSc: 0-No pain         Complications: No apparent anesthesia complications

## 2018-02-17 NOTE — Op Note (Signed)
Preoperative diagnosis:acute cholecystitis Postoperative diagnosis:acute cholecystitis Procedure: Laparoscopiccholecystectomy Surgeon: Dr. Serita Grammes Asst:Brooke Meuth, PA-C Anesthesia: Gen. Specimens: gb to pathology Estimated blood JQBH:41 cc Complications: None Drains: none Sponge count was correct at completion Disposition to recovery stable  Indications: This is a68 yom with biliary colic and concern for cholecystitis.  His brilinta has been off for days and we discussed proceeding with laparoscopic cholecystectomy.    Procedure: After informed consent was obtained the patient was taken to the operating room.He was already on antibiotics regimen. SCDs were in place.He was placed undergeneral anesthesia without complication. Hisabdomen was prepped and draped in the standard sterile surgical fashion. A surgical timeout was then performed.  I infiltrated marcaine below the umbilicus.I then made a vertical incision. I incised the fascia and entered the peritoneum bluntly. I placed a 0 vicryl pursestring suture. I inserted a hasson trocar and insufflated the abdomen to 15 mm Hg pressure. An additionalruq, right mid abdomenand epigastric trocar were both placed under direct vision (5 mm). The gallbladder had evidence of cholecystitis.There were some adhesions to the omentum I took down. I sharply took the duodenum down. I grasped the gallbladder and retracted it cephalad and lateral. I was able to identify the critical view of safety.  I was able to identify the cystic artery clearly. I clipped and divided the cystic artery to get this out of the way. I was able to clip the cystic duct leaving two clips in place. The duct was viable and the clips traversed the duct.I placed the gallbladderin a bag and removed it from the umbilicus.  I obtained hemostasis. I removed the hasson trocar and tied the pursestring down. I placed two additional 0 vicryl sutures with the  suture passer.   I then removed the remaining trocars and these were closed with 4-0 Monocryl and glue. I closed this with 4-0 monocryl and glue also.He tolerated this well be transferred to the recovery room

## 2018-02-17 NOTE — Discharge Instructions (Addendum)
Please arrive at least 30 min before your appointment to complete your check in paperwork.  If you are unable to arrive 30 min prior to your appointment time we may have to cancel or reschedule you. ° °LAPAROSCOPIC SURGERY: POST OP INSTRUCTIONS  °1. DIET: Follow a light bland diet the first 24 hours after arrival home, such as soup, liquids, crackers, etc. Be sure to include lots of fluids daily. Avoid fast food or heavy meals as your are more likely to get nauseated. Eat a low fat the next few days after surgery.  °2. Take your usually prescribed home medications unless otherwise directed. °3. PAIN CONTROL:  °1. Pain is best controlled by a usual combination of three different methods TOGETHER:  °1. Ice/Heat °2. Over the counter pain medication °3. Prescription pain medication °2. Most patients will experience some swelling and bruising around the incisions. Ice packs or heating pads (30-60 minutes up to 6 times a day) will help. Use ice for the first few days to help decrease swelling and bruising, then switch to heat to help relax tight/sore spots and speed recovery. Some people prefer to use ice alone, heat alone, alternating between ice & heat. Experiment to what works for you. Swelling and bruising can take several weeks to resolve.  °3. It is helpful to take an over-the-counter pain medication regularly for the first few weeks. Choose one of the following that works best for you:  °1. Naproxen (Aleve, etc) Two 220mg tabs twice a day °2. Ibuprofen (Advil, etc) Three 200mg tabs four times a day (every meal & bedtime) °3. Acetaminophen (Tylenol, etc) 500-650mg four times a day (every meal & bedtime) °4. A prescription for pain medication (such as oxycodone, hydrocodone, etc) should be given to you upon discharge. Take your pain medication as prescribed.  °1. If you are having problems/concerns with the prescription medicine (does not control pain, nausea, vomiting, rash, itching, etc), please call us [phone]  [phone] to see if we need to switch you to a different pain medicine that will work better for you and/or control your side effect better. °2. If you need a refill on your pain medication, please contact your pharmacy. They will contact our office to request authorization. Prescriptions will not be filled after 5 pm or on week-ends. °4. Avoid getting constipated. Between the surgery and the pain medications, it is common to experience some constipation. Increasing fluid intake and taking a fiber supplement (such as Metamucil, Citrucel, FiberCon, MiraLax, etc) 1-2 times a day regularly will usually help prevent this problem from occurring. A mild laxative (prune juice, Milk of Magnesia, MiraLax, etc) should be taken according to package directions if there are no bowel movements after 48 hours.  °5. Watch out for diarrhea. If you have many loose bowel movements, simplify your diet to bland foods & liquids for a few days. Stop any stool softeners and decrease your fiber supplement. Switching to mild anti-diarrheal medications (Kayopectate, Pepto Bismol) can help. If this worsens or does not improve, please call us. °6. Wash / shower every day. You may shower over the dressings as they are waterproof. Continue to shower over incision(s) after the dressing is off. If there is glue over the incisions try not to pick it off, let it fall off naturally. °7. Remove your waterproof bandages 2 days after surgery. You may leave the incision open to air. You may replace a dressing/Band-Aid to cover the incision for comfort if you wish.  °8. ACTIVITIES as tolerated:  °  1. You may resume regular (light) daily activities beginning the next day--such as daily self-care, walking, climbing stairs--gradually increasing activities as tolerated. If you can walk 30 minutes without difficulty, it is safe to try more intense activity such as jogging, treadmill, bicycling, low-impact aerobics, swimming, etc. 2. Save the most intensive and  strenuous activity for last such as sit-ups, heavy lifting, contact sports, etc Refrain from any heavy lifting or straining until you are off narcotics for pain control. For the first 2-3 weeks do not lift over 10-15lb.  3. DO NOT PUSH THROUGH PAIN. Let pain be your guide: If it hurts to do something, don't do it. Pain is your body warning you to avoid that activity for another week until the pain goes down. 4. You may drive when you are no longer taking prescription pain medication, you can comfortably wear a seatbelt, and you can safely maneuver your car and apply brakes. 5. You may have sexual intercourse when it is comfortable.  9. FOLLOW UP in our office  1. Please call [facility] at [phone] to set up an appointment to see your surgeon in the office for a follow-up appointment approximately 2-3 weeks after your surgery. 2. Make sure that you call for this appointment the day you arrive home to insure a convenient appointment time.      10. IF YOU HAVE DISABILITY OR FAMILY LEAVE FORMS, BRING THEM TO THE               OFFICE FOR PROCESSING.   WHEN TO CALL us [phone]:  1. Poor pain control 2. Reactions / problems with new medications (rash/itching, nausea, etc)  3. Fever over 101.5 F (38.5 C) 4. Inability to urinate 5. Nausea and/or vomiting 6. Worsening swelling or bruising 7. Continued bleeding from incision. 8. Increased pain, redness, or drainage from the incision  The clinic staff is available to answer your questions during regular business hours (8:30am-5pm). Please dont hesitate to call and ask to speak to one of our nurses for clinical concerns.  If you have a medical emergency, go to the nearest emergency room or call 911.  A surgeon from [facility] is always on call at the [facility], [place], [place], [place], [place] [address] ?  MAIN: [phone] ? TOLL FREE: [phone] ?  FAX [phone]    [email or web address]         MAY RETURN TO WORK ON  Monday July 8 NO RESTRICTIONS   Do not drive while taking pain medications ( vicodin)

## 2018-02-17 NOTE — Anesthesia Preprocedure Evaluation (Addendum)
Anesthesia Evaluation  Patient identified by MRN, date of birth, ID band Patient awake    Reviewed: Allergy & Precautions, NPO status , Patient's Chart, lab work & pertinent test results  Airway Mallampati: II  TM Distance: >3 FB Neck ROM: Full    Dental  (+) Teeth Intact, Dental Advisory Given,    Pulmonary former smoker,    breath sounds clear to auscultation       Cardiovascular  Rhythm:Regular Rate:Normal     Neuro/Psych    GI/Hepatic   Endo/Other    Renal/GU      Musculoskeletal   Abdominal   Peds  Hematology   Anesthesia Other Findings   Reproductive/Obstetrics                            Anesthesia Physical Anesthesia Plan  ASA: III  Anesthesia Plan: General   Post-op Pain Management:    Induction:   PONV Risk Score and Plan: Ondansetron and Dexamethasone  Airway Management Planned: Oral ETT  Additional Equipment:   Intra-op Plan:   Post-operative Plan: Extubation in OR  Informed Consent: I have reviewed the patients History and Physical, chart, labs and discussed the procedure including the risks, benefits and alternatives for the proposed anesthesia with the patient or authorized representative who has indicated his/her understanding and acceptance.   Dental advisory given  Plan Discussed with: CRNA and Anesthesiologist  Anesthesia Plan Comments:         Anesthesia Quick Evaluation

## 2018-02-17 NOTE — Progress Notes (Signed)
Progress Note  Patient Name: Travis Jenkins Date of Encounter: 02/17/2018  Primary Cardiologist: Sanda Klein, MD   Subjective   Feeling OK, just tired. Nocturnal bradycardia to high 30s (sinus).  Inpatient Medications    Scheduled Meds: . aspirin EC  81 mg Oral Daily  . dicyclomine  20 mg Intramuscular Once  . iohexol  100 mL Intravenous Once  . rosuvastatin  20 mg Oral Daily   Continuous Infusions: . cefTRIAXone (ROCEPHIN)  IV 2 g (02/17/18 0014)   PRN Meds: acetaminophen **OR** acetaminophen, bisacodyl, HYDROcodone-acetaminophen, morphine injection, ondansetron, senna-docusate, zolpidem   Vital Signs    Vitals:   02/16/18 0535 02/16/18 1340 02/16/18 2051 02/17/18 0453  BP: 122/74 138/89 106/63 131/75  Pulse: (!) 48 (!) 54 (!) 51 (!) 49  Resp: 18 16 18 16   Temp: 97.7 F (36.5 C) (!) 97.5 F (36.4 C) 98 F (36.7 C) 97.9 F (36.6 C)  TempSrc: Oral Oral Oral Oral  SpO2: 95% 97% 97% 97%  Weight:      Height:        Intake/Output Summary (Last 24 hours) at 02/17/2018 0944 Last data filed at 02/16/2018 1920 Gross per 24 hour  Intake 1285 ml  Output -  Net 1285 ml   Filed Weights   02/12/18 0301 02/12/18 0747  Weight: 210 lb (95.3 kg) 214 lb 8.1 oz (97.3 kg)    Telemetry    Sinus bradycardia - Personally Reviewed  ECG    No new tracing - Personally Reviewed  Physical Exam  Alert, comfortable GEN: No acute distress.   Neck: No JVD Cardiac: RRR, no murmurs, rubs, or gallops.  Respiratory: Clear to auscultation bilaterally. GI: Soft, nontender, non-distended  MS: No edema; No deformity. Neuro:  Nonfocal  Psych: Normal affect   Labs    Chemistry Recent Labs  Lab 02/14/18 0842 02/16/18 0528 02/17/18 0639  NA 139 139 139  K 4.0 4.0 4.1  CL 104 104 104  CO2 27 26 27   GLUCOSE 99 100* 100*  BUN 11 11 15   CREATININE 1.13 1.05 1.17  CALCIUM 9.0 9.0 9.1  PROT 6.5 6.4* 7.0  ALBUMIN 4.1 3.9 4.3  AST 32 20 20  ALT 73* 40 36  ALKPHOS 65 54  58  BILITOT 0.9 0.7 0.7  GFRNONAA >60 >60 >60  GFRAA >60 >60 >60  ANIONGAP 8 9 8      Hematology Recent Labs  Lab 02/14/18 0842 02/16/18 0528 02/17/18 0639  WBC 5.8 5.7 5.4  RBC 4.81 4.60 5.03  HGB 14.8 14.1 15.5  HCT 44.7 42.1 46.3  MCV 92.9 91.5 92.0  MCH 30.8 30.7 30.8  MCHC 33.1 33.5 33.5  RDW 12.0 12.0 12.0  PLT 159 181 158    Cardiac EnzymesNo results for input(s): TROPONINI in the last 168 hours.  Recent Labs  Lab 02/12/18 0313  TROPIPOC 0.00     BNPNo results for input(s): BNP, PROBNP in the last 168 hours.   DDimer No results for input(s): DDIMER in the last 168 hours.   Radiology    No results found.  Cardiac Studies   05/01/2017 CATH 1. Significant single-vessel coronary artery disease, with sequential 70% and 40% mid and distal LAD stenoses. FFR was hemodynamically significant at 0.76. 2. Normal left ventricular contraction. 3. Mildly elevated left ventricular filling pressure. 4. Successful PCI to 70% mid LAD with placement of a Resolute Onyx 3.5 x 12 mm drug-eluting stent with 0% residual stenosis and TIMI-3 flow. Moderate-caliber D2 branch  was jailed by the stent with resultant plaque shift and ostial stenosis. Following balloon angioplasty of D2, there is 70% residual stenosis at the ostium with TIMI-3 flow.  Recommendations: 1. Dual antiplatelet therapy with aspirin and ticagrelor for at least 12 months. 2. Medical management of residual ostial D2 disease; further intervention not attempted due to lack of chest pain and TIMI-3 flow. 3. Aggressive secondary prevention, including escalation of rosuvastatin to 20 mg daily. Fasting lipid panel ordered for tomorrow morning.    Patient Profile    67 y.o. male with LAD stent Sept 2019 on Brilinta, needs urgent (but not emergent) cholecystectomy  Assessment & Plan    1. CAD: Asymptomatic.  Plan to discontinue Brilinta permanently in September. 2. Possible OSA: will schedule OP sleep study 3.   Sinus bradycardia: he has sinus bradycardia both while awake and when asleep. He does not have an urgent need for pacemaker. Will review possible indication after OP sleep study. 4.  Cholelithiasis: For cholecystectomy on this admission. Awaiting acceptable return of platelet activity before surgery.   For questions or updates, please contact [place] Please consult [email or web address] for contact info under Cardiology/STEMI.      Signed, Sanda Klein, MD  02/17/2018, 9:44 AM

## 2018-02-17 NOTE — Progress Notes (Signed)
PROGRESS NOTE  Travis Jenkins UYQ:[id number] DOB: 02-May-1951 DOA: 02/12/2018 PCP: Lujean Amel, MD   LOS: 5 days   Brief Narrative / Interim history: 67 year old male with history of CAD status post cath and stent September 2018, who was admitted to the hospital on 6/28 with right upper quadrant abdominal pain after eating fatty foods, imaging showed cholelithiasis, had mild LFT elevation.  General surgery was consulted and patient will be admitted with biliary colic and he is scheduled to get a cholecystectomy early next week.  He has been on Brilinta which is now on hold.  Cardiology consulted for preop evaluation.  Hopefully will get surgery on 7/3  Assessment & Plan: Principal Problem:   Cholelithiasis Active Problems:   Bradycardia   CKD (chronic kidney disease) stage 2, GFR 60-89 ml/min   Hyperlipidemia   NSTEMI (non-ST elevated myocardial infarction) (HCC)   CAD (coronary artery disease)   Abdominal pain   IBS (irritable bowel syndrome)   Elevated glucose   Elevated serum creatinine   Preoperative cardiovascular examination   Abdominal pain in the setting of cholelithiasis with biliary colic -General surgery following, plan for cholecystectomy today. Patient has been 5 days off brilinta.   Coronary artery disease -Status post stenting of proximal-mid LAD in September 2018, cardiology following, no further testing at this point from cardiac standpoint.  Clear by cardiology   Asymptomatic Bradycardia.  Needs sleep study out patient.   Hyperlipidemia -Continue statin  Elevated LFTs -Due to #1, LFTs continue to improve   DVT prophylaxis: SCDs Code Status: Full code Family Communication: no family at bedside Disposition Plan: home when ready   Consultants:   General surgery  Cardiology   Procedures:   None  Antimicrobials:  Ceftriaxone 6/28 >>   Subjective: Denies abdominal pain.   Objective: Vitals:   02/17/18 1320 02/17/18 1335 02/17/18 1350  02/17/18 1420  BP: (!) 150/84 138/79 (!) 144/82 (!) 146/82  Pulse: 75 63 62 61  Resp: 16 11 10    Temp:    (!) 97.3 F (36.3 C)  TempSrc:    Oral  SpO2: 98% 98% 98% 99%  Weight:      Height:        Intake/Output Summary (Last 24 hours) at 02/17/2018 1623 Last data filed at 02/17/2018 1310 Gross per 24 hour  Intake 1055 ml  Output 10 ml  Net 1045 ml   Filed Weights   02/12/18 0301 02/12/18 0747  Weight: 95.3 kg (210 lb) 97.3 kg (214 lb 8.1 oz)    Examination:  Constitutional: NAD Respiratory: CTA Cardiovascular: S 1, S 2 RRR  Data Reviewed: I have independently reviewed following labs and imaging studies   CBC: Recent Labs  Lab 02/12/18 0306 02/13/18 0658 02/14/18 0842 02/16/18 0528 02/17/18 0639  WBC 9.7 4.9 5.8 5.7 5.4  HGB 15.2 13.6 14.8 14.1 15.5  HCT 45.2 41.2 44.7 42.1 46.3  MCV 92.2 93.2 92.9 91.5 92.0  PLT 168 156 159 181 638   Basic Metabolic Panel: Recent Labs  Lab 02/12/18 0306 02/13/18 0658 02/14/18 0842 02/16/18 0528 02/17/18 0639  NA 140 140 139 139 139  K 4.1 4.1 4.0 4.0 4.1  CL 102 106 104 104 104  CO2 29 24 27 26 27   GLUCOSE 146* 100* 99 100* 100*  BUN 10 9 11 11 15   CREATININE 1.33* 1.14 1.13 1.05 1.17  CALCIUM 9.8 8.3* 9.0 9.0 9.1   GFR: Estimated Creatinine Clearance: 77.4 mL/min (by C-G formula based on SCr of  1.17 mg/dL). Liver Function Tests: Recent Labs  Lab 02/12/18 0306 02/13/18 0658 02/14/18 0842 02/16/18 0528 02/17/18 0639  AST 306* 61* 32 20 20  ALT 165* 96* 73* 40 36  ALKPHOS 77 67 65 54 58  BILITOT 1.0 0.7 0.9 0.7 0.7  PROT 6.8 5.8* 6.5 6.4* 7.0  ALBUMIN 4.4 3.4* 4.1 3.9 4.3   Recent Labs  Lab 02/12/18 0306  LIPASE 36   No results for input(s): AMMONIA in the last 168 hours. Coagulation Profile: Recent Labs  Lab 02/13/18 0658  INR 1.06   Cardiac Enzymes: No results for input(s): CKTOTAL, CKMB, CKMBINDEX, TROPONINI in the last 168 hours. BNP (last 3 results) No results for input(s): PROBNP in the  last 8760 hours. HbA1C: No results for input(s): HGBA1C in the last 72 hours. CBG: Recent Labs  Lab 02/12/18 1831 02/12/18 2126 02/13/18 0633 02/13/18 0751 02/13/18 1243  GLUCAP 77 141* 94 96 89   Lipid Profile: No results for input(s): CHOL, HDL, LDLCALC, TRIG, CHOLHDL, LDLDIRECT in the last 72 hours. Thyroid Function Tests: No results for input(s): TSH, T4TOTAL, FREET4, T3FREE, THYROIDAB in the last 72 hours. Anemia Panel: No results for input(s): VITAMINB12, FOLATE, FERRITIN, TIBC, IRON, RETICCTPCT in the last 72 hours. Urine analysis:    Component Value Date/Time   COLORURINE YELLOW 02/12/2018 0310   APPEARANCEUR CLOUDY (A) 02/12/2018 0310   LABSPEC 1.008 02/12/2018 0310   PHURINE 8.0 02/12/2018 0310   GLUCOSEU NEGATIVE 02/12/2018 0310   HGBUR NEGATIVE 02/12/2018 0310   BILIRUBINUR NEGATIVE 02/12/2018 0310   KETONESUR NEGATIVE 02/12/2018 0310   PROTEINUR NEGATIVE 02/12/2018 0310   NITRITE NEGATIVE 02/12/2018 0310   LEUKOCYTESUR NEGATIVE 02/12/2018 0310   Sepsis Labs: Invalid input(s): PROCALCITONIN, LACTICIDVEN  Recent Results (from the past 240 hour(s))  Surgical pcr screen     Status: None   Collection Time: 02/15/18  9:29 PM  Result Value Ref Range Status   MRSA, PCR NEGATIVE NEGATIVE Final   Staphylococcus aureus NEGATIVE NEGATIVE Final    Comment: (NOTE) The Xpert SA Assay (FDA approved for NASAL specimens in patients 77 years of age and older), is one component of a comprehensive surveillance program. It is not intended to diagnose infection nor to guide or monitor treatment. Performed at [facility], [place] [address], [place], [place] [address]       Radiology Studies: No results found.   Scheduled Meds: . aspirin EC  81 mg Oral Daily  . dicyclomine  20 mg Intramuscular Once  . fentaNYL      . iohexol  100 mL Intravenous Once  . rosuvastatin  20 mg Oral Daily   Continuous Infusions: . sodium chloride 50 mL/hr at 02/17/18 1429  .  cefTRIAXone (ROCEPHIN)  IV 2 g (02/17/18 0014)    Travis Hummer, MD [facility] Pager [phone]  If 7PM-7AM, please contact night-coverage [email or web address] Password [facility] 02/17/2018, 4:23 PM

## 2018-02-17 NOTE — Progress Notes (Signed)
   Subjective/Chief Complaint: Feels fine, no complaints  Objective: Vital signs in last 24 hours: Temp:  [97.5 F (36.4 C)-98 F (36.7 C)] 97.9 F (36.6 C) (07/03 0453) Pulse Rate:  [49-54] 49 (07/03 0453) Resp:  [16-18] 16 (07/03 0453) BP: (106-138)/(63-89) 131/75 (07/03 0453) SpO2:  [97 %] 97 % (07/03 0453) Last BM Date: 02/15/18  Intake/Output from previous day: 07/02 0701 - 07/03 0700 In: 1285 [P.O.:1285] Out: -  Intake/Output this shift: No intake/output data recorded.  GI: soft nt  Lab Results:  Recent Labs    02/16/18 0528 02/17/18 0639  WBC 5.7 5.4  HGB 14.1 15.5  HCT 42.1 46.3  PLT 181 158   BMET Recent Labs    02/16/18 0528 02/17/18 0639  NA 139 139  K 4.0 4.1  CL 104 104  CO2 26 27  GLUCOSE 100* 100*  BUN 11 15  CREATININE 1.05 1.17  CALCIUM 9.0 9.1   PT/INR No results for input(s): LABPROT, INR in the last 72 hours. ABG No results for input(s): PHART, HCO3 in the last 72 hours.  Invalid input(s): PCO2, PO2  Studies/Results: No results found.  Anti-infectives: Anti-infectives (From admission, onward)   Start     Dose/Rate Route Frequency Ordered Stop   02/17/18 1045  ceFAZolin (ANCEF) IVPB 2g/100 mL premix     2 g 200 mL/hr over 30 Minutes Intravenous  Once 02/17/18 1039     02/13/18 0000  cefTRIAXone (ROCEPHIN) 2 g in sodium chloride 0.9 % 100 mL IVPB     2 g 200 mL/hr over 30 Minutes Intravenous Every 24 hours 02/12/18 0709     02/12/18 0600  cefTRIAXone (ROCEPHIN) 2 g in sodium chloride 0.9 % 100 mL IVPB     2 g 200 mL/hr over 30 Minutes Intravenous  Once 02/12/18 0549 02/12/18 0648      Assessment/Plan: CAD,s/pstenting mid LAD, 04/2017, on Brilinta -Brilinta on hold and cardiology following- last dose 6/27 at 1800 -Platelet inhibition p2y12 has been followed this is up and down. He has been 5 days of brilinta now and is on asa.  I think he is fine to proceed with surgery given time frame and dont know exactly what role  of continuing to follow this lab is in his case.  Gallstones with biliary colic versus low-gradecholecystitis -u/s showsCholelithiasis, no secondary signs of acute cholecystitis - LFTs normal ID -rocephin 6/28>>  I discussed the procedure in detail.   We discussed the risks and benefits of a laparoscopic cholecystectomy and possible cholangiogram including, but not limited to bleeding, infection, injury to surrounding structures such as the intestine or liver, bile leak, retained gallstones, need to convert to an open procedure, prolonged diarrhea, blood clots such as  DVT, common bile duct injury, anesthesia risks, and possible need for additional procedures.  The likelihood of improvement in symptoms and return to the patient's normal status is good. We discussed the typical post-operative recovery course.     Travis Jenkins 02/17/2018

## 2018-02-17 NOTE — Anesthesia Postprocedure Evaluation (Signed)
Anesthesia Post Note  Patient: Travis Jenkins  Procedure(s) Performed: LAPAROSCOPIC CHOLECYSTECTOMY (N/A Abdomen)     Patient location during evaluation: PACU Anesthesia Type: General Level of consciousness: awake and alert Pain management: pain level controlled Vital Signs Assessment: post-procedure vital signs reviewed and stable Respiratory status: spontaneous breathing, nonlabored ventilation, respiratory function stable and patient connected to nasal cannula oxygen Cardiovascular status: blood pressure returned to baseline and stable Postop Assessment: no apparent nausea or vomiting Anesthetic complications: no    Last Vitals:  Vitals:   02/17/18 1335 02/17/18 1350  BP: 138/79 (!) 144/82  Pulse: 63 62  Resp: 11 10  Temp:    SpO2: 98% 98%    Last Pain:  Vitals:   02/17/18 1350  TempSrc:   PainSc: 6                  Adelyna Brockman COKER

## 2018-02-17 NOTE — Anesthesia Procedure Notes (Signed)
Procedure Name: Intubation Date/Time: 02/17/2018 12:03 PM Performed by: Barrington Ellison, CRNA Pre-anesthesia Checklist: Patient identified, Emergency Drugs available, Suction available and Patient being monitored Patient Re-evaluated:Patient Re-evaluated prior to induction Oxygen Delivery Method: Circle System Utilized Preoxygenation: Pre-oxygenation with 100% oxygen Induction Type: IV induction Ventilation: Mask ventilation without difficulty Laryngoscope Size: Mac and 4 Grade View: Grade I Tube type: Oral Tube size: 7.5 mm Number of attempts: 1 Airway Equipment and Method: Stylet and Oral airway Placement Confirmation: ETT inserted through vocal cords under direct vision,  positive ETCO2 and breath sounds checked- equal and bilateral Secured at: 22 cm Tube secured with: Tape Dental Injury: Teeth and Oropharynx as per pre-operative assessment

## 2018-02-18 ENCOUNTER — Encounter (HOSPITAL_COMMUNITY): Payer: Self-pay | Admitting: General Surgery

## 2018-02-18 MED ORDER — SENNOSIDES-DOCUSATE SODIUM 8.6-50 MG PO TABS: 1.0000 | ORAL_TABLET | Freq: Every evening | ORAL | 0 refills | Status: DC | PRN

## 2018-02-18 MED ORDER — HYDROCODONE-ACETAMINOPHEN 5-325 MG PO TABS: 1.0000 | ORAL_TABLET | Freq: Four times a day (QID) | ORAL | 0 refills | Status: DC | PRN

## 2018-02-18 NOTE — Discharge Summary (Signed)
Physician Discharge Summary  Travis Jenkins:[id number] DOB: [birth year] DOA: 02/12/2018  PCP: Lujean Amel, MD  Admit date: 02/12/2018 Discharge date: 02/18/2018  Admitted From: Home  Disposition: Home   Recommendations for Outpatient Follow-up:  1. Follow up with PCP in 1-2 weeks 2. Please obtain BMP/CBC in one week 3. Follow up with sx post op.     Discharge Condition: stable CODE STATUS; full code Diet recommendation: Heart Healthy   Brief/Interim Summary: Brief Narrative / Interim history: 67 year old male with history of CAD status post cath and stent September 2018, who was admitted to the hospital on 6/28 with right upper quadrant abdominal pain after eating fatty foods, imaging showed cholelithiasis, had mild LFT elevation.  General surgery was consulted and patient will be admitted with biliary colic and he is scheduled to get a cholecystectomy early next week.  He has been on Brilinta which is now on hold.  Cardiology consulted for preop evaluation.  Hopefully will get surgery on 7/3  Assessment & Plan: Principal Problem:   Cholelithiasis Active Problems:   Bradycardia   CKD (chronic kidney disease) stage 2, GFR 60-89 ml/min   Hyperlipidemia   NSTEMI (non-ST elevated myocardial infarction) (HCC)   CAD (coronary artery disease)   Abdominal pain   IBS (irritable bowel syndrome)   Elevated glucose   Elevated serum creatinine   Preoperative cardiovascular examination   Abdominal pain in the setting of cholelithiasis with biliary colic -underwent cholecystectomy 7-03.Marland Kitchen Patient has been 5 days off brilinta.  Doing well post op. Had BM. Tolerating diet.  Clear to be discharge by sx.  Advised no to drive if he is taking pain medications.   Coronary artery disease -Status post stenting of proximal-mid LAD in September 2018, cardiology following, no further testing at this point from cardiac standpoint. per cardiology, patient to resume brilinta in am.  Clear by  cardiology for sx  Asymptomatic Bradycardia.  Needs sleep study out patient.   Hyperlipidemia -Continue statin  Elevated LFTs -Due to #1, LFTs continue to improve     Discharge Diagnoses:  Principal Problem:   Cholelithiasis Active Problems:   Bradycardia   CKD (chronic kidney disease) stage 2, GFR 60-89 ml/min   Hyperlipidemia   NSTEMI (non-ST elevated myocardial infarction) (HCC)   CAD (coronary artery disease)   Abdominal pain   IBS (irritable bowel syndrome)   Elevated glucose   Elevated serum creatinine   Preoperative cardiovascular examination    Discharge Instructions  Discharge Instructions    Diet - low sodium heart healthy   Complete by:  As directed    Increase activity slowly   Complete by:  As directed      Allergies as of 02/18/2018      Reactions   Wasp Venom Anaphylaxis   In Sept 2018, stung by multiple yellow jackets   Erythromycin Rash   Tetracyclines & Related Rash      Medication List    TAKE these medications   acetaminophen 325 MG tablet Commonly known as:  TYLENOL Take 325-650 mg by mouth every 6 (six) hours as needed (for pain or headaches).   aspirin EC 81 MG tablet Take 81 mg by mouth daily.   HYDROcodone-acetaminophen 5-325 MG tablet Commonly known as:  NORCO/VICODIN Take 1-2 tablets by mouth every 6 (six) hours as needed for moderate pain.   nitroGLYCERIN 0.4 MG SL tablet Commonly known as:  NITROSTAT Place 1 tablet (0.4 mg total) under the tongue every 5 (five) minutes as needed for  chest pain.   rosuvastatin 20 MG tablet Commonly known as:  CRESTOR Take 1 tablet (20 mg total) by mouth daily.   senna-docusate 8.6-50 MG tablet Commonly known as:  Senokot-S Take 1 tablet by mouth at bedtime as needed for mild constipation.   ticagrelor 90 MG Tabs tablet Commonly known as:  BRILINTA Take 1 tablet (90 mg total) by mouth 2 (two) times daily.      Follow-up [facility], [place]. Go on  03/04/2018.   Specialty:  General Surgery Why:  Your appointment is 07/18 at 2:30 pm Please arrive 30 minutes prior to your appointment to check in and fill out paperwork. Bring photo ID and insurance information. Contact information: [place] [phone]         Allergies  Allergen Reactions  . 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 Erythromycin Rash  . Tetracyclines & Related Rash    Consultations:  Surgery  Cardiology    Procedures/Studies: US Abdomen Limited Ruq  Result Date: 02/12/2018 CLINICAL DATA:  67 y/o  M; right upper quadrant pain. EXAM: ULTRASOUND ABDOMEN LIMITED RIGHT UPPER QUADRANT COMPARISON:  None. FINDINGS: Gallbladder: Large gallstone measuring up to 3.7 cm and gallbladder sludge. No gallbladder wall thickening or pericholecystic fluid. Negative sonographic Murphy's sign. Common bile duct: Diameter: 4.5 mm Liver: No focal lesion identified. Within normal limits in parenchymal echogenicity. Portal vein is patent on color Doppler imaging with normal direction of blood flow towards the liver. IMPRESSION: Cholelithiasis.  No secondary signs of acute cholecystitis. Electronically Signed   By: Kristine Garbe M.D.   On: 02/12/2018 05:29       Subjective: Ambulating, tolerating diet, no significant pain   Discharge Exam: Vitals:   02/18/18 0519 02/18/18 1032  BP: 124/68 116/62  Pulse: (!) 52 70  Resp: 16 16  Temp: 98 F (36.7 C) 98.6 F (37 C)  SpO2: 98% 96%   Vitals:   02/17/18 2306 02/18/18 0500 02/18/18 0519 02/18/18 1032  BP: 121/70  124/68 116/62  Pulse: 60  (!) 52 70  Resp: 10  16 16   Temp: 98.4 F (36.9 C)  98 F (36.7 C) 98.6 F (37 C)  TempSrc: Oral  Oral Oral  SpO2: 94%  98% 96%  Weight:  97.7 kg (215 lb 6.2 oz)    Height:        General: Pt is alert, awake, not in acute distress Cardiovascular: RRR, S1/S2 +, no rubs, no  gallops Respiratory: CTA bilaterally, no wheezing, no rhonchi Abdominal: Soft, NT, ND, bowel sounds + Extremities: no edema, no cyanosis    The results of significant diagnostics from this hospitalization (including imaging, microbiology, ancillary and laboratory) are listed below for reference.     Microbiology: Recent Results (from the past 240 hour(s))  Surgical pcr screen     Status: None   Collection Time: 02/15/18  9:29 PM  Result Value Ref Range Status   MRSA, PCR NEGATIVE NEGATIVE Final   Staphylococcus aureus NEGATIVE NEGATIVE Final    Comment: (NOTE) The Xpert SA Assay (FDA approved for NASAL specimens in patients 20 years of age and older), is one component of a comprehensive surveillance program. It is not intended to diagnose infection nor to guide or monitor treatment. Performed at [facility], [place] [address] [place], [place] [address]      Labs: BNP (last 3 results)  No results for input(s): BNP in the last 8760 hours. Basic Metabolic Panel: Recent Labs  Lab 02/12/18 0306 02/13/18 0658 02/14/18 0842 02/16/18 0528 02/17/18 0639  NA 140 140 139 139 139  K 4.1 4.1 4.0 4.0 4.1  CL 102 106 104 104 104  CO2 29 24 27 26 27   GLUCOSE 146* 100* 99 100* 100*  BUN 10 9 11 11 15   CREATININE 1.33* 1.14 1.13 1.05 1.17  CALCIUM 9.8 8.3* 9.0 9.0 9.1   Liver Function Tests: Recent Labs  Lab 02/12/18 0306 02/13/18 0658 02/14/18 0842 02/16/18 0528 02/17/18 0639  AST 306* 61* 32 20 20  ALT 165* 96* 73* 40 36  ALKPHOS 77 67 65 54 58  BILITOT 1.0 0.7 0.9 0.7 0.7  PROT 6.8 5.8* 6.5 6.4* 7.0  ALBUMIN 4.4 3.4* 4.1 3.9 4.3   Recent Labs  Lab 02/12/18 0306  LIPASE 36   No results for input(s): AMMONIA in the last 168 hours. CBC: Recent Labs  Lab 02/12/18 0306 02/13/18 0658 02/14/18 0842 02/16/18 0528 02/17/18 0639  WBC 9.7 4.9 5.8 5.7 5.4  HGB 15.2 13.6 14.8 14.1 15.5  HCT 45.2 41.2 44.7 42.1 46.3  MCV 92.2 93.2 92.9 91.5 92.0  PLT 168 156 159  181 158   Cardiac Enzymes: No results for input(s): CKTOTAL, CKMB, CKMBINDEX, TROPONINI in the last 168 hours. BNP: Invalid input(s): POCBNP CBG: Recent Labs  Lab 02/12/18 1831 02/12/18 2126 02/13/18 0633 02/13/18 0751 02/13/18 1243  GLUCAP 77 141* 94 96 89   D-Dimer No results for input(s): DDIMER in the last 72 hours. Hgb A1c No results for input(s): HGBA1C in the last 72 hours. Lipid Profile No results for input(s): CHOL, HDL, LDLCALC, TRIG, CHOLHDL, LDLDIRECT in the last 72 hours. Thyroid function studies No results for input(s): TSH, T4TOTAL, T3FREE, THYROIDAB in the last 72 hours.  Invalid input(s): FREET3 Anemia work up No results for input(s): VITAMINB12, FOLATE, FERRITIN, TIBC, IRON, RETICCTPCT in the last 72 hours. Urinalysis    Component Value Date/Time   COLORURINE YELLOW 02/12/2018 0310   APPEARANCEUR CLOUDY (A) 02/12/2018 0310   LABSPEC 1.008 02/12/2018 0310   PHURINE 8.0 02/12/2018 0310   GLUCOSEU NEGATIVE 02/12/2018 0310   HGBUR NEGATIVE 02/12/2018 0310   BILIRUBINUR NEGATIVE 02/12/2018 0310   KETONESUR NEGATIVE 02/12/2018 0310   PROTEINUR NEGATIVE 02/12/2018 0310   NITRITE NEGATIVE 02/12/2018 0310   LEUKOCYTESUR NEGATIVE 02/12/2018 0310   Sepsis Labs Invalid input(s): PROCALCITONIN,  WBC,  LACTICIDVEN Microbiology Recent Results (from the past 240 hour(s))  Surgical pcr screen     Status: None   Collection Time: 02/15/18  9:29 PM  Result Value Ref Range Status   MRSA, PCR NEGATIVE NEGATIVE Final   Staphylococcus aureus NEGATIVE NEGATIVE Final    Comment: (NOTE) The Xpert SA Assay (FDA approved for NASAL specimens in patients 68 years of age and older), is one component of a comprehensive surveillance program. It is not intended to diagnose infection nor to guide or monitor treatment. Performed at [facility], [place] [address], [place], [place] [address]      Time coordinating discharge: 35 minutes.   SIGNED:   Elmarie Shiley, MD  [facility] 02/18/2018, 1:01 PM Pager   If 7PM-7AM, please contact night-coverage [email or web address] Password [email or web address]

## 2018-02-18 NOTE — Progress Notes (Signed)
Progress Note  Patient Name: Travis Jenkins Date of Encounter: 02/18/2018  Primary Cardiologist: Sanda Klein, MD   Subjective   Mildly tender at surgical site, holding down food.  Inpatient Medications    Scheduled Meds: . aspirin EC  81 mg Oral Daily  . dicyclomine  20 mg Intramuscular Once  . iohexol  100 mL Intravenous Once  . rosuvastatin  20 mg Oral Daily   Continuous Infusions: . sodium chloride 75 mL/hr at 02/18/18 0730  . cefTRIAXone (ROCEPHIN)  IV Stopped (02/18/18 0016)   PRN Meds: acetaminophen **OR** acetaminophen, bisacodyl, HYDROcodone-acetaminophen, morphine injection, ondansetron, senna-docusate, zolpidem   Vital Signs    Vitals:   02/17/18 1857 02/17/18 2306 02/18/18 0500 02/18/18 0519  BP: (!) 145/95 121/70  124/68  Pulse: 86 60  (!) 52  Resp:  10  16  Temp: 98.2 F (36.8 C) 98.4 F (36.9 C)  98 F (36.7 C)  TempSrc: Oral Oral  Oral  SpO2: 98% 94%  98%  Weight:   215 lb 6.2 oz (97.7 kg)   Height:        Intake/Output Summary (Last 24 hours) at 02/18/2018 0854 Last data filed at 02/18/2018 0600 Gross per 24 hour  Intake 1646.26 ml  Output 385 ml  Net 1261.26 ml   Filed Weights   02/12/18 0301 02/12/18 0747 02/18/18 0500  Weight: 210 lb (95.3 kg) 214 lb 8.1 oz (97.3 kg) 215 lb 6.2 oz (97.7 kg)    Telemetry    NSR/sinus brady - Personally Reviewed  ECG    No new tracing - Personally Reviewed  Physical Exam  Comfortable GEN: No acute distress.   Neck: No JVD Cardiac: RRR, no murmurs, rubs, or gallops.  Respiratory: Clear to auscultation bilaterally. GI: Soft, minimally tender, non-distended  MS: No edema; No deformity. Neuro:  Nonfocal  Psych: Normal affect   Labs    Chemistry Recent Labs  Lab 02/14/18 0842 02/16/18 0528 02/17/18 0639  NA 139 139 139  K 4.0 4.0 4.1  CL 104 104 104  CO2 27 26 27   GLUCOSE 99 100* 100*  BUN 11 11 15   CREATININE 1.13 1.05 1.17  CALCIUM 9.0 9.0 9.1  PROT 6.5 6.4* 7.0  ALBUMIN 4.1  3.9 4.3  AST 32 20 20  ALT 73* 40 36  ALKPHOS 65 54 58  BILITOT 0.9 0.7 0.7  GFRNONAA >60 >60 >60  GFRAA >60 >60 >60  ANIONGAP 8 9 8      Hematology Recent Labs  Lab 02/14/18 0842 02/16/18 0528 02/17/18 0639  WBC 5.8 5.7 5.4  RBC 4.81 4.60 5.03  HGB 14.8 14.1 15.5  HCT 44.7 42.1 46.3  MCV 92.9 91.5 92.0  MCH 30.8 30.7 30.8  MCHC 33.1 33.5 33.5  RDW 12.0 12.0 12.0  PLT 159 181 158    Cardiac EnzymesNo results for input(s): TROPONINI in the last 168 hours.  Recent Labs  Lab 02/12/18 0313  TROPIPOC 0.00     BNPNo results for input(s): BNP, PROBNP in the last 168 hours.   DDimer No results for input(s): DDIMER in the last 168 hours.   Radiology    No results found.  Cardiac Studies   05/01/2017 CATH 1. Significant single-vessel coronary artery disease, with sequential 70% and 40% mid and distal LAD stenoses. FFR was hemodynamically significant at 0.76. 2. Normal left ventricular contraction. 3. Mildly elevated left ventricular filling pressure. 4. Successful PCI to 70% mid LAD with placement of a Resolute Onyx 3.5 x  12 mm drug-eluting stent with 0% residual stenosis and TIMI-3 flow. Moderate-caliber D2 branch was jailed by the stent with resultant plaque shift and ostial stenosis. Following balloon angioplasty of D2, there is 70% residual stenosis at the ostium with TIMI-3 flow.  Recommendations: 1. Dual antiplatelet therapy with aspirin and ticagrelor for at least 12 months. 2. Medical management of residual ostial D2 disease; further intervention not attempted due to lack of chest pain and TIMI-3 flow. 3. Aggressive secondary prevention, including escalation of rosuvastatin to 20 mg daily. Fasting lipid panel ordered for tomorrow morning.     Patient Profile     67 y.o.malewith LAD stent Sept 2019 on Brilinta, s/p cholecystectomy  Assessment & Plan    Uncomplicated surgery, no bleeding.  [facility] will sign off.   Medication Recommendations:   Resume Brilinta in AM Other recommendations (labs, testing, etc):  n/a Follow up as an outpatient:  Already scheduled in September  For questions or updates, please contact [place] Please consult [email or web address] for contact info under Cardiology/STEMI.      Signed, Sanda Klein, MD  02/18/2018, 8:54 AM

## 2018-02-18 NOTE — Progress Notes (Signed)
1 Day Post-Op   Subjective/Chief Complaint: DOING WELL   Objective: Vital signs in last 24 hours: Temp:  [97.3 F (36.3 C)-98.4 F (36.9 C)] 98 F (36.7 C) (07/04 0519) Pulse Rate:  [52-86] 52 (07/04 0519) Resp:  [10-16] 16 (07/04 0519) BP: (121-150)/(68-95) 124/68 (07/04 0519) SpO2:  [94 %-99 %] 98 % (07/04 0519) Weight:  [97.7 kg (215 lb 6.2 oz)] 97.7 kg (215 lb 6.2 oz) (07/04 0500) Last BM Date: 02/15/18  Intake/Output from previous day: 07/03 0701 - 07/04 0700 In: 1646.3 [I.V.:1446.2; IV Piggyback:200.1] Out: 385 [Urine:375; Blood:10] Intake/Output this shift: No intake/output data recorded.  Incision/Wound:CDI soft NT port sites intact   Lab Results:  Recent Labs    02/16/18 0528 02/17/18 0639  WBC 5.7 5.4  HGB 14.1 15.5  HCT 42.1 46.3  PLT 181 158   BMET Recent Labs    02/16/18 0528 02/17/18 0639  NA 139 139  K 4.0 4.1  CL 104 104  CO2 26 27  GLUCOSE 100* 100*  BUN 11 15  CREATININE 1.05 1.17  CALCIUM 9.0 9.1   PT/INR No results for input(s): LABPROT, INR in the last 72 hours. ABG No results for input(s): PHART, HCO3 in the last 72 hours.  Invalid input(s): PCO2, PO2  Studies/Results: No results found.  Anti-infectives: Anti-infectives (From admission, onward)   Start     Dose/Rate Route Frequency Ordered Stop   02/17/18 1045  ceFAZolin (ANCEF) IVPB 2g/100 mL premix     2 g 200 mL/hr over 30 Minutes Intravenous To [facility] 02/17/18 1039 02/17/18 1205   02/13/18 0000  cefTRIAXone (ROCEPHIN) 2 g in sodium chloride 0.9 % 100 mL IVPB     2 g 200 mL/hr over 30 Minutes Intravenous Every 24 hours 02/12/18 0709     02/12/18 0600  cefTRIAXone (ROCEPHIN) 2 g in sodium chloride 0.9 % 100 mL IVPB     2 g 200 mL/hr over 30 Minutes Intravenous  Once 02/12/18 0549 02/12/18 0648      Assessment/Plan: s/p Procedure(s): LAPAROSCOPIC CHOLECYSTECTOMY (N/A) Doing well Ok to D/C RTW Monday  Follow up in chart   LOS: 6 days    Travis Jenkins  Travis Jenkins 02/18/2018

## 2018-02-18 NOTE — Progress Notes (Signed)
Pt discharged home in stable condition after going over discharge teaching with no concerns voiced. AVS and discharge scripts given before leaving unit

## 2018-02-23 ENCOUNTER — Telehealth: Payer: Self-pay

## 2018-02-23 DIAGNOSIS — R0683 Snoring: Secondary | ICD-10-CM

## 2018-02-23 DIAGNOSIS — G4710 Hypersomnia, unspecified: Secondary | ICD-10-CM

## 2018-02-23 DIAGNOSIS — R5383 Other fatigue: Secondary | ICD-10-CM

## 2018-02-23 DIAGNOSIS — R001 Bradycardia, unspecified: Secondary | ICD-10-CM

## 2018-02-23 NOTE — Telephone Encounter (Signed)
-----   Message from Sanda Klein, MD sent at 02/18/2018  8:56 AM EDT ----- Can we please arrange OP sleep study (hypersomnolence, snoring, fatigue, bradycardia) before his Sept f/u? MCr

## 2018-02-23 NOTE — Telephone Encounter (Signed)
Sleep study ordered

## 2018-02-25 ENCOUNTER — Telehealth: Payer: Self-pay | Admitting: *Deleted

## 2018-02-25 NOTE — Telephone Encounter (Signed)
Left sleep study appointment and contact details on patient's cell #  Ok per dpr. Cal back if questions.

## 2018-02-25 NOTE — Telephone Encounter (Signed)
-----   Message from Lauralee Evener, [place] sent at 02/24/2018 11:37 AM EDT ----- SPLIT NIGHT

## 2018-03-09 ENCOUNTER — Telehealth: Payer: Self-pay | Admitting: *Deleted

## 2018-03-09 NOTE — Telephone Encounter (Signed)
Patient  called in to say that he needed for his planned sleep study to be pre certed. I informed the patient that I had already submitted a request to his insurance via their web portal and it was stated no PA required. Patient states he was told that he does. I called [employer] (the number on back of his card.) and was on hold for 40 minutes. Spoke with Stanton Kidney, [place] and was advised this is a covered benefit and no PA is required. Call reference # is [id number]. Patient was notified of this phone call and he states that he will call them again. He will give them the above call reference # along with the [place] code of 924[address]. He apparently was giving them some other codes that he received from the packet of information hr received from the sleep lab. I informed him the only code he should be giving them is [address].

## 2018-03-10 ENCOUNTER — Telehealth: Payer: Self-pay | Admitting: *Deleted

## 2018-03-10 NOTE — Telephone Encounter (Signed)
Returned another call to patient again informing him no PA is required for his sleep study because his company has opted out of AIM. Patient states he called [facility] customer service and again was told that he needs a PA. I called [employer]  On three way with the patient. CSR was informed the patient was also on the phone. I again requested a PA for the sleep study. Once again I was told no PA is required.  I then questioned the CSR why do we keep getting different answers when we call. She explains that when the patient is calling he is talking to CS. They have a "blanket list" of codes that need to have a PA.when I call I am getting the providers PA department. They run the patient's insurance individually. This tells them whether or not the patient's insurance requires a PA. She advises the patient to contact CS and ask for his benefit, to see if the sleep study is a covered benefit, but as far as it needing to have a PA it does not. Patient voiced his understanding and says that he will contact [facility] for his benefit information.

## 2018-03-26 ENCOUNTER — Ambulatory Visit (HOSPITAL_BASED_OUTPATIENT_CLINIC_OR_DEPARTMENT_OTHER): Payer: BLUE CROSS/BLUE SHIELD | Attending: Cardiovascular Disease | Admitting: Cardiovascular Disease

## 2018-03-26 VITALS — Ht 73.0 in | Wt 210.0 lb

## 2018-03-26 DIAGNOSIS — G4719 Other hypersomnia: Secondary | ICD-10-CM

## 2018-03-26 DIAGNOSIS — I2510 Atherosclerotic heart disease of native coronary artery without angina pectoris: Secondary | ICD-10-CM

## 2018-03-26 DIAGNOSIS — G4710 Hypersomnia, unspecified: Secondary | ICD-10-CM | POA: Diagnosis not present

## 2018-03-26 DIAGNOSIS — F5111 Primary hypersomnia: Secondary | ICD-10-CM | POA: Diagnosis not present

## 2018-03-26 DIAGNOSIS — R0683 Snoring: Secondary | ICD-10-CM | POA: Diagnosis not present

## 2018-03-26 DIAGNOSIS — R001 Bradycardia, unspecified: Secondary | ICD-10-CM

## 2018-03-26 DIAGNOSIS — R5383 Other fatigue: Secondary | ICD-10-CM

## 2018-04-05 ENCOUNTER — Encounter (HOSPITAL_BASED_OUTPATIENT_CLINIC_OR_DEPARTMENT_OTHER): Payer: Self-pay | Admitting: Cardiovascular Disease

## 2018-04-05 NOTE — Procedures (Signed)
Patient Name: Travis Jenkins, Travis Jenkins Date: 03/26/2018 Gender: Male D.O.B: 11-10-1950 Age (years): 3 Referring Provider: Dani Gobble Croitoru Height (inches): 73 Interpreting Physician: Shelva Majestic MD, ABSM Weight (lbs): 210 RPSGT: Earney [place] BMI: 28 MRN: [id number] Neck Size: 17.00  CLINICAL INFORMATION Sleep Study Type: NPSG  Indication for sleep study: Fatigue, Snoring  Epworth Sleepiness Score: 13  SLEEP STUDY TECHNIQUE As per the AASM Manual for the Scoring of Sleep and Associated Events v2.3 (April 2016) with a hypopnea requiring 4% desaturations.  The channels recorded and monitored were frontal, central and occipital EEG, electrooculogram (EOG), submentalis EMG (chin), nasal and oral airflow, thoracic and abdominal wall motion, anterior tibialis EMG, snore microphone, electrocardiogram, and pulse oximetry.  MEDICATIONS     acetaminophen (TYLENOL) 325 MG tablet         aspirin EC 81 MG tablet         HYDROcodone-acetaminophen (NORCO/VICODIN) 5-325 MG tablet         nitroGLYCERIN (NITROSTAT) 0.4 MG SL tablet         rosuvastatin (CRESTOR) 20 MG tablet         senna-docusate (SENOKOT-S) 8.6-50 MG tablet         ticagrelor (BRILINTA) 90 MG TABS tablet      Medications self-administered by patient taken the night of the study : [name] The study was initiated at 9:45:16 PM and ended at 4:31:23 AM.  Sleep onset time was 33.9 minutes and the sleep efficiency was 73.7%%. The total sleep time was 299.2 minutes.  Stage REM latency was 177.0 minutes.  The patient spent 3.5%% of the night in stage N1 sleep, 79.1%% in stage N2 sleep, 0.0%% in stage N3 and 17.4% in REM.  Alpha intrusion was absent.  Supine sleep was 81.71%.  RESPIRATORY PARAMETERS The overall apnea/hypopnea index (AHI) was 0.0 per hour.  The respiratory disturbance index (RDI) was 0.  There were 0 total apneas, including 0 obstructive, 0 central and 0 mixed apneas. There  were 0 hypopneas and 3 RERAs.  The AHI during Stage REM sleep was 0.0 per hour.  AHI while supine was 0.0 per hour.  The mean oxygen saturation was 93.1%. The minimum SpO2 during sleep was 91.0%.  Soft snoring was noted during this study.  CARDIAC DATA The 2 lead EKG demonstrated sinus rhythm. The mean heart rate was 50.6 beats per minute. Other EKG findings include: None.  LEG MOVEMENT DATA The total PLMS were 0 with a resulting PLMS index of 0.0. Associated arousal with leg movement index was 0.0 .  IMPRESSIONS - No significant obstructive sleep apnea occurred during this study (AHI 0.0/h). - No significant central sleep apnea occurred during this study (CAI 0.0/h). - No oxygen desaturation during the study (Min O2 91.0%) - The patient snored with soft snoring volume. - Abnormal sleep architecture with absence of sllow wave sleep anf prolonged latency to REM sleep. - No cardiac abnormalities were noted during this study. - Clinically significant periodic limb movements did not occur during sleep. No significant associated arousals.  DIAGNOSIS - Excessive daytinme sleepiness  RECOMMENDATIONS - No evidence for sleep disorded breathing. - Avoid alcohol, sedatives and other CNS depressants that may worsen sleep apnea and disrupt normal sleep architecture. - Sleep hygiene should be reviewed to assess factors that may improve sleep quality. - If patient continues to have excessive daytime sleepiness with normal sleep duration and good sleep hygiene consider an evaluation for idiopathic hypersomnolence or narcolepsy with a Multiple  Latency Sleep Test (PSG/MLST). - Weight management and regular exercise should be initiated or continued if appropriate.  [Electronically signed] 04/05/2018 04:16 PM  Shelva Majestic MD, [facility], ABSM Diplomate, American Board of Sleep Medicine   NPI: [id number] [place] PH: [phone]   FX: [phone] [place]

## 2018-04-07 ENCOUNTER — Telehealth: Payer: Self-pay | Admitting: *Deleted

## 2018-04-07 NOTE — Telephone Encounter (Signed)
Discussed results of study with patient.  He states he is not interested in MLST study at this time.   He states he just does not feel as if he sleeps as efficiently as he use to when he was younger.  He states he does dream, but wakes up multiple times, often times thinking about work.   He has upcoming appt with Dr. Sallyanne Kuster and will discuss results further with him at that time.   Aware Dr. Claiborne Billings available to discuss in the future if desired by patient or Dr. Sallyanne Kuster.  Patient verbalized understanding.

## 2018-04-07 NOTE — Telephone Encounter (Signed)
-----   Message from [place] Sine, MD sent at 04/05/2018  4:23 PM EDT ----- Mariann Laster Please notify pt of results; if significant daytime sleepiness perisits ? MLST  evaluation

## 2018-04-07 NOTE — Progress Notes (Signed)
See telephone note 8/21, patient aware and verbalized understanding.

## 2018-05-06 ENCOUNTER — Other Ambulatory Visit: Payer: Self-pay | Admitting: Physician Assistant

## 2018-05-13 ENCOUNTER — Ambulatory Visit: Payer: BLUE CROSS/BLUE SHIELD | Admitting: Cardiovascular Disease

## 2018-05-13 ENCOUNTER — Encounter: Payer: Self-pay | Admitting: Cardiovascular Disease

## 2018-05-13 VITALS — BP 132/74 | HR 51 | Ht 73.0 in | Wt 218.8 lb

## 2018-05-13 DIAGNOSIS — I2510 Atherosclerotic heart disease of native coronary artery without angina pectoris: Secondary | ICD-10-CM | POA: Diagnosis not present

## 2018-05-13 DIAGNOSIS — E7800 Pure hypercholesterolemia, unspecified: Secondary | ICD-10-CM

## 2018-05-13 NOTE — Patient Instructions (Signed)
Dr Sallyanne Kuster has recommended making the following medication changes: 1. STOP Brilinta  Your physician recommends that you schedule a follow-up appointment in 12 months. You will receive a reminder letter in the mail two months in advance. If you don't receive a letter, please call our office to schedule the follow-up appointment.  If you need a refill on your cardiac medications before your next appointment, please call your pharmacy.

## 2018-05-13 NOTE — Progress Notes (Signed)
Cardiology Office Note:    Date:  05/16/2018   ID:  Travis Jenkins, DOB [birth year], MRN [id number]  PCP:  Lujean Amel, Jenkins  Cardiologist:  Sanda Klein, Jenkins    Referring Jenkins: Lujean Amel, Jenkins   No chief complaint on file. CAD s/p LAD PCI-DES  History of Present Illness:    Travis Jenkins is a 67 y.o. male with a hx of recently diagnosed coronary artery disease presenting with unstable angina in September 2018 and receiving a drug-eluting stent to the proximal-mid LAD.   He feels well.  He is relatively sedentary, although he does walk his 2 dogs a couple of times a week.  He does not have exertional symptoms.  The patient specifically denies any chest pain with exertion, dyspnea at rest or with exertion, orthopnea, paroxysmal nocturnal dyspnea, syncope, palpitations, focal neurological deficits, intermittent claudication, lower extremity edema, unexplained weight gain, cough, hemoptysis or wheezing.  Past Medical History:  Diagnosis Date  . Bradycardia 05/01/2017  . CAD (coronary artery disease)   . Coronary artery disease   . IBS (irritable bowel syndrome)   . Raynaud's disease   . Syncope     Past Surgical History:  Procedure Laterality Date  . ANKLE FRACTURE SURGERY    . ANTERIOR CRUCIATE LIGAMENT REPAIR  1990  . CARDIAC CATHETERIZATION    . CHOLECYSTECTOMY N/A 02/17/2018   Procedure: LAPAROSCOPIC CHOLECYSTECTOMY;  Surgeon: Travis Jenkins;  Location: [facility];  Service: General;  Laterality: N/A;  . CORONARY STENT INTERVENTION N/A 05/01/2017   Procedure: CORONARY STENT INTERVENTION;  Surgeon: Travis Jenkins;  Location: [facility];  Service: Cardiovascular;  Laterality: N/A;  . INTRAVASCULAR PRESSURE WIRE/FFR STUDY N/A 05/01/2017   Procedure: INTRAVASCULAR PRESSURE WIRE/FFR STUDY;  Surgeon: Travis Jenkins;  Location: [facility];  Service: Cardiovascular;  Laterality: N/A;  . LEFT HEART CATH AND CORONARY ANGIOGRAPHY N/A 05/01/2017   Procedure: LEFT HEART CATH AND CORONARY ANGIOGRAPHY;  Surgeon: Travis Jenkins;  Location: [facility];  Service: Cardiovascular;  Laterality: N/A;    Current Medications: Current Meds  Medication Sig  . acetaminophen (TYLENOL) 325 MG tablet Take 325-650 mg by mouth every 6 (six) hours as needed (for pain or headaches).  Marland Kitchen aspirin EC 81 MG tablet Take 81 mg by mouth daily.  Marland Kitchen HYDROcodone-acetaminophen (NORCO/VICODIN) 5-325 MG tablet Take 1-2 tablets by mouth every 6 (six) hours as needed for moderate pain.  . nitroGLYCERIN (NITROSTAT) 0.4 MG SL tablet Place 1 tablet (0.4 mg total) under the tongue every 5 (five) minutes as needed for chest pain.  . rosuvastatin (CRESTOR) 20 MG tablet TAKE 1 TABLET BY MOUTH EVERY DAY  . senna-docusate (SENOKOT-S) 8.6-50 MG tablet Take 1 tablet by mouth at bedtime as needed for mild constipation.  . traZODone (DESYREL) 50 MG tablet TAKE 1/2 OR 1 TABLET AT BEDTIME ONCE A DAY  . [DISCONTINUED] BRILINTA 90 MG TABS tablet TAKE 1 TABLET (90 MG TOTAL) BY MOUTH 2 (TWO) TIMES DAILY.     Allergies:   Wasp venom; Erythromycin; and Tetracyclines & related   Social History   Socioeconomic History  . Marital status: Married    Spouse name: Not on file  . Number of children: Not on file  . Years of education: Not on file  . Highest education level: Not on file  Occupational History  . Not on file  Social Needs  . Financial resource strain: Not on file  . Food insecurity:  Worry: Not on file    Inability: Not on file  . Transportation needs:    Medical: Not on file    Non-medical: Not on file  Tobacco Use  . Smoking status: Former Smoker    Packs/day: 0.50    Years: 12.00    Pack years: 6.00    Types: Cigarettes    Start date: 06/20/1966    Last attempt to quit: 08/19/1971    Years since quitting: 46.7  . Smokeless tobacco: Never Used  Substance and Sexual Activity  . Alcohol use: Yes    Alcohol/week: 4.0 standard drinks    Types: 4 Cans of  beer per week  . Drug use: No  . Sexual activity: Not on file  Lifestyle  . Physical activity:    Days per week: Not on file    Minutes per session: Not on file  . Stress: Not on file  Relationships  . Social connections:    Talks on phone: Not on file    Gets together: Not on file    Attends religious service: Not on file    Active member of club or organization: Not on file    Attends meetings of clubs or organizations: Not on file    Relationship status: Not on file  Other Topics Concern  . Not on file  Social History Narrative  . Not on file     Family History: The patient's family history includes Diabetes in his father and mother; Heart disease in his father and mother; Hyperlipidemia in his father; Parkinson's disease in his mother. ROS:   Please see the history of present illness.     All other systems reviewed and are negative.  EKGs/Labs/Other Studies Reviewed:    The following studies were reviewed today: CT coronary angiography and report of angiogram and perched anus intervention procedure  EKG:  EKG is not ordered today.  Most recent ECG from February 12, 2018 shows mild sinus bradycardia, nonspecific ST segment changes  Recent Labs: 02/17/2018: ALT 36; BUN 15; Creatinine, Ser 1.17; Hemoglobin 15.5; Platelets 158; Potassium 4.1; Sodium 139  Recent Lipid Panel    Component Value Date/Time   CHOL 107 02/12/2018 0756   TRIG 135 02/12/2018 0756   HDL 40 (L) 02/12/2018 0756   CHOLHDL 2.7 02/12/2018 0756   VLDL 27 02/12/2018 0756   LDLCALC 40 02/12/2018 0756    Physical Exam:    VS:  BP 132/74   Pulse (!) 51   Ht 6\' 1"  (1.854 m)   Wt 218 lb 12.8 oz (99.2 kg)   BMI 28.87 kg/m     Wt Readings from Last 3 Encounters:  05/13/18 218 lb 12.8 oz (99.2 kg)  03/26/18 210 lb (95.3 kg)  02/18/18 215 lb 6.2 oz (97.7 kg)      General: Alert, oriented x3, no distress, highly overweight, but appears generally fit Head: no evidence of trauma, PERRL, EOMI, no  exophtalmos or lid lag, no myxedema, no xanthelasma; normal ears, nose and oropharynx Neck: normal jugular venous pulsations and no hepatojugular reflux; brisk carotid pulses without delay and no carotid bruits Chest: clear to auscultation, no signs of consolidation by percussion or palpation, normal fremitus, symmetrical and full respiratory excursions Cardiovascular: normal position and quality of the apical impulse, regular rhythm, normal first and second heart sounds, no murmurs, rubs or gallops Abdomen: no tenderness or distention, no masses by palpation, no abnormal pulsatility or arterial bruits, normal bowel sounds, no hepatosplenomegaly Extremities: no clubbing, cyanosis or  edema; 2+ radial, ulnar and brachial pulses bilaterally; 2+ right femoral, posterior tibial and dorsalis pedis pulses; 2+ left femoral, posterior tibial and dorsalis pedis pulses; no subclavian or femoral bruits Neurological: grossly nonfocal Psych: Normal mood and affect   ASSESSMENT:    1. Coronary artery disease involving native coronary artery of native heart without angina pectoris   2. Hypercholesterolemia    PLAN:    In order of problems listed above:  1. CAD: No recurrence of angina or other cardiovascular complaints 12 months since stent implantation.  Discontinue Brilinta.  Discussed the importance of continued risk factor modification and in his case in particular the need to increase physical activity.  Other risk factors are well addressed. 2. HLP: On high-dose effective statin therapy with LDL cholesterol well within target range.   Medication Adjustments/Labs and Tests Ordered: Current medicines are reviewed at length with the patient today.  Concerns regarding medicines are outlined above.  No orders of the defined types were placed in this encounter.  No orders of the defined types were placed in this encounter.   Signed, Sanda Klein, Jenkins  05/16/2018 1:59 PM    [facility]

## 2018-05-16 ENCOUNTER — Encounter: Payer: Self-pay | Admitting: Cardiovascular Disease

## 2018-05-16 MED ORDER — ROSUVASTATIN CALCIUM 20 MG PO TABS: 20.0000 mg | ORAL_TABLET | Freq: Every day | ORAL | 3 refills | Status: DC

## 2018-09-27 ENCOUNTER — Other Ambulatory Visit: Payer: Self-pay | Admitting: Cardiovascular Disease

## 2018-09-27 ENCOUNTER — Telehealth: Payer: Self-pay | Admitting: Cardiovascular Disease

## 2018-09-27 DIAGNOSIS — R0789 Other chest pain: Secondary | ICD-10-CM

## 2018-09-27 MED ORDER — NITROGLYCERIN 0.4 MG SL SUBL: 0.4000 mg | SUBLINGUAL_TABLET | SUBLINGUAL | 3 refills | Status: DC | PRN

## 2018-09-27 NOTE — Telephone Encounter (Signed)
Spoke with pt and has noted chest pain only occurring at rest for about 2 months no other symptoms The last episode occurred over weekend when went to bed and kept pt up for about an hour and then felt twinge like pain the following day Per pt has been exercising and reaching target heart rate and no symptoms with activity Will forward to Dr Sallyanne Kuster for review and recommendations ./cy

## 2018-09-27 NOTE — Telephone Encounter (Signed)
New Message    *STAT* If patient is at the pharmacy, call can be transferred to refill team.   1. Which medications need to be refilled? (please list name of each medication and dose if known) nitroGLYCERIN (NITROSTAT) 0.4 MG SL tablet    2. Which pharmacy/location (including street and city if local pharmacy) is medication to be sent to?[id number] [place], [place] - [address]  3. Do they need a 30 day or 90 day supply? [place]

## 2018-09-27 NOTE — Telephone Encounter (Signed)
Pt aware of recommendations . GXT ordered and schedulers will call pt with a appt ./cy

## 2018-09-27 NOTE — Addendum Note (Signed)
Addended by: Diana Eves on: 09/27/2018 09:15 AM   Modules accepted: Orders

## 2018-09-27 NOTE — Telephone Encounter (Signed)
New Message   Pt c/o of Chest Pain: STAT if CP now or developed within 24 hours  1. Are you having CP right now? No   2. Are you experiencing any other symptoms (ex. SOB, nausea, vomiting, sweating)? Sweating   3. How long have you been experiencing CP? A few months  4. Is your CP continuous or coming and going? Coming and going   5. Have you taken Nitroglycerin? No    Patient states that the chest pain only occurs at night when he is a rest lasting about 1.5 hours. Its not all the time maybe twice a week. Please call to discuss.  ?

## 2018-09-27 NOTE — Telephone Encounter (Signed)
Chest pain that occurs at rest repeatedly, but does not occur with activity is unlikely to be related to his heart.  Does the location and quality of the pain reminded him of what it felt like before his heart catheterization? It has been about a year and a half since his stent. Problems with the stent at this point would be unlikely, although not impossible.  It is reasonable to bring him in for a plain treadmill ECG stress test. MCr

## 2018-09-27 NOTE — Telephone Encounter (Signed)
Rx(s) sent to pharmacy electronically.  

## 2018-09-30 ENCOUNTER — Telehealth (HOSPITAL_COMMUNITY): Payer: Self-pay

## 2018-09-30 NOTE — Telephone Encounter (Signed)
Encounter complete. 

## 2018-10-05 ENCOUNTER — Encounter (HOSPITAL_COMMUNITY): Payer: Self-pay | Admitting: *Deleted

## 2018-10-05 ENCOUNTER — Ambulatory Visit (HOSPITAL_COMMUNITY)
Admission: RE | Admit: 2018-10-05 | Discharge: 2018-10-05 | Disposition: A | Discharge status: Home / Self Care | Payer: BLUE CROSS/BLUE SHIELD | Source: Ambulatory Visit | Attending: Cardiovascular Disease | Admitting: Cardiovascular Disease

## 2018-10-05 DIAGNOSIS — R0789 Other chest pain: Secondary | ICD-10-CM | POA: Diagnosis present

## 2018-10-05 NOTE — Progress Notes (Signed)
ETT reviewed by Dr Gwenlyn Found and he stated that pt may leave.

## 2018-10-06 LAB — EXERCISE TOLERANCE TEST
Estimated workload: 14.2 METS
Exercise duration (min): 12 min
Exercise duration (sec): 27 s
MPHR: 154 {beats}/min
Peak HR: 166 {beats}/min
Percent HR: 107 %
RPE: 17
Rest HR: 62 {beats}/min

## 2019-01-26 ENCOUNTER — Telehealth: Payer: Self-pay | Admitting: *Deleted

## 2019-01-26 NOTE — Telephone Encounter (Signed)
   [facility] Pre-operative Risk Assessment    Request for surgical clearance:  1. What type of surgery is being performed? RIGHT LONG FINGER EXCISION MASS   2. When is this surgery scheduled? 02/07/19   3. What type of clearance is required (medical clearance vs. Pharmacy clearance to hold med vs. Both)? MEDICAL  4. Are there any medications that need to be held prior to surgery and how long? ASA    5. Practice name and name of physician performing surgery? [facility]; DR. Fredna Dow    6. What is your office phone number [phone]    7.   What is your office fax number [phone]  8.   Anesthesia type (None, local, MAC, general) ? CHOICE    Travis Jenkins 01/26/2019, 12:29 PM  _________________________________________________________________   (provider comments below)

## 2019-01-26 NOTE — Telephone Encounter (Signed)
   Primary Cardiologist: Sanda Klein, MD  Chart reviewed as part of pre-operative protocol coverage. Patient was contacted 01/26/2019 in reference to pre-operative risk assessment for pending surgery as outlined below.  Travis Jenkins was last seen on 09/27/18 by Dr. Sallyanne Kuster.  Since that day, Travis Jenkins has done well without anginal symptomatology.   Therefore, based on ACC/AHA guidelines, the patient would be at acceptable risk for the planned procedure without further cardiovascular testing.   Ok to hold ASA 5-7 days prior to surgery.   I will route this recommendation to the requesting party via Epic fax function and remove from pre-op pool.  Please call with questions.  Lyda Jester, PA-C 01/26/2019, 2:10 PM

## 2019-01-31 ENCOUNTER — Other Ambulatory Visit: Payer: Self-pay | Admitting: Orthopedic Surgery

## 2019-02-01 ENCOUNTER — Other Ambulatory Visit: Payer: Self-pay

## 2019-02-01 ENCOUNTER — Encounter (HOSPITAL_BASED_OUTPATIENT_CLINIC_OR_DEPARTMENT_OTHER): Payer: Self-pay | Admitting: *Deleted

## 2019-02-03 ENCOUNTER — Other Ambulatory Visit (HOSPITAL_COMMUNITY)
Admission: RE | Admit: 2019-02-03 | Discharge: 2019-02-03 | Disposition: A | Discharge status: Home / Self Care | Payer: BC Managed Care – PPO | Source: Ambulatory Visit | Attending: Orthopedic Surgery | Admitting: Orthopedic Surgery

## 2019-02-03 DIAGNOSIS — Z1159 Encounter for screening for other viral diseases: Secondary | ICD-10-CM | POA: Diagnosis not present

## 2019-02-04 LAB — SARS CORONAVIRUS 2 (TAT 6-24 HRS): SARS Coronavirus 2: NEGATIVE

## 2019-02-07 ENCOUNTER — Ambulatory Visit (HOSPITAL_BASED_OUTPATIENT_CLINIC_OR_DEPARTMENT_OTHER): Payer: BC Managed Care – PPO | Admitting: Anesthesiology

## 2019-02-07 ENCOUNTER — Ambulatory Visit (HOSPITAL_BASED_OUTPATIENT_CLINIC_OR_DEPARTMENT_OTHER)
Admission: RE | Admit: 2019-02-07 | Discharge: 2019-02-07 | Disposition: A | Discharge status: Home / Self Care | Payer: BC Managed Care – PPO | Attending: Orthopedic Surgery | Admitting: Orthopedic Surgery

## 2019-02-07 ENCOUNTER — Encounter (HOSPITAL_BASED_OUTPATIENT_CLINIC_OR_DEPARTMENT_OTHER): Payer: Self-pay | Admitting: Anesthesiology

## 2019-02-07 ENCOUNTER — Other Ambulatory Visit: Payer: Self-pay

## 2019-02-07 ENCOUNTER — Encounter (HOSPITAL_BASED_OUTPATIENT_CLINIC_OR_DEPARTMENT_OTHER)
Admission: RE | Disposition: A | Discharge status: Home / Self Care | Payer: Self-pay | Source: Home / Self Care | Attending: Orthopedic Surgery

## 2019-02-07 DIAGNOSIS — Z79899 Other long term (current) drug therapy: Secondary | ICD-10-CM | POA: Diagnosis not present

## 2019-02-07 DIAGNOSIS — B079 Viral wart, unspecified: Secondary | ICD-10-CM | POA: Insufficient documentation

## 2019-02-07 DIAGNOSIS — I2510 Atherosclerotic heart disease of native coronary artery without angina pectoris: Secondary | ICD-10-CM | POA: Insufficient documentation

## 2019-02-07 DIAGNOSIS — I252 Old myocardial infarction: Secondary | ICD-10-CM | POA: Diagnosis not present

## 2019-02-07 DIAGNOSIS — B359 Dermatophytosis, unspecified: Secondary | ICD-10-CM | POA: Insufficient documentation

## 2019-02-07 DIAGNOSIS — Z87891 Personal history of nicotine dependence: Secondary | ICD-10-CM | POA: Insufficient documentation

## 2019-02-07 DIAGNOSIS — I7300 Raynaud's syndrome without gangrene: Secondary | ICD-10-CM | POA: Diagnosis not present

## 2019-02-07 DIAGNOSIS — Z955 Presence of coronary angioplasty implant and graft: Secondary | ICD-10-CM | POA: Diagnosis not present

## 2019-02-07 DIAGNOSIS — R2231 Localized swelling, mass and lump, right upper limb: Secondary | ICD-10-CM | POA: Diagnosis present

## 2019-02-07 DIAGNOSIS — Z7982 Long term (current) use of aspirin: Secondary | ICD-10-CM | POA: Diagnosis not present

## 2019-02-07 HISTORY — PX: MASS EXCISION: SHX2000

## 2019-02-07 SURGERY — EXCISION MASS
Anesthesia: Regional | Site: Hand | Laterality: Right

## 2019-02-07 MED ORDER — OXYCODONE HCL 5 MG PO TABS: 5.0000 mg | ORAL_TABLET | Freq: Once | ORAL | Status: DC | PRN

## 2019-02-07 MED ORDER — FENTANYL CITRATE (PF) 100 MCG/2ML IJ SOLN
50.0000 ug | INTRAMUSCULAR | Status: DC | PRN
  Administered 2019-02-07: 09:00:00 50 ug via INTRAVENOUS

## 2019-02-07 MED ORDER — LIDOCAINE HCL (PF) 0.5 % IJ SOLN
INTRAMUSCULAR | Status: DC | PRN
  Administered 2019-02-07: 30 mL via INTRAVENOUS

## 2019-02-07 MED ORDER — ONDANSETRON HCL 4 MG/2ML IJ SOLN: 4.0000 mg | Freq: Once | INTRAMUSCULAR | Status: DC | PRN

## 2019-02-07 MED ORDER — SCOPOLAMINE 1 MG/3DAYS TD PT72: 1.0000 | MEDICATED_PATCH | Freq: Once | TRANSDERMAL | Status: DC

## 2019-02-07 MED ORDER — MIDAZOLAM HCL 2 MG/2ML IJ SOLN
1.0000 mg | INTRAMUSCULAR | Status: DC | PRN
  Administered 2019-02-07 (×2): 1 mg via INTRAVENOUS

## 2019-02-07 MED ORDER — MIDAZOLAM HCL 2 MG/2ML IJ SOLN
INTRAMUSCULAR | Status: AC
  Filled 2019-02-07: qty 2

## 2019-02-07 MED ORDER — OXYCODONE HCL 5 MG/5ML PO SOLN: 5.0000 mg | Freq: Once | ORAL | Status: DC | PRN

## 2019-02-07 MED ORDER — LIDOCAINE 2% (20 MG/ML) 5 ML SYRINGE
INTRAMUSCULAR | Status: AC
  Filled 2019-02-07: qty 5

## 2019-02-07 MED ORDER — LACTATED RINGERS IV SOLN
INTRAVENOUS | Status: DC
  Administered 2019-02-07: 08:00:00 via INTRAVENOUS

## 2019-02-07 MED ORDER — ONDANSETRON HCL 4 MG/2ML IJ SOLN
INTRAMUSCULAR | Status: AC
  Filled 2019-02-07: qty 2

## 2019-02-07 MED ORDER — FENTANYL CITRATE (PF) 100 MCG/2ML IJ SOLN
INTRAMUSCULAR | Status: AC
  Filled 2019-02-07: qty 2

## 2019-02-07 MED ORDER — PROPOFOL 500 MG/50ML IV EMUL
INTRAVENOUS | Status: DC | PRN
  Administered 2019-02-07: 75 ug/kg/min via INTRAVENOUS

## 2019-02-07 MED ORDER — CEFAZOLIN SODIUM-DEXTROSE 2-4 GM/100ML-% IV SOLN
INTRAVENOUS | Status: AC
  Filled 2019-02-07: qty 100

## 2019-02-07 MED ORDER — BUPIVACAINE HCL (PF) 0.25 % IJ SOLN
INTRAMUSCULAR | Status: DC | PRN
  Administered 2019-02-07: 10 mL

## 2019-02-07 MED ORDER — FENTANYL CITRATE (PF) 100 MCG/2ML IJ SOLN: 25.0000 ug | INTRAMUSCULAR | Status: DC | PRN

## 2019-02-07 MED ORDER — CHLORHEXIDINE GLUCONATE 4 % EX LIQD: 60.0000 mL | Freq: Once | CUTANEOUS | Status: DC

## 2019-02-07 MED ORDER — CEFAZOLIN SODIUM-DEXTROSE 2-4 GM/100ML-% IV SOLN
2.0000 g | INTRAVENOUS | Status: AC
  Administered 2019-02-07: 2 g via INTRAVENOUS

## 2019-02-07 MED ORDER — HYDROCODONE-ACETAMINOPHEN 5-325 MG PO TABS: ORAL_TABLET | ORAL | 0 refills | Status: DC

## 2019-02-07 SURGICAL SUPPLY — 53 items
BANDAGE ACE 3X5.8 VEL STRL LF (GAUZE/BANDAGES/DRESSINGS) IMPLANT
BENZOIN TINCTURE PRP APPL 2/3 (GAUZE/BANDAGES/DRESSINGS) IMPLANT
BLADE MINI RND TIP GREEN BEAV (BLADE) IMPLANT
BLADE SURG 15 STRL LF DISP TIS (BLADE) ×2 IMPLANT
BLADE SURG 15 STRL SS (BLADE) ×2
BNDG COHESIVE 1X5 TAN STRL LF (GAUZE/BANDAGES/DRESSINGS) IMPLANT
BNDG COHESIVE 2X5 TAN STRL LF (GAUZE/BANDAGES/DRESSINGS) IMPLANT
BNDG CONFORM 2 STRL LF (GAUZE/BANDAGES/DRESSINGS) IMPLANT
BNDG ELASTIC 2X5.8 VLCR STR LF (GAUZE/BANDAGES/DRESSINGS) IMPLANT
BNDG ESMARK 4X9 LF (GAUZE/BANDAGES/DRESSINGS) ×1 IMPLANT
BNDG GAUZE 1X2.1 STRL (MISCELLANEOUS) IMPLANT
BNDG GAUZE ELAST 4 BULKY (GAUZE/BANDAGES/DRESSINGS) IMPLANT
BNDG PLASTER X FAST 3X3 WHT LF (CAST SUPPLIES) IMPLANT
CHLORAPREP W/TINT 26 (MISCELLANEOUS) ×2 IMPLANT
CORD BIPOLAR FORCEPS 12FT (ELECTRODE) ×2 IMPLANT
COVER BACK TABLE REUSABLE LG (DRAPES) ×2 IMPLANT
COVER MAYO STAND REUSABLE (DRAPES) ×2 IMPLANT
COVER WAND RF STERILE (DRAPES) IMPLANT
CUFF TOURN SGL QUICK 18X4 (TOURNIQUET CUFF) ×2 IMPLANT
DRAPE EXTREMITY T 121X128X90 (DISPOSABLE) ×2 IMPLANT
DRAPE SURG 17X23 STRL (DRAPES) ×2 IMPLANT
GAUZE SPONGE 4X4 12PLY STRL (GAUZE/BANDAGES/DRESSINGS) ×2 IMPLANT
GAUZE XEROFORM 1X8 LF (GAUZE/BANDAGES/DRESSINGS) ×2 IMPLANT
GLOVE BIO SURGEON STRL SZ7.5 (GLOVE) ×2 IMPLANT
GLOVE BIOGEL PI IND STRL 7.0 (GLOVE) IMPLANT
GLOVE BIOGEL PI IND STRL 8 (GLOVE) ×1 IMPLANT
GLOVE BIOGEL PI INDICATOR 7.0 (GLOVE) ×2
GLOVE BIOGEL PI INDICATOR 8 (GLOVE) ×1
GLOVE ECLIPSE 6.5 STRL STRAW (GLOVE) ×1 IMPLANT
GOWN STRL REUS W/ TWL LRG LVL3 (GOWN DISPOSABLE) ×1 IMPLANT
GOWN STRL REUS W/TWL LRG LVL3 (GOWN DISPOSABLE) ×1
GOWN STRL REUS W/TWL XL LVL3 (GOWN DISPOSABLE) ×2 IMPLANT
NDL HYPO 25X1 1.5 SAFETY (NEEDLE) ×1 IMPLANT
NEEDLE HYPO 25X1 1.5 SAFETY (NEEDLE) ×2 IMPLANT
NS IRRIG 1000ML POUR BTL (IV SOLUTION) ×2 IMPLANT
PACK BASIN DAY SURGERY FS (CUSTOM PROCEDURE TRAY) ×2 IMPLANT
PAD CAST 3X4 CTTN HI CHSV (CAST SUPPLIES) IMPLANT
PAD CAST 4YDX4 CTTN HI CHSV (CAST SUPPLIES) IMPLANT
PADDING CAST ABS 4INX4YD NS (CAST SUPPLIES) ×1
PADDING CAST ABS COTTON 4X4 ST (CAST SUPPLIES) ×1 IMPLANT
PADDING CAST COTTON 3X4 STRL (CAST SUPPLIES)
PADDING CAST COTTON 4X4 STRL (CAST SUPPLIES)
STOCKINETTE 4X48 STRL (DRAPES) ×2 IMPLANT
STRIP CLOSURE SKIN 1/2X4 (GAUZE/BANDAGES/DRESSINGS) IMPLANT
SUT ETHILON 3 0 PS 1 (SUTURE) IMPLANT
SUT ETHILON 4 0 PS 2 18 (SUTURE) ×2 IMPLANT
SUT ETHILON 5 0 P 3 18 (SUTURE)
SUT NYLON ETHILON 5-0 P-3 1X18 (SUTURE) IMPLANT
SUT VIC AB 4-0 P2 18 (SUTURE) IMPLANT
SYR BULB 3OZ (MISCELLANEOUS) ×2 IMPLANT
SYR CONTROL 10ML LL (SYRINGE) ×2 IMPLANT
TOWEL GREEN STERILE FF (TOWEL DISPOSABLE) ×3 IMPLANT
UNDERPAD 30X30 (UNDERPADS AND DIAPERS) ×2 IMPLANT

## 2019-02-07 NOTE — Discharge Instructions (Addendum)

## 2019-02-07 NOTE — H&P (Signed)
Travis Jenkins is an 68 y.o. male.   Chief Complaint: right long finger mass HPI: 68 yo male with mass at cuticle right long finger.  It is painful when he catches it on things.  He wishes to have it removed.  Allergies:  Allergies  Allergen Reactions  . 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 Erythromycin Rash  . Tetracyclines & Related Rash    Past Medical History:  Diagnosis Date  . Bradycardia 05/01/2017  . CAD (coronary artery disease)   . Coronary artery disease   . IBS (irritable bowel syndrome)   . Raynaud's disease   . Syncope     Past Surgical History:  Procedure Laterality Date  . ANKLE FRACTURE SURGERY    . ANTERIOR CRUCIATE LIGAMENT REPAIR  1990  . CARDIAC CATHETERIZATION    . CHOLECYSTECTOMY N/A 02/17/2018   Procedure: LAPAROSCOPIC CHOLECYSTECTOMY;  Surgeon: Rolm Bookbinder, MD;  Location: [facility];  Service: General;  Laterality: N/A;  . CORONARY STENT INTERVENTION N/A 05/01/2017   Procedure: CORONARY STENT INTERVENTION;  Surgeon: Nelva Bush, MD;  Location: [facility];  Service: Cardiovascular;  Laterality: N/A;  . INTRAVASCULAR PRESSURE WIRE/FFR STUDY N/A 05/01/2017   Procedure: INTRAVASCULAR PRESSURE WIRE/FFR STUDY;  Surgeon: Nelva Bush, MD;  Location: [facility];  Service: Cardiovascular;  Laterality: N/A;  . LEFT HEART CATH AND CORONARY ANGIOGRAPHY N/A 05/01/2017   Procedure: LEFT HEART CATH AND CORONARY ANGIOGRAPHY;  Surgeon: Nelva Bush, MD;  Location: [facility];  Service: Cardiovascular;  Laterality: N/A;    Family History: Family History  Problem Relation Age of Onset  . Hyperlipidemia Father   . Diabetes Father   . Heart disease Father   . Diabetes Mother   . Parkinson's disease Mother   . Heart disease Mother     Social History:   reports that he quit smoking about 47 years ago. His smoking use included cigarettes. He started smoking about 52 years ago. He has a 6.00  pack-year smoking history. He has never used smokeless tobacco. He reports current alcohol use of about 4.0 standard drinks of alcohol per week. He reports that he does not use drugs.  Medications: Medications Prior to Admission  Medication Sig Dispense Refill  . aspirin EC 81 MG tablet Take 81 mg by mouth daily.    . rosuvastatin (CRESTOR) 20 MG tablet Take 1 tablet (20 mg total) by mouth daily. 90 tablet 3  . acetaminophen (TYLENOL) 325 MG tablet Take 325-650 mg by mouth every 6 (six) hours as needed (for pain or headaches).    . nitroGLYCERIN (NITROSTAT) 0.4 MG SL tablet Place 1 tablet (0.4 mg total) under the tongue every 5 (five) minutes as needed for chest pain. 25 tablet 3  . traZODone (DESYREL) 50 MG tablet TAKE 1/2 OR 1 TABLET AT BEDTIME ONCE A DAY  2    No results found for this or any previous visit (from the past 48 hour(s)).  No results found.   A comprehensive review of systems was negative.  Blood pressure 134/84, pulse (!) 48, temperature 98.3 F (36.8 C), temperature source Oral, resp. rate 18, height 6\' 1"  (1.854 m), weight 91 kg, SpO2 100 %.  General appearance: alert, cooperative and appears stated age Head: Normocephalic, without obvious abnormality, atraumatic Neck: supple, symmetrical, trachea midline Cardio: regular rate and rhythm Resp: clear to auscultation bilaterally Extremities: Intact sensation and capillary refill all digits.  +epl/fpl/io.  No wounds.  Pulses: 2+ and symmetric Skin: Skin color, texture, turgor normal. No rashes or lesions Neurologic: Grossly normal Incision/Wound: none  Assessment/Plan Right long finger mass at nail fold.  Non operative and operative treatment options have been discussed with the patient and patient wishes to proceed with operative treatment. Risks, benefits, and alternatives of surgery have been discussed and the patient agrees with the plan of care.   Leanora Cover 02/07/2019, 9:21 AM

## 2019-02-07 NOTE — Op Note (Addendum)
NAME: Travis Jenkins RECORD NO: [id number] DATE OF BIRTH: 05-01-1951 FACILITY: [facility] LOCATION: [facility] PHYSICIAN: Tennis Must, MD   OPERATIVE REPORT   DATE OF PROCEDURE: 02/07/19    PREOPERATIVE DIAGNOSIS:   Right long finger of dorsal nail fold mass   POSTOPERATIVE DIAGNOSIS:   Right long finger dorsal nail fold mass   PROCEDURE:  Right long finger excision dorsal nail fold mass ~ 0.5cm x 0.7cm   SURGEON:  Leanora Cover, M.D.   ASSISTANT: none   ANESTHESIA:  Bier block with sedation   INTRAVENOUS FLUIDS:  Per anesthesia flow sheet.   ESTIMATED BLOOD LOSS:  Minimal.   COMPLICATIONS:  None.   SPECIMENS:   Right long finger mass to pathology   TOURNIQUET TIME:    Total Tourniquet Time Documented: Forearm (Right) - 24 minutes Total: Forearm (Right) - 24 minutes    DISPOSITION:  Stable to PACU.   INDICATIONS: [age]-year-old male with mass on dorsal nail fold right long finger.  This is bothersome to him.  He wishes to have it removed. Risks, benefits and alternatives of surgery were discussed including the risks of blood loss, infection, damage to nerves, vessels, tendons, ligaments, bone for surgery, need for additional surgery, complications with wound healing, continued pain, stiffness, recurrence of mass.  He voiced understanding of these risks and elected to proceed.  OPERATIVE COURSE:  After being identified preoperatively by myself,  the patient and I agreed on the procedure and site of the procedure.  The surgical site was marked.  Surgical consent had been signed. He was given IV antibiotics as preoperative antibiotic prophylaxis. He was transferred to the operating room and placed on the operating table in supine position with the Right upper extremity on an arm board.  Bier block anesthesia was induced by the anesthesiologist.  Right upper extremity was prepped and draped in normal sterile orthopedic fashion.  A surgical pause was  performed between the surgeons, anesthesia, and operating room staff and all were in agreement as to the patient, procedure, and site of procedure.  Tourniquet at the proximal aspect of the forearm had been inflated for the Bier block.    The nail was removed with a freer elevator.  Mass was easily identified in the dorsal nail fold.  A wedge type excision was performed.  The mass was sent to pathology for examination.  No remaining mass was noted.  Bipolar cautery was used to obtain hemostasis.  Bipolar electrocautery was used to obtain hemostasis.  The wound was copiously irrigated with sterile saline.  Was able to be closed with a 5-0 Monocryl suture in an interrupted fashion with good reapproximation of soft tissues.  Xeroform is placed the nail fold and the wound dressed with sterile Xeroform 4 x 4 and wrapped with a Coban dressing lightly.  An AlumaFoam splint was placed and wrapped lightly with Coban dressing.  The tourniquet was deflated at 24 minutes.  Fingertips were pink with brisk capillary refill after deflation of tourniquet.  The operative  drapes were broken down.  The patient was awoken from anesthesia safely.  He was transferred back to the stretcher and taken to PACU in stable condition.  I will see him back in the office in 1 week for postoperative followup.  I will give him a prescription for Norco 5/325 1-2 tabs PO q6 hours prn pain, dispense # 20.   Leanora Cover, MD Electronically signed, 02/07/19  Addendum (02/10/19): added size of mass.

## 2019-02-07 NOTE — Anesthesia Postprocedure Evaluation (Signed)
Anesthesia Post Note  Patient: Travis Jenkins  Procedure(s) Performed: RIGHT LONG FINGER EXCISION MASS (Right Hand)     Patient location during evaluation: PACU Anesthesia Type: Bier Block Level of consciousness: awake and alert Pain management: pain level controlled Vital Signs Assessment: post-procedure vital signs reviewed and stable Respiratory status: spontaneous breathing, nonlabored ventilation and respiratory function stable Cardiovascular status: blood pressure returned to baseline and stable Postop Assessment: no apparent nausea or vomiting Anesthetic complications: no    Last Vitals:  Vitals:   02/07/19 1003 02/07/19 1015  BP: 138/81 139/82  Pulse: (!) 45 (!) 44  Resp: 14 17  Temp: (!) 36.4 C   SpO2: 100% 98%    Last Pain:  Vitals:   02/07/19 1003  TempSrc:   PainSc: 0-No pain                 Lidia Collum

## 2019-02-07 NOTE — Transfer of Care (Signed)
Immediate Anesthesia Transfer of Care Note  Patient: Travis Jenkins  Procedure(s) Performed: RIGHT LONG FINGER EXCISION MASS (Right Hand)  Patient Location: PACU  Anesthesia Type:Bier block  Level of Consciousness: awake, sedated and patient cooperative  Airway & Oxygen Therapy: Patient Spontanous Breathing and Patient connected to nasal cannula oxygen  Post-op Assessment: Report given to RN and Post -op Vital signs reviewed and stable  Post vital signs: Reviewed and stable  Last Vitals:  Vitals Value Taken Time  BP    Temp    Pulse    Resp    SpO2      Last Pain:  Vitals:   02/07/19 0818  TempSrc: Oral  PainSc: 0-No pain         Complications: No apparent anesthesia complications

## 2019-02-07 NOTE — Anesthesia Preprocedure Evaluation (Addendum)
Anesthesia Evaluation  Patient identified by MRN, date of birth, ID band Patient awake    Reviewed: Allergy & Precautions, NPO status , Patient's Chart, lab work & pertinent test results  History of Anesthesia Complications Negative for: history of anesthetic complications  Airway Mallampati: I  TM Distance: >3 FB Neck ROM: Full    Dental  (+) Teeth Intact   Pulmonary neg pulmonary ROS, former smoker,    Pulmonary exam normal        Cardiovascular + CAD, + Past MI (2018) and + Cardiac Stents  Normal cardiovascular exam     Neuro/Psych negative neurological ROS  negative psych ROS   GI/Hepatic negative GI ROS, Neg liver ROS,   Endo/Other  negative endocrine ROS  Renal/GU Renal InsufficiencyRenal disease  negative genitourinary   Musculoskeletal negative musculoskeletal ROS (+)   Abdominal   Peds  Hematology negative hematology ROS (+)   Anesthesia Other Findings Neg stress test 02/20  Reproductive/Obstetrics                            Anesthesia Physical Anesthesia Plan  ASA: III  Anesthesia Plan: Bier Block and Bier Block-LIDOCAINE ONLY   Post-op Pain Management:    Induction:   PONV Risk Score and Plan: 1 and Propofol infusion and Treatment may vary due to age or medical condition  Airway Management Planned: Natural Airway, Nasal Cannula and Simple Face Mask  Additional Equipment: None  Intra-op Plan:   Post-operative Plan:   Informed Consent: I have reviewed the patients History and Physical, chart, labs and discussed the procedure including the risks, benefits and alternatives for the proposed anesthesia with the patient or authorized representative who has indicated his/her understanding and acceptance.       Plan Discussed with:   Anesthesia Plan Comments:        Anesthesia Quick Evaluation

## 2019-02-08 ENCOUNTER — Encounter (HOSPITAL_BASED_OUTPATIENT_CLINIC_OR_DEPARTMENT_OTHER): Payer: Self-pay | Admitting: Orthopedic Surgery

## 2019-05-10 ENCOUNTER — Telehealth: Payer: Self-pay | Admitting: *Deleted

## 2019-05-10 NOTE — Telephone Encounter (Signed)
Left a message for the patient to call back to confirm the virtual appointment on 9/24 with Dr. Sallyanne Kuster.

## 2019-05-11 ENCOUNTER — Telehealth: Payer: Self-pay | Admitting: *Deleted

## 2019-05-11 NOTE — Telephone Encounter (Signed)
Virtual Visit Pre-Appointment Phone Call  "(Name), I am calling you today to discuss your upcoming appointment. We are currently trying to limit exposure to the virus that causes COVID-19 by seeing patients at home rather than in the office."  1. "What is the BEST phone number to call the day of the visit?" - include this in appointment notes  2. "Do you have or have access to (through a family member/friend) a smartphone with video capability that we can use for your visit?" a. If yes - list this number in appt notes as "cell" (if different from BEST phone #) and list the appointment type as a VIDEO visit in appointment notes b. If no - list the appointment type as a PHONE visit in appointment notes  3. Confirm consent - "In the setting of the current Covid19 crisis, you are scheduled for a (phone or video) visit with your provider on (date) at (time).  Just as we do with many in-office visits, in order for you to participate in this visit, we must obtain consent.  If you'd like, I can send this to your mychart (if signed up) or email for you to review.  Otherwise, I can obtain your verbal consent now.  All virtual visits are billed to your insurance company just like a normal visit would be.  By agreeing to a virtual visit, we'd like you to understand that the technology does not allow for your provider to perform an examination, and thus may limit your provider's ability to fully assess your condition. If your provider identifies any concerns that need to be evaluated in person, we will make arrangements to do so.  Finally, though the technology is pretty good, we cannot assure that it will always work on either your or our end, and in the setting of a video visit, we may have to convert it to a phone-only visit.  In either situation, we cannot ensure that we have a secure connection.  Are you willing to proceed?" YES  4. Advise patient to be prepared - "Two hours prior to your appointment, go  ahead and check your blood pressure, pulse, oxygen saturation, and your weight (if you have the equipment to check those) and write them all down. When your visit starts, your provider will ask you for this information. If you have an Apple Watch or Kardia device, please plan to have heart rate information ready on the day of your appointment. Please have a pen and paper handy nearby the day of the visit as well."  5. Give patient instructions for MyChart download to smartphone OR [email or web address] as below if video visit (depending on what platform provider is using)  6. Inform patient they will receive a phone call 15 minutes prior to their appointment time (may be from unknown caller ID) so they should be prepared to answer    TELEPHONE CALL NOTE  KALETH JACKS has been deemed a candidate for a follow-up tele-health visit to limit community exposure during the Covid-19 pandemic. I spoke with the patient via phone to ensure availability of phone/video source, confirm preferred email & phone number, and discuss instructions and expectations.  I reminded SELENA RUBANO to be prepared with any vital sign and/or heart rhythm information that could potentially be obtained via home monitoring, at the time of his visit. I reminded MILEK CRISSMAN to expect a phone call prior to his visit.  Ricci Barker, RN 05/11/2019 3:59 PM   INSTRUCTIONS  FOR DOWNLOADING THE MYCHART APP TO SMARTPHONE  - The patient must first make sure to have activated MyChart and know their login information - If Apple, go to [employer] and type in MyChart in the search bar and download the app. If Android, ask patient to go to [employer] and type in [place] in the search bar and download the app. The app is free but as with any other app downloads, their phone may require them to verify saved payment information or Apple/Android password.  - The patient will need to then log into the app with their MyChart username  and password, and select [facility] as their healthcare provider to link the account. When it is time for your visit, go to the MyChart app, find appointments, and click Begin Video Visit. Be sure to Select Allow for your device to access the Microphone and Camera for your visit. You will then be connected, and your provider will be with you shortly.  **If they have any issues connecting, or need assistance please contact MyChart service desk [phone])**  **If using a computer, in order to ensure the best quality for their visit they will need to use either of the following Internet Browsers: [employer], or Google Chrome**  IF USING DOXIMITY or DOXY.ME - The patient will receive a link just prior to their visit by text.     FULL LENGTH CONSENT FOR TELE-HEALTH VISIT   I hereby voluntarily request, consent and authorize [facility] and its employed or contracted physicians, physician assistants, nurse practitioners or other licensed health care professionals (the Practitioner), to provide me with telemedicine health care services (the "Services") as deemed necessary by the treating Practitioner. I acknowledge and consent to receive the Services by the Practitioner via telemedicine. I understand that the telemedicine visit will involve communicating with the Practitioner through live audiovisual communication technology and the disclosure of certain medical information by electronic transmission. I acknowledge that I have been given the opportunity to request an in-person assessment or other available alternative prior to the telemedicine visit and am voluntarily participating in the telemedicine visit.  I understand that I have the right to withhold or withdraw my consent to the use of telemedicine in the course of my care at any time, without affecting my right to future care or treatment, and that the Practitioner or I may terminate the telemedicine visit at any time. I  understand that I have the right to inspect all information obtained and/or recorded in the course of the telemedicine visit and may receive copies of available information for a reasonable fee.  I understand that some of the potential risks of receiving the Services via telemedicine include:  Marland Kitchen Delay or interruption in medical evaluation due to technological equipment failure or disruption; . Information transmitted may not be sufficient (e.g. poor resolution of images) to allow for appropriate medical decision making by the Practitioner; and/or  . In rare instances, security protocols could fail, causing a breach of personal health information.  Furthermore, I acknowledge that it is my responsibility to provide information about my medical history, conditions and care that is complete and accurate to the best of my ability. I acknowledge that Practitioner's advice, recommendations, and/or decision may be based on factors not within their control, such as incomplete or inaccurate data provided by me or distortions of diagnostic images or specimens that may result from electronic transmissions. I understand that the practice of medicine is not an exact science and  that Practitioner makes no warranties or guarantees regarding treatment outcomes. I acknowledge that I will receive a copy of this consent concurrently upon execution via email to the email address I last provided but may also request a printed copy by calling the office of [facility].    I understand that my insurance will be billed for this visit.   I have read or had this consent read to me. . I understand the contents of this consent, which adequately explains the benefits and risks of the Services being provided via telemedicine.  . I have been provided ample opportunity to ask questions regarding this consent and the Services and have had my questions answered to my satisfaction. . I give my informed consent for the services to be  provided through the use of telemedicine in my medical care  By participating in this telemedicine visit I agree to the above.

## 2019-05-12 ENCOUNTER — Telehealth (INDEPENDENT_AMBULATORY_CARE_PROVIDER_SITE_OTHER): Payer: BC Managed Care – PPO | Admitting: Cardiovascular Disease

## 2019-05-12 DIAGNOSIS — N182 Chronic kidney disease, stage 2 (mild): Secondary | ICD-10-CM

## 2019-05-12 DIAGNOSIS — I2510 Atherosclerotic heart disease of native coronary artery without angina pectoris: Secondary | ICD-10-CM

## 2019-05-12 NOTE — Progress Notes (Signed)
Virtual Visit via Video Note   This visit type was conducted due to national recommendations for restrictions regarding the COVID-19 Pandemic (e.g. social distancing) in an effort to limit this patient's exposure and mitigate transmission in our community.  Due to his co-morbid illnesses, this patient is at least at moderate risk for complications without adequate follow up.  This format is felt to be most appropriate for this patient at this time.  All issues noted in this document were discussed and addressed.  A limited physical exam was performed with this format.  Please refer to the patient's chart for his consent to telehealth for [facility].   Date:  05/12/2019   ID:  Eilene Ghazi, DOB October 23, 1950, MRN [id number]  Patient Location: Home Provider Location: Home  PCP:  Lujean Amel, MD  Cardiologist:  Sanda Klein, MD  Electrophysiologist:  None   Evaluation Performed:  Follow-Up Visit  Chief Complaint:  CAD  History of Present Illness:    Travis Jenkins is a 68 y.o. male with CAD now roughly 2 years after placement of a drug-eluting stent to the mid LAD artery (Resolute Onyx 3.5 x 12 mm).  His presentation was unusual, syncope after bee stings, with mild cardiac enzyme elevation.  He feels well.  He has a rowing machine that he uses roughly 1 hour every day.  He does not have any chest discomfort during exercise.  Occasionally will have atypical left-sided chest discomfort and lying in bed.  The patient specifically denies any chest pain at rest exertion, dyspnea at rest or with exertion, orthopnea, paroxysmal nocturnal dyspnea, syncope, palpitations, focal neurological deficits, intermittent claudication, lower extremity edema, unexplained weight gain, cough, hemoptysis or wheezing.  The patient does not have symptoms concerning for COVID-19 infection (fever, chills, cough, or new shortness of breath).    Past Medical History:  Diagnosis Date  . Bradycardia  05/01/2017  . CAD (coronary artery disease)   . Coronary artery disease   . IBS (irritable bowel syndrome)   . Raynaud's disease   . Syncope    Past Surgical History:  Procedure Laterality Date  . ANKLE FRACTURE SURGERY    . ANTERIOR CRUCIATE LIGAMENT REPAIR  1990  . CARDIAC CATHETERIZATION    . CHOLECYSTECTOMY N/A 02/17/2018   Procedure: LAPAROSCOPIC CHOLECYSTECTOMY;  Surgeon: Rolm Bookbinder, MD;  Location: [facility];  Service: General;  Laterality: N/A;  . CORONARY STENT INTERVENTION N/A 05/01/2017   Procedure: CORONARY STENT INTERVENTION;  Surgeon: Nelva Bush, MD;  Location: [facility];  Service: Cardiovascular;  Laterality: N/A;  . INTRAVASCULAR PRESSURE WIRE/FFR STUDY N/A 05/01/2017   Procedure: INTRAVASCULAR PRESSURE WIRE/FFR STUDY;  Surgeon: Nelva Bush, MD;  Location: [facility];  Service: Cardiovascular;  Laterality: N/A;  . LEFT HEART CATH AND CORONARY ANGIOGRAPHY N/A 05/01/2017   Procedure: LEFT HEART CATH AND CORONARY ANGIOGRAPHY;  Surgeon: Nelva Bush, MD;  Location: [facility];  Service: Cardiovascular;  Laterality: N/A;  . MASS EXCISION Right 02/07/2019   Procedure: RIGHT LONG FINGER EXCISION MASS;  Surgeon: Leanora Cover, MD;  Location: [facility];  Service: Orthopedics;  Laterality: Right;     Current Meds  Medication Sig  . acetaminophen (TYLENOL) 325 MG tablet Take 325-650 mg by mouth every 6 (six) hours as needed (for pain or headaches).  Marland Kitchen aspirin EC 81 MG tablet Take 81 mg by mouth daily.  . nitroGLYCERIN (NITROSTAT) 0.4 MG SL tablet Place 1 tablet (0.4 mg total) under the tongue  every 5 (five) minutes as needed for chest pain.  . rosuvastatin (CRESTOR) 20 MG tablet Take 1 tablet (20 mg total) by mouth daily.     Allergies:   Wasp venom, Erythromycin, and Tetracyclines & related   Social History   Tobacco Use  . Smoking status: Former Smoker    Packs/day: 0.50    Years: 12.00    Pack years: 6.00    Types:  Cigarettes    Start date: 06/20/1966    Quit date: 08/19/1971    Years since quitting: 47.7  . Smokeless tobacco: Never Used  Substance Use Topics  . Alcohol use: Yes    Alcohol/week: 4.0 standard drinks    Types: 4 Cans of beer per week    Comment: social  . Drug use: No     Family Hx: The patient's family history includes Diabetes in his father and mother; Heart disease in his father and mother; Hyperlipidemia in his father; Parkinson's disease in his mother.  ROS:   Please see the history of present illness.    All other systems reviewed and are negative.  Prior CV studies:   The following studies were reviewed today:  Labs/Other Tests and Data Reviewed:    EKG:  No ECG reviewed.  Recent Labs: No results found for requested labs within last 8760 hours.   Recent Lipid Panel Lab Results  Component Value Date/Time   CHOL 107 02/12/2018 07:56 AM   TRIG 135 02/12/2018 07:56 AM   HDL 40 (L) 02/12/2018 07:56 AM   CHOLHDL 2.7 02/12/2018 07:56 AM   LDLCALC 40 02/12/2018 07:56 AM  Dr. Dorthy Cooler 2019 total cholesterol 91, HDL 40, LDL 35, triglycerides 76, creatinine 1.28, normal liver function tests, hemoglobin A1c 5.9%  Wt Readings from Last 3 Encounters:  05/12/19 205 lb (93 kg)  02/07/19 200 lb 9.9 oz (91 kg)  05/13/18 218 lb 12.8 oz (99.2 kg)     Objective:    Vital Signs:  BP 130/74   Pulse (!) 52   Ht 6' (1.829 m)   Wt 205 lb (93 kg)   BMI 27.80 kg/m    VITAL SIGNS:  reviewed GEN:  no acute distress EYES:  sclerae anicteric, EOMI - Extraocular Movements Intact RESPIRATORY:  normal respiratory effort, symmetric expansion CARDIOVASCULAR:  no peripheral edema SKIN:  no rash, lesions or ulcers. MUSCULOSKELETAL:  no obvious deformities. NEURO:  alert and oriented x 3, no obvious focal deficit PSYCH:  normal affect  ASSESSMENT & PLAN:    1. CAD: Asymptomatic, exercising regularly.  On aspirin and statin.  Unable to receive beta-blockers due to chronic resting  bradycardia. 2. HLP: On statin a very low LDL cholesterol, borderline low HDL.  Encouraged to continue physical activity.  COVID-19 Education: The signs and symptoms of COVID-19 were discussed with the patient and how to seek care for testing (follow up with PCP or arrange E-visit).  The importance of social distancing was discussed today.  Time:   Today, I have spent 12 minutes with the patient with telehealth technology discussing the above problems.     Medication Adjustments/Labs and Tests Ordered: Current medicines are reviewed at length with the patient today.  Concerns regarding medicines are outlined above.   Tests Ordered: No orders of the defined types were placed in this encounter.   Medication Changes: No orders of the defined types were placed in this encounter.   Follow Up:  In Person 1 year  Signed, Sanda Klein, MD  05/12/2019 8:46 AM  [facility]

## 2019-05-12 NOTE — Patient Instructions (Signed)

## 2019-08-20 ENCOUNTER — Other Ambulatory Visit: Payer: Self-pay | Admitting: Cardiovascular Disease

## 2020-04-19 ENCOUNTER — Ambulatory Visit: Payer: BC Managed Care – PPO

## 2020-05-11 ENCOUNTER — Ambulatory Visit (INDEPENDENT_AMBULATORY_CARE_PROVIDER_SITE_OTHER): Payer: PPO | Admitting: Cardiovascular Disease

## 2020-05-11 ENCOUNTER — Encounter: Payer: Self-pay | Admitting: Cardiovascular Disease

## 2020-05-11 ENCOUNTER — Other Ambulatory Visit: Payer: Self-pay

## 2020-05-11 VITALS — BP 116/68 | HR 62 | Ht 73.0 in | Wt 227.4 lb

## 2020-05-11 DIAGNOSIS — E785 Hyperlipidemia, unspecified: Secondary | ICD-10-CM | POA: Diagnosis not present

## 2020-05-11 DIAGNOSIS — I2510 Atherosclerotic heart disease of native coronary artery without angina pectoris: Secondary | ICD-10-CM

## 2020-05-11 NOTE — Progress Notes (Signed)
Cardiology Office Note   Date:  05/11/2020   ID:  Travis Jenkins, DOB June 13, 1951, MRN [id number]   PCP:  Lujean Amel, MD  Cardiologist:  Sanda Klein, MD  Electrophysiologist:  None   Evaluation Performed:  Follow-Up Visit  Chief Complaint:  CAD  History of Present Illness:    Travis Jenkins is a 68 y.o. male with CAD now roughly 3 years after placement of a drug-eluting stent to the mid LAD artery (Resolute Onyx 3.5 x 12 mm).  His presentation was unusual, syncope after bee stings, with mild cardiac enzyme elevation.  He retired a few months ago and feels much more relaxed.  He has numerous hobbies and activities and 7 grandchildren and is staying busy.  Unfortunately he has gained about 20 pounds of weight since last year.  He continues to enjoy using his rowing machine.  He does have occasional atypical discomfort towards the left side of the sternum that is present at rest and does not increase with physical activity.  He also complains of occasional pain on the external surface of his right foot, that also occurs at rest and resolves when he moves the foot around.  It does not occur during walking.  The patient specifically denies any chest pain at rest exertion, dyspnea at rest or with exertion, orthopnea, paroxysmal nocturnal dyspnea, syncope, palpitations, focal neurological deficits, intermittent claudication, lower extremity edema, unexplained weight gain, cough, hemoptysis or wheezing.   Past Medical History:  Diagnosis Date  . Bradycardia 05/01/2017  . CAD (coronary artery disease)   . Coronary artery disease   . IBS (irritable bowel syndrome)   . Raynaud's disease   . Syncope    Past Surgical History:  Procedure Laterality Date  . ANKLE FRACTURE SURGERY    . ANTERIOR CRUCIATE LIGAMENT REPAIR  1990  . CARDIAC CATHETERIZATION    . CHOLECYSTECTOMY N/A 02/17/2018   Procedure: LAPAROSCOPIC CHOLECYSTECTOMY;  Surgeon: Rolm Bookbinder, MD;  Location: [facility];   Service: General;  Laterality: N/A;  . CORONARY STENT INTERVENTION N/A 05/01/2017   Procedure: CORONARY STENT INTERVENTION;  Surgeon: Nelva Bush, MD;  Location: [facility];  Service: Cardiovascular;  Laterality: N/A;  . INTRAVASCULAR PRESSURE WIRE/FFR STUDY N/A 05/01/2017   Procedure: INTRAVASCULAR PRESSURE WIRE/FFR STUDY;  Surgeon: Nelva Bush, MD;  Location: [facility];  Service: Cardiovascular;  Laterality: N/A;  . LEFT HEART CATH AND CORONARY ANGIOGRAPHY N/A 05/01/2017   Procedure: LEFT HEART CATH AND CORONARY ANGIOGRAPHY;  Surgeon: Nelva Bush, MD;  Location: [place] [facility];  Service: Cardiovascular;  Laterality: N/A;  . MASS EXCISION Right 02/07/2019   Procedure: RIGHT LONG FINGER EXCISION MASS;  Surgeon: Leanora Cover, MD;  Location: [facility];  Service: Orthopedics;  Laterality: Right;     Current Meds  Medication Sig  . aspirin EC 81 MG tablet Take 81 mg by mouth daily.  . nitroGLYCERIN (NITROSTAT) 0.4 MG SL tablet Place 1 tablet (0.4 mg total) under the tongue every 5 (five) minutes as needed for chest pain.  . rosuvastatin (CRESTOR) 20 MG tablet TAKE 1 TABLET BY MOUTH EVERY DAY  . [DISCONTINUED] acetaminophen (TYLENOL) 325 MG tablet Take 325-650 mg by mouth every 6 (six) hours as needed (for pain or headaches).  . [DISCONTINUED] HYDROcodone-acetaminophen (NORCO) 5-325 MG tablet 1-2 tabs po q6 hours prn pain     Allergies:   Wasp venom, Erythromycin, and Tetracyclines & related   Social History   Tobacco Use  . Smoking  status: Former Smoker    Packs/day: 0.50    Years: 12.00    Pack years: 6.00    Types: Cigarettes    Start date: 06/20/1966    Quit date: 08/19/1971    Years since quitting: 48.7  . Smokeless tobacco: Never Used  Substance Use Topics  . Alcohol use: Yes    Alcohol/week: 4.0 standard drinks    Types: 4 Cans of beer per week    Comment: social  . Drug use: No     Family Hx: The patient's family history  includes Diabetes in his father and mother; Heart disease in his father and mother; Hyperlipidemia in his father; Parkinson's disease in his mother.  ROS:   Please see the history of present illness.    All other systems are reviewed and are negative.   Prior CV studies:   The following studies were reviewed today:  Labs/Other Tests and Data Reviewed:    EKG: Ordered today, shows normal sinus rhythm, normal tracing  Recent Labs: No results found for requested labs within last 8760 hours.  05/30/2019 Creatinine 1.08, potassium 4.1, normal LFTs, hemoglobin A1c 5.6% Recent Lipid Panel Lab Results  Component Value Date/Time   CHOL 107 02/12/2018 07:56 AM   TRIG 135 02/12/2018 07:56 AM   HDL 40 (L) 02/12/2018 07:56 AM   CHOLHDL 2.7 02/12/2018 07:56 AM   LDLCALC 40 02/12/2018 07:56 AM  05/30/2019 total cholesterol 125, HDL 41, LDL 53, triglycerides 151 Wt Readings from Last 3 Encounters:  05/11/20 227 lb 6.4 oz (103.1 kg)  05/12/19 205 lb (93 kg)  02/07/19 200 lb 9.9 oz (91 kg)     Objective:    Vital Signs:  BP 116/68   Pulse 62   Ht 6\' 1"  (1.854 m)   Wt 227 lb 6.4 oz (103.1 kg)   SpO2 100%   BMI 30.00 kg/m     General: Alert, oriented x3, no distress, overweight/borderline obese Head: no evidence of trauma, PERRL, EOMI, no exophtalmos or lid lag, no myxedema, no xanthelasma; normal ears, nose and oropharynx Neck: normal jugular venous pulsations and no hepatojugular reflux; brisk carotid pulses without delay and no carotid bruits Chest: clear to auscultation, no signs of consolidation by percussion or palpation, normal fremitus, symmetrical and full respiratory excursions Cardiovascular: normal position and quality of the apical impulse, regular rhythm, normal first and second heart sounds, no murmurs, rubs or gallops Abdomen: no tenderness or distention, no masses by palpation, no abnormal pulsatility or arterial bruits, normal bowel sounds, no  hepatosplenomegaly Extremities: no clubbing, cyanosis or edema; 2+ radial, ulnar and brachial pulses bilaterally; 2+ right femoral, posterior tibial and dorsalis pedis pulses; 2+ left femoral, posterior tibial and dorsalis pedis pulses; no subclavian or femoral bruits Neurological: grossly nonfocal Psych: Normal mood and affect   ASSESSMENT & PLAN:    1. Coronary artery disease involving native coronary artery of native heart without angina pectoris   2. Dyslipidemia (high LDL; low HDL)      1. CAD: Asymptomatic.  On aspirin and statin but intolerant to beta-blockers due to relative bradycardia. 2. HLP: Had an excellent LDL cholesterol last year, but also borderline HDL.  I am worried that with his substantial weight gain there will be some deterioration in his HDL level.  He has labs scheduled for next month with Dr. Dorthy Cooler.  Encourage renewed attention to exercise limiting calories and carbohydrates in his diet to reverse the weight gain.   COVID-19 Education: The signs and symptoms of  COVID-19 were discussed with the patient and how to seek care for testing (follow up with PCP or arrange E-visit).  The importance of social distancing was discussed today.  Time:   Today, I have spent 12 minutes with the patient with telehealth technology discussing the above problems.     Medication Adjustments/Labs and Tests Ordered: Current medicines are reviewed at length with the patient today.  Concerns regarding medicines are outlined above.   Tests Ordered: Orders Placed This Encounter  Procedures  . EKG 12-Lead    Medication Changes: No orders of the defined types were placed in this encounter.   Follow Up:  In Person 1 year  Signed, Sanda Klein, MD  05/11/2020 12:32 PM    [place]

## 2020-05-11 NOTE — Patient Instructions (Signed)
Medication Instructions:  None *If you need a refill on your cardiac medications before your next appointment, please call your pharmacy*   Lab Work: None If you have labs (blood work) drawn today and your tests are completely normal, you will receive your results only by: Marland Kitchen MyChart Message (if you have MyChart) OR . A paper copy in the mail If you have any lab test that is abnormal or we need to change your treatment, we will call you to review the results.   Testing/Procedures: None   Follow-Up: At [facility], you and your health needs are our priority.  As part of our continuing mission to provide you with exceptional heart care, we have created designated Provider Care Teams.  These Care Teams include your primary Cardiologist (physician) and Advanced Practice Providers (APPs -  Physician Assistants and Nurse Practitioners) who all work together to provide you with the care you need, when you need it.  We recommend signing up for the patient portal called "MyChart".  Sign up information is provided on this After Visit Summary.  MyChart is used to connect with patients for Virtual Visits (Telemedicine).  Patients are able to view lab/test results, encounter notes, upcoming appointments, etc.  Non-urgent messages can be sent to your provider as well.   To learn more about what you can do with MyChart, go to [email or web address].    Your next appointment:   12 month(s)  The format for your next appointment:   In Person  Provider:   Sanda Klein, MD   Other Instructions None

## 2020-05-25 ENCOUNTER — Other Ambulatory Visit: Payer: Self-pay | Admitting: Cardiovascular Disease

## 2020-05-25 NOTE — Telephone Encounter (Signed)
Rx has been sent to the pharmacy electronically. ° °

## 2020-05-31 ENCOUNTER — Ambulatory Visit: Payer: BC Managed Care – PPO

## 2020-06-11 ENCOUNTER — Other Ambulatory Visit: Payer: Self-pay | Admitting: Family Medicine

## 2020-06-11 DIAGNOSIS — E7800 Pure hypercholesterolemia, unspecified: Secondary | ICD-10-CM | POA: Diagnosis not present

## 2020-06-11 DIAGNOSIS — Z136 Encounter for screening for cardiovascular disorders: Secondary | ICD-10-CM

## 2020-06-11 DIAGNOSIS — Z125 Encounter for screening for malignant neoplasm of prostate: Secondary | ICD-10-CM | POA: Diagnosis not present

## 2020-06-11 DIAGNOSIS — I2510 Atherosclerotic heart disease of native coronary artery without angina pectoris: Secondary | ICD-10-CM | POA: Diagnosis not present

## 2020-06-11 DIAGNOSIS — Z0000 Encounter for general adult medical examination without abnormal findings: Secondary | ICD-10-CM | POA: Diagnosis not present

## 2020-06-11 DIAGNOSIS — F5101 Primary insomnia: Secondary | ICD-10-CM | POA: Diagnosis not present

## 2020-06-11 DIAGNOSIS — Z79899 Other long term (current) drug therapy: Secondary | ICD-10-CM | POA: Diagnosis not present

## 2020-06-11 DIAGNOSIS — Z87891 Personal history of nicotine dependence: Secondary | ICD-10-CM

## 2020-06-13 ENCOUNTER — Ambulatory Visit
Admission: RE | Admit: 2020-06-13 | Discharge: 2020-06-13 | Disposition: A | Discharge status: Home / Self Care | Payer: PPO | Source: Ambulatory Visit | Attending: Family Medicine | Admitting: Family Medicine

## 2020-06-13 DIAGNOSIS — Z136 Encounter for screening for cardiovascular disorders: Secondary | ICD-10-CM

## 2020-06-13 DIAGNOSIS — Z87891 Personal history of nicotine dependence: Secondary | ICD-10-CM | POA: Diagnosis not present

## 2020-08-13 ENCOUNTER — Other Ambulatory Visit: Payer: Self-pay | Admitting: Cardiovascular Disease

## 2020-10-23 DIAGNOSIS — C44212 Basal cell carcinoma of skin of right ear and external auricular canal: Secondary | ICD-10-CM | POA: Diagnosis not present

## 2020-10-23 DIAGNOSIS — L603 Nail dystrophy: Secondary | ICD-10-CM | POA: Diagnosis not present

## 2020-11-27 DIAGNOSIS — Z85828 Personal history of other malignant neoplasm of skin: Secondary | ICD-10-CM | POA: Diagnosis not present

## 2020-11-27 DIAGNOSIS — Z08 Encounter for follow-up examination after completed treatment for malignant neoplasm: Secondary | ICD-10-CM | POA: Diagnosis not present

## 2021-04-24 DIAGNOSIS — M25562 Pain in left knee: Secondary | ICD-10-CM | POA: Diagnosis not present

## 2021-04-29 DIAGNOSIS — U071 COVID-19: Secondary | ICD-10-CM | POA: Diagnosis not present

## 2021-05-15 ENCOUNTER — Ambulatory Visit: Payer: PPO | Admitting: Cardiovascular Disease

## 2021-06-27 ENCOUNTER — Ambulatory Visit: Payer: PPO | Admitting: Cardiovascular Disease

## 2021-06-27 ENCOUNTER — Other Ambulatory Visit: Payer: Self-pay

## 2021-06-27 ENCOUNTER — Encounter: Payer: Self-pay | Admitting: Cardiovascular Disease

## 2021-06-27 VITALS — BP 130/88 | HR 59 | Ht 73.0 in | Wt 224.8 lb

## 2021-06-27 DIAGNOSIS — I2510 Atherosclerotic heart disease of native coronary artery without angina pectoris: Secondary | ICD-10-CM

## 2021-06-27 DIAGNOSIS — E785 Hyperlipidemia, unspecified: Secondary | ICD-10-CM | POA: Diagnosis not present

## 2021-06-27 NOTE — Patient Instructions (Signed)

## 2021-06-27 NOTE — Progress Notes (Signed)
Cardiology Office Note   Date:  06/27/2021   ID:  Travis Jenkins, DOB [birth year], MRN [id number]   PCP:  Lujean Amel, MD  Cardiologist:  Sanda Klein, MD  Electrophysiologist:  None   Evaluation Performed:  Follow-Up Visit  Chief Complaint:  CAD  History of Present Illness:    Travis Jenkins is a 70 y.o. male with CAD now roughly 4 years after placement of a drug-eluting stent to the mid LAD artery (Resolute Onyx 3.5 x 12 mm).  His presentation was unusual, syncope after bee stings, with mild cardiac enzyme elevation.  He is enjoying retirement, working a lot in the yard.  However, 6 months ago he developed a problem with his left knee and has been walking with less (used to walk 4 miles a day 5 or 6 days a week).  He is not using his rowing machine.  He has gained another 7 pounds.  His BMI now puts him very close to the obese range (29.6).  He has not had any problems with angina either at rest or with activity and denies dyspnea with exertion, orthopnea, PND, lower extremity edema, intermittent claudication or any focal neurological complaints.  He is scheduled for a physical exam with his primary care provider next week and will have a lipid profile checked then.  A year ago his LDL was 44 and his HDL was 43.  He does not have diabetes mellitus.  He does not smoke.   Past Medical History:  Diagnosis Date   Bradycardia 05/01/2017   CAD (coronary artery disease)    Coronary artery disease    IBS (irritable bowel syndrome)    Raynaud's disease    Syncope    Past Surgical History:  Procedure Laterality Date   ANKLE FRACTURE SURGERY     ANTERIOR CRUCIATE LIGAMENT REPAIR  1990   CARDIAC CATHETERIZATION     CHOLECYSTECTOMY N/A 02/17/2018   Procedure: LAPAROSCOPIC CHOLECYSTECTOMY;  Surgeon: Rolm Bookbinder, MD;  Location: [facility];  Service: General;  Laterality: N/A;   CORONARY STENT INTERVENTION N/A 05/01/2017   Procedure: CORONARY STENT INTERVENTION;  Surgeon: Nelva Bush, MD;  Location: [place] [facility];  Service: Cardiovascular;  Laterality: N/A;   INTRAVASCULAR PRESSURE WIRE/FFR STUDY N/A 05/01/2017   Procedure: INTRAVASCULAR PRESSURE WIRE/FFR STUDY;  Surgeon: Nelva Bush, MD;  Location: [place] [facility];  Service: Cardiovascular;  Laterality: N/A;   LEFT HEART CATH AND CORONARY ANGIOGRAPHY N/A 05/01/2017   Procedure: LEFT HEART CATH AND CORONARY ANGIOGRAPHY;  Surgeon: Nelva Bush, MD;  Location: [place] [facility];  Service: Cardiovascular;  Laterality: N/A;   MASS EXCISION Right 02/07/2019   Procedure: RIGHT LONG FINGER EXCISION MASS;  Surgeon: Leanora Cover, MD;  Location: [facility];  Service: Orthopedics;  Laterality: Right;     No outpatient medications have been marked as taking for the 06/27/21 encounter (Office Visit) with Deanndra Kirley, Dani Gobble, MD.     Allergies:   Wasp venom, Erythromycin, and Tetracyclines & related   Social History   Tobacco Use   Smoking status: Former    Packs/day: 0.50    Years: 12.00    Pack years: 6.00    Types: Cigarettes    Start date: 06/20/1966    Quit date: 08/19/1971    Years since quitting: 49.8   Smokeless tobacco: Never  Substance Use Topics   Alcohol use: Yes    Alcohol/week: 4.0 standard drinks    Types: 4 Cans of beer per week  Comment: social   Drug use: No     Family Hx: The patient's family history includes Diabetes in his father and mother; Heart disease in his father and mother; Hyperlipidemia in his father; Parkinson's disease in his mother.  ROS:   Please see the history of present illness.    All other systems are reviewed and are negative.   Prior CV studies:   The following studies were reviewed today:  Labs/Other Tests and Data Reviewed:    EKG: Ordered today, shows normal sinus rhythm, normal tracing  Recent Labs: No results found for requested labs within last 8760 hours.  05/30/2019 Creatinine 1.08, potassium 4.1, normal LFTs, hemoglobin A1c  5.6% Recent Lipid Panel Lab Results  Component Value Date/Time   CHOL 107 02/12/2018 07:56 AM   TRIG 135 02/12/2018 07:56 AM   HDL 40 (L) 02/12/2018 07:56 AM   CHOLHDL 2.7 02/12/2018 07:56 AM   LDLCALC 40 02/12/2018 07:56 AM  05/30/2019 total cholesterol 125, HDL 41, LDL 53, triglycerides 151 Wt Readings from Last 3 Encounters:  06/27/21 224 lb 12.8 oz (102 kg)  05/11/20 227 lb 6.4 oz (103.1 kg)  05/12/19 205 lb (93 kg)     Objective:    Vital Signs:  BP 130/88   Pulse (!) 59   Ht 6\' 1"  (1.854 m)   Wt 224 lb 12.8 oz (102 kg)   SpO2 98%   BMI 29.66 kg/m      General: Alert, oriented x3, no distress, borderline obese, but appears fit Head: no evidence of trauma, PERRL, EOMI, no exophtalmos or lid lag, no myxedema, no xanthelasma; normal ears, nose and oropharynx Neck: normal jugular venous pulsations and no hepatojugular reflux; brisk carotid pulses without delay and no carotid bruits Chest: clear to auscultation, no signs of consolidation by percussion or palpation, normal fremitus, symmetrical and full respiratory excursions Cardiovascular: normal position and quality of the apical impulse, regular rhythm, normal first and second heart sounds, no murmurs, rubs or gallops Abdomen: no tenderness or distention, no masses by palpation, no abnormal pulsatility or arterial bruits, normal bowel sounds, no hepatosplenomegaly Extremities: no clubbing, cyanosis or edema; 2+ radial, ulnar and brachial pulses bilaterally; 2+ right femoral, posterior tibial and dorsalis pedis pulses; 2+ left femoral, posterior tibial and dorsalis pedis pulses; no subclavian or femoral bruits Neurological: grossly nonfocal Psych: Normal mood and affect   ASSESSMENT & PLAN:    1. Coronary artery disease involving native coronary artery of native heart without angina pectoris   2. Dyslipidemia (high LDL; low HDL)       CAD: Asymptomatic.  On statin and aspirin, intolerant to beta-blockers due to  bradycardia. HLP: Strongly encouraged to try to lose weight and resume exercise.  LDL is excellent, but his HDL is rather low and will deteriorate further if he keeps gaining weight.   Patient Instructions  Medication Instructions:  No changes *If you need a refill on your cardiac medications before your next appointment, please call your pharmacy*   Lab Work: None ordered If you have labs (blood work) drawn today and your tests are completely normal, you will receive your results only by: [place] (if you have MyChart) OR A paper copy in the mail If you have any lab test that is abnormal or we need to change your treatment, we will call you to review the results.   Testing/Procedures: None ordered   Follow-Up: At [facility], you and your health needs are our priority.  As part of our  continuing mission to provide you with exceptional heart care, we have created designated Provider Care Teams.  These Care Teams include your primary Cardiologist (physician) and Advanced Practice Providers (APPs -  Physician Assistants and Nurse Practitioners) who all work together to provide you with the care you need, when you need it.  We recommend signing up for the patient portal called "MyChart".  Sign up information is provided on this After Visit Summary.  MyChart is used to connect with patients for Virtual Visits (Telemedicine).  Patients are able to view lab/test results, encounter notes, upcoming appointments, etc.  Non-urgent messages can be sent to your provider as well.   To learn more about what you can do with MyChart, go to [email or web address].    Your next appointment:   12 month(s)  The format for your next appointment:   In Person  Provider:   Sanda Klein, MD      Signed, Sanda Klein, MD  06/27/2021 12:17 PM    [place]

## 2021-07-01 DIAGNOSIS — E7800 Pure hypercholesterolemia, unspecified: Secondary | ICD-10-CM | POA: Diagnosis not present

## 2021-07-01 DIAGNOSIS — Z0001 Encounter for general adult medical examination with abnormal findings: Secondary | ICD-10-CM | POA: Diagnosis not present

## 2021-07-01 DIAGNOSIS — I2510 Atherosclerotic heart disease of native coronary artery without angina pectoris: Secondary | ICD-10-CM | POA: Diagnosis not present

## 2021-07-01 DIAGNOSIS — Z79899 Other long term (current) drug therapy: Secondary | ICD-10-CM | POA: Diagnosis not present

## 2021-07-01 DIAGNOSIS — Z125 Encounter for screening for malignant neoplasm of prostate: Secondary | ICD-10-CM | POA: Diagnosis not present

## 2021-07-01 DIAGNOSIS — F5101 Primary insomnia: Secondary | ICD-10-CM | POA: Diagnosis not present

## 2021-07-23 IMAGING — US US ABDOMINAL AORTA SCREENING AAA
1 series · 14 of 16 positions shown · non-contrast
Comparison: None.

CLINICAL DATA: Male between 65-75 years of age with a smoking
history.

EXAM:
US ABDOMINAL AORTA MEDICARE SCREENING
TECHNIQUE: Ultrasound examination of the abdominal aorta was performed as a
screening evaluation for abdominal aortic aneurysm.

[Series 1: us abdominal aorta screening aaa · 0.23mm/px · 14 of 16 slices shown]
[im 1/16]
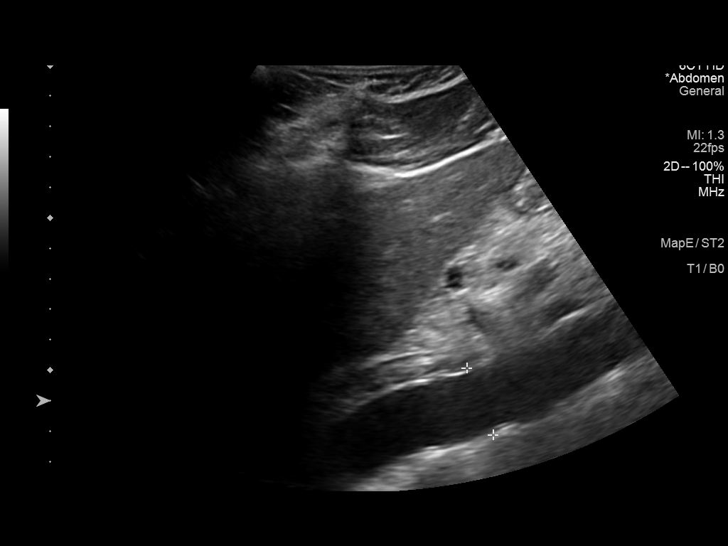
[im 2/16]
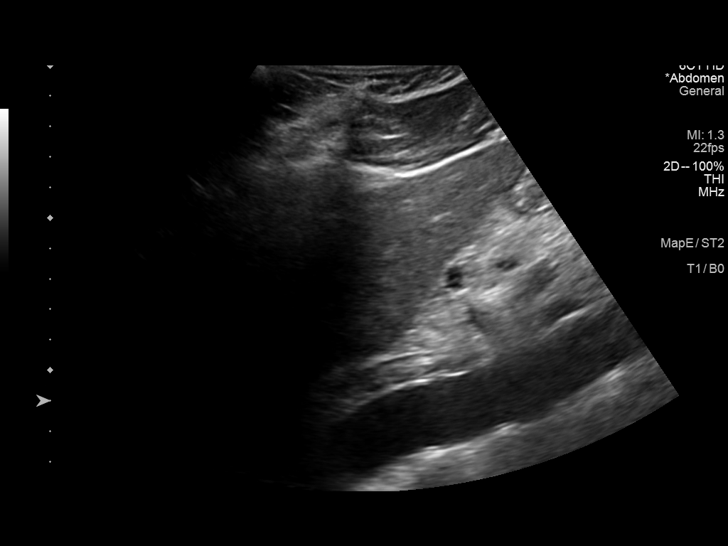
[im 3/16]
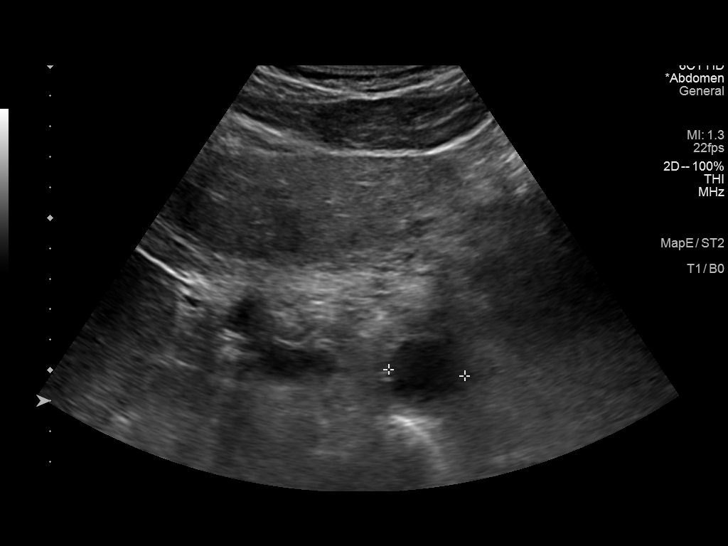
[im 5/16]
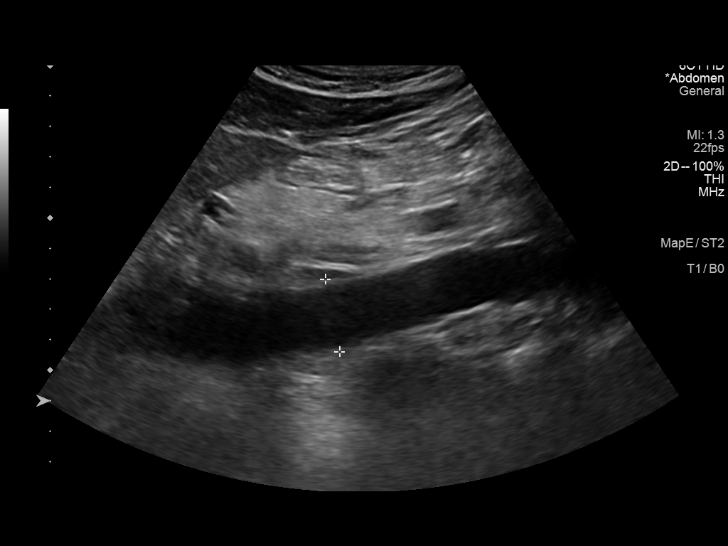
[im 6/16]
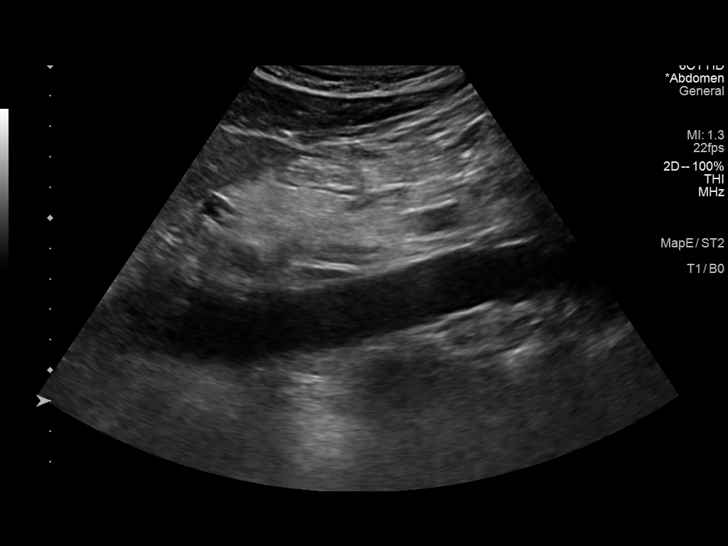
[im 7/16]
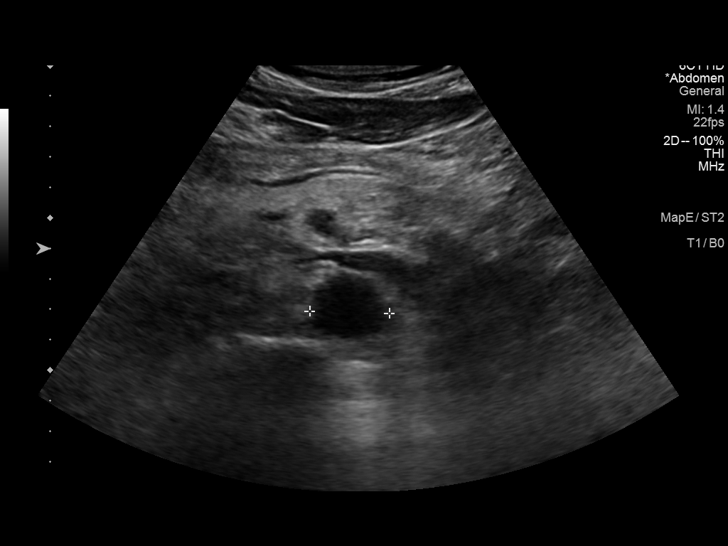
[im 8/16]
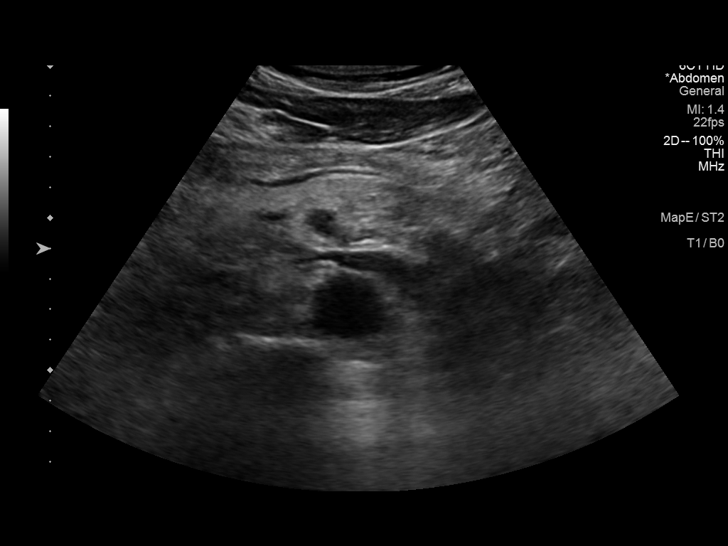
[im 9/16]
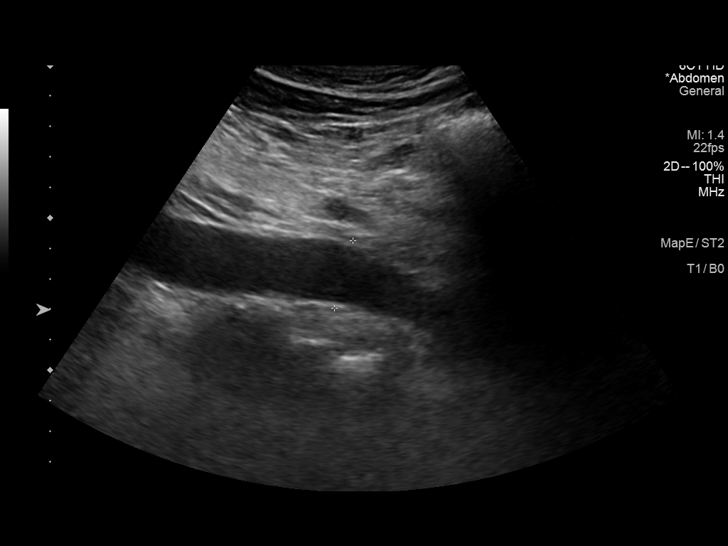
[im 10/16]
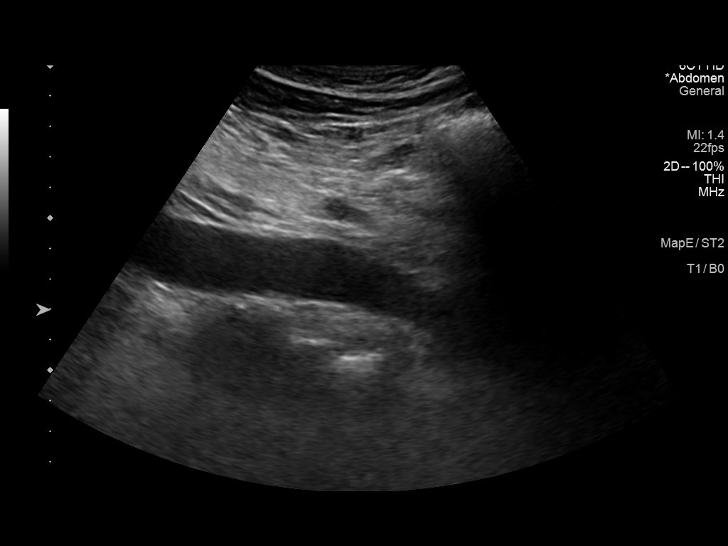
[im 11/16]
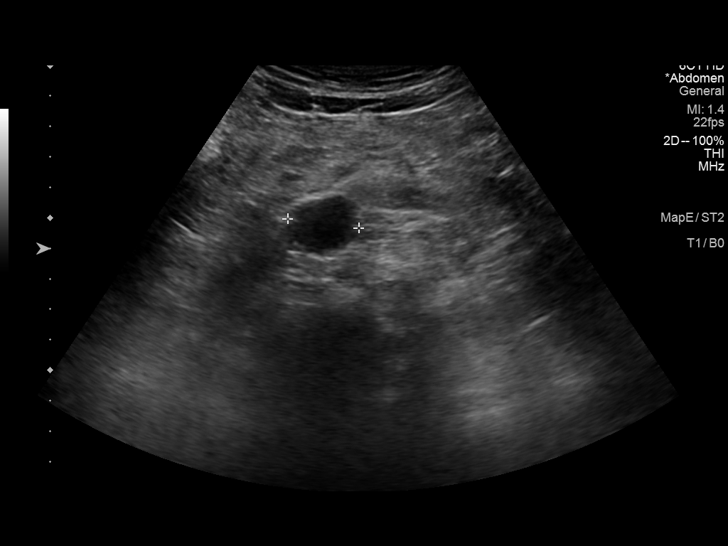
[im 13/16]
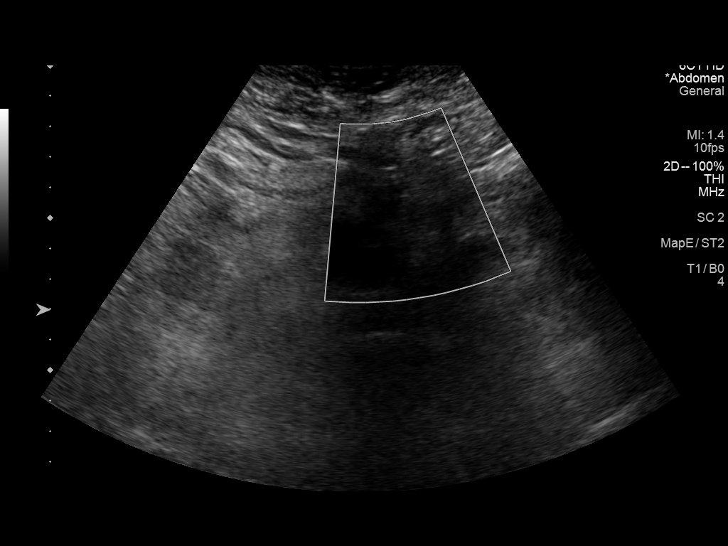
[im 14/16]
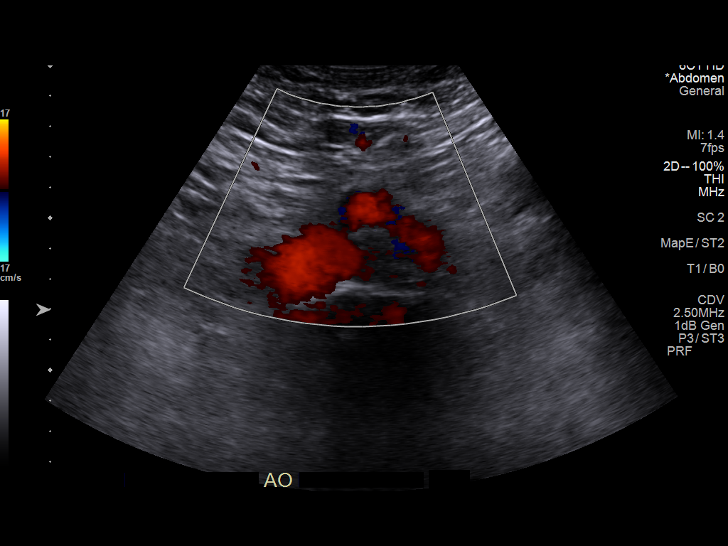
[im 15/16]
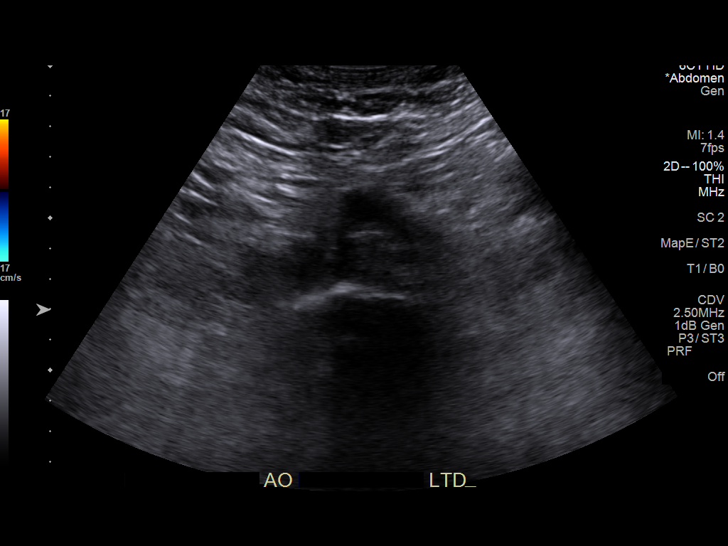
[im 16/16]
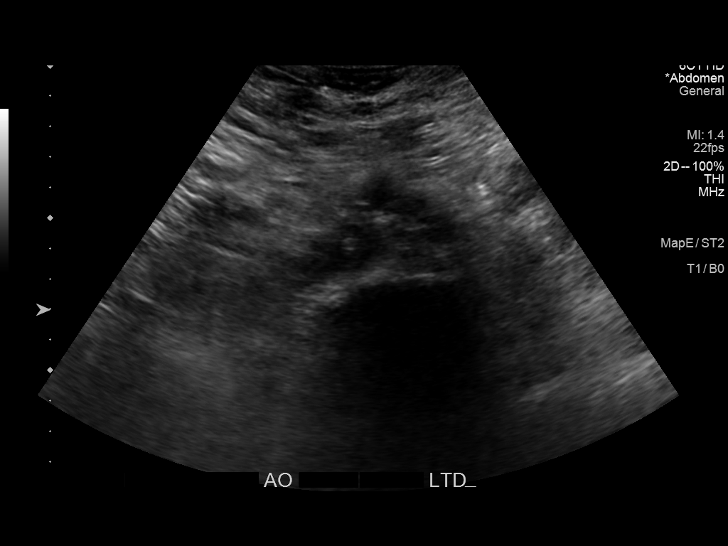

[14 of 16 positions shown; findings below may reference images not displayed]

FINDINGS: Abdominal aortic measurements as follows:

Proximal:  2.4 x 2.5 cm

Mid:  2.4 x 2.6 cm

Distal:  2.3 x 2.4 cm
IMPRESSION: No evidence for an abdominal aortic aneurysm.

## 2021-08-05 ENCOUNTER — Other Ambulatory Visit: Payer: Self-pay | Admitting: Cardiovascular Disease

## 2022-02-19 DIAGNOSIS — C44712 Basal cell carcinoma of skin of right lower limb, including hip: Secondary | ICD-10-CM | POA: Diagnosis not present

## 2022-02-19 DIAGNOSIS — Z85828 Personal history of other malignant neoplasm of skin: Secondary | ICD-10-CM | POA: Diagnosis not present

## 2022-02-19 DIAGNOSIS — Z08 Encounter for follow-up examination after completed treatment for malignant neoplasm: Secondary | ICD-10-CM | POA: Diagnosis not present

## 2022-02-19 DIAGNOSIS — B351 Tinea unguium: Secondary | ICD-10-CM | POA: Diagnosis not present

## 2022-03-19 DIAGNOSIS — L928 Other granulomatous disorders of the skin and subcutaneous tissue: Secondary | ICD-10-CM | POA: Diagnosis not present

## 2022-03-19 DIAGNOSIS — Z08 Encounter for follow-up examination after completed treatment for malignant neoplasm: Secondary | ICD-10-CM | POA: Diagnosis not present

## 2022-03-19 DIAGNOSIS — Z85828 Personal history of other malignant neoplasm of skin: Secondary | ICD-10-CM | POA: Diagnosis not present

## 2022-05-30 DIAGNOSIS — M25561 Pain in right knee: Secondary | ICD-10-CM | POA: Diagnosis not present

## 2022-06-20 DIAGNOSIS — M25572 Pain in left ankle and joints of left foot: Secondary | ICD-10-CM | POA: Diagnosis not present

## 2022-06-20 DIAGNOSIS — M25571 Pain in right ankle and joints of right foot: Secondary | ICD-10-CM | POA: Diagnosis not present

## 2022-07-02 DIAGNOSIS — Z79899 Other long term (current) drug therapy: Secondary | ICD-10-CM | POA: Diagnosis not present

## 2022-07-02 DIAGNOSIS — Z0001 Encounter for general adult medical examination with abnormal findings: Secondary | ICD-10-CM | POA: Diagnosis not present

## 2022-07-02 DIAGNOSIS — R5383 Other fatigue: Secondary | ICD-10-CM | POA: Diagnosis not present

## 2022-07-02 DIAGNOSIS — E7800 Pure hypercholesterolemia, unspecified: Secondary | ICD-10-CM | POA: Diagnosis not present

## 2022-07-02 DIAGNOSIS — Z125 Encounter for screening for malignant neoplasm of prostate: Secondary | ICD-10-CM | POA: Diagnosis not present

## 2022-07-02 DIAGNOSIS — R7301 Impaired fasting glucose: Secondary | ICD-10-CM | POA: Diagnosis not present

## 2022-07-07 DIAGNOSIS — R7303 Prediabetes: Secondary | ICD-10-CM | POA: Diagnosis not present

## 2022-07-07 DIAGNOSIS — I2510 Atherosclerotic heart disease of native coronary artery without angina pectoris: Secondary | ICD-10-CM | POA: Diagnosis not present

## 2022-07-07 DIAGNOSIS — E7800 Pure hypercholesterolemia, unspecified: Secondary | ICD-10-CM | POA: Diagnosis not present

## 2022-07-07 DIAGNOSIS — Z0001 Encounter for general adult medical examination with abnormal findings: Secondary | ICD-10-CM | POA: Diagnosis not present

## 2022-07-07 DIAGNOSIS — F5101 Primary insomnia: Secondary | ICD-10-CM | POA: Diagnosis not present

## 2022-07-27 ENCOUNTER — Other Ambulatory Visit: Payer: Self-pay | Admitting: Cardiovascular Disease

## 2022-07-28 ENCOUNTER — Encounter: Payer: Self-pay | Admitting: Cardiovascular Disease

## 2022-07-29 ENCOUNTER — Encounter: Payer: Self-pay | Admitting: Cardiovascular Disease

## 2022-07-29 ENCOUNTER — Ambulatory Visit: Payer: PPO | Attending: Cardiovascular Disease | Admitting: Cardiovascular Disease

## 2022-07-29 VITALS — BP 132/72 | HR 63 | Ht 73.0 in | Wt 217.2 lb

## 2022-07-29 DIAGNOSIS — E7800 Pure hypercholesterolemia, unspecified: Secondary | ICD-10-CM | POA: Diagnosis not present

## 2022-07-29 DIAGNOSIS — I2510 Atherosclerotic heart disease of native coronary artery without angina pectoris: Secondary | ICD-10-CM

## 2022-07-29 DIAGNOSIS — R7303 Prediabetes: Secondary | ICD-10-CM | POA: Diagnosis not present

## 2022-07-29 NOTE — Patient Instructions (Signed)
Medication Instructions:  Your physician recommends that you continue on your current medications as directed. Please refer to the Current Medication list given to you today.  *If you need a refill on your cardiac medications before your next appointment, please call your pharmacy*   Follow-Up: At [facility], you and your health needs are our priority.  As part of our continuing mission to provide you with exceptional heart care, we have created designated Provider Care Teams.  These Care Teams include your primary Cardiologist (physician) and Advanced Practice Providers (APPs -  Physician Assistants and Nurse Practitioners) who all work together to provide you with the care you need, when you need it.  We recommend signing up for the patient portal called "MyChart".  Sign up information is provided on this After Visit Summary.  MyChart is used to connect with patients for Virtual Visits (Telemedicine).  Patients are able to view lab/test results, encounter notes, upcoming appointments, etc.  Non-urgent messages can be sent to your provider as well.   To learn more about what you can do with MyChart, go to [email or web address].    Your next appointment:   12 month(s)  The format for your next appointment:   In Person  Provider:   Mihai Croitoru, MD 

## 2022-07-30 ENCOUNTER — Encounter: Payer: Self-pay | Admitting: Cardiovascular Disease

## 2022-07-30 NOTE — Progress Notes (Signed)
Cardiology Office Note   Date:  07/30/2022   ID:  JOHNATON Jenkins, DOB 1951/02/18, MRN [id number]   PCP:  Lujean Amel, MD  Cardiologist:  Sanda Klein, MD  Electrophysiologist:  None   Evaluation Performed:  Follow-Up Visit  Chief Complaint:  CAD  History of Present Illness:    Travis Jenkins is a 71 y.o. male with CAD now roughly  years after placement of a drug-eluting stent to the mid LAD artery (Resolute Onyx 3.5 x 12 mm, 05/01/2017).  His presentation was unusual, syncope after bee stings, with mild cardiac enzyme elevation.  He has had an uneventful year without any complaints of angina or dyspnea at rest or with activity.  He started swimming again, 1 day a week.  Continues to take care of all his own yard work.  He denies any problems with angina or dyspnea at rest with activity.  He has managed to lose several pounds since last year.  Denies dyspnea at rest, orthopnea, PND, lower extremity edema, claudication, erectile dysfunction, focal neurological complaints.  Has an excellent lipid profile with HDL 51 and LDL 61.  Both parameters have improved compared to last year.  His hemoglobin A1c is borderline at 6.1% consistent with prediabetes.   Past Medical History:  Diagnosis Date   Bradycardia 05/01/2017   CAD (coronary artery disease)    Coronary artery disease    IBS (irritable bowel syndrome)    Raynaud's disease    Syncope    Past Surgical History:  Procedure Laterality Date   ANKLE FRACTURE SURGERY     ANTERIOR CRUCIATE LIGAMENT REPAIR  1990   CARDIAC CATHETERIZATION     CHOLECYSTECTOMY N/A 02/17/2018   Procedure: LAPAROSCOPIC CHOLECYSTECTOMY;  Surgeon: Rolm Bookbinder, MD;  Location: [facility];  Service: General;  Laterality: N/A;   CORONARY STENT INTERVENTION N/A 05/01/2017   Procedure: CORONARY STENT INTERVENTION;  Surgeon: Nelva Bush, MD;  Location: [place] [facility];  Service: Cardiovascular;  Laterality: N/A;   INTRAVASCULAR PRESSURE WIRE/FFR  STUDY N/A 05/01/2017   Procedure: INTRAVASCULAR PRESSURE WIRE/FFR STUDY;  Surgeon: Nelva Bush, MD;  Location: [place] [facility];  Service: Cardiovascular;  Laterality: N/A;   LEFT HEART CATH AND CORONARY ANGIOGRAPHY N/A 05/01/2017   Procedure: LEFT HEART CATH AND CORONARY ANGIOGRAPHY;  Surgeon: Nelva Bush, MD;  Location: [place] [facility];  Service: Cardiovascular;  Laterality: N/A;   MASS EXCISION Right 02/07/2019   Procedure: RIGHT LONG FINGER EXCISION MASS;  Surgeon: Leanora Cover, MD;  Location: [place];  Service: Orthopedics;  Laterality: Right;     Current Meds  Medication Sig   aspirin EC 81 MG tablet Take 81 mg by mouth daily.   nitroGLYCERIN (NITROSTAT) 0.4 MG SL tablet PLACE 1 TABLET (0.4 MG TOTAL) UNDER THE TONGUE EVERY 5 (FIVE) MINUTES AS NEEDED FOR CHEST PAIN.   [DISCONTINUED] rosuvastatin (CRESTOR) 20 MG tablet TAKE 1 TABLET BY MOUTH EVERY DAY     Allergies:   Wasp venom, Erythromycin, and Tetracyclines & related   Social History   Tobacco Use   Smoking status: Former    Packs/day: 0.50    Years: 12.00    Total pack years: 6.00    Types: Cigarettes    Start date: 06/20/1966    Quit date: 08/19/1971    Years since quitting: 50.9   Smokeless tobacco: Never  Substance Use Topics   Alcohol use: Yes    Alcohol/week: 4.0 standard drinks of alcohol    Types: 4 Cans  of beer per week    Comment: social   Drug use: No     Family Hx: The patient's family history includes Diabetes in his father and mother; Heart disease in his father and mother; Hyperlipidemia in his father; Parkinson's disease in his mother.  ROS:   Please see the history of present illness.    All other systems are reviewed and are negative.   Prior CV studies:   The following studies were reviewed today:  Labs/Other Tests and Data Reviewed:    EKG: Ordered today and personally reviewed shows normal sinus rhythm, normal tracing  Recent Labs: No results found for  requested labs within last 365 days.  05/30/2019 Creatinine 1.08, potassium 4.1, normal LFTs, hemoglobin A1c 5.6% Recent Lipid Panel Lab Results  Component Value Date/Time   CHOL 107 02/12/2018 07:56 AM   TRIG 135 02/12/2018 07:56 AM   HDL 40 (L) 02/12/2018 07:56 AM   CHOLHDL 2.7 02/12/2018 07:56 AM   LDLCALC 40 02/12/2018 07:56 AM  05/30/2019 total cholesterol 125, HDL 41, LDL 53, triglycerides 151  07/02/2022 labs from PCP Cholesterol 131, HDL 51, LDL 61, triglycerides 101 Hemoglobin A1c 6.1%, hemoglobin 14.5, creatinine 1.23, potassium 4.8, ALT 14, TSH 3.00  Wt Readings from Last 3 Encounters:  07/29/22 98.5 kg  06/27/21 102 kg  05/11/20 103.1 kg     Objective:    Vital Signs:  BP 132/72   Pulse 63   Ht '6\' 1"'$  (1.854 m)   Wt 98.5 kg   SpO2 97%   BMI 28.66 kg/m      General: Alert, oriented x3, no distress, appears fit and younger than stated age Head: no evidence of trauma, PERRL, EOMI, no exophtalmos or lid lag, no myxedema, no xanthelasma; normal ears, nose and oropharynx Neck: normal jugular venous pulsations and no hepatojugular reflux; brisk carotid pulses without delay and no carotid bruits Chest: clear to auscultation, no signs of consolidation by percussion or palpation, normal fremitus, symmetrical and full respiratory excursions Cardiovascular: normal position and quality of the apical impulse, regular rhythm, normal first and second heart sounds, no murmurs, rubs or gallops Abdomen: no tenderness or distention, no masses by palpation, no abnormal pulsatility or arterial bruits, normal bowel sounds, no hepatosplenomegaly Extremities: no clubbing, cyanosis or edema; 2+ radial, ulnar and brachial pulses bilaterally; 2+ right femoral, posterior tibial and dorsalis pedis pulses; 2+ left femoral, posterior tibial and dorsalis pedis pulses; no subclavian or femoral bruits Neurological: grossly nonfocal Psych: Normal mood and affect    ASSESSMENT & PLAN:    1.  Coronary artery disease involving native coronary artery of native heart without angina pectoris   2. Hypercholesterolemia   3. Prediabetes       CAD: Asymptomatic.  Physically active.  On statin and aspirin but intolerant to beta-blockers due to bradycardia. HLP: His lipid parameters have improved with weight loss.  Continue statin.  Target LDL less than 70. Prediabetes: Maintains evidence of insulin resistance despite weight loss.  Discussed ways to improve glycemic control by avoiding sugary and starchy foods, especially severely white starches with high glycemic index.  Continue and increase physical activity.  Patient Instructions  Medication Instructions:  Your physician recommends that you continue on your current medications as directed. Please refer to the Current Medication list given to you today.  *If you need a refill on your cardiac medications before your next appointment, please call your pharmacy*  Follow-Up: At [facility], you and your health needs are our priority.  As part of our continuing mission to provide you with exceptional heart care, we have created designated Provider Care Teams.  These Care Teams include your primary Cardiologist (physician) and Advanced Practice Providers (APPs -  Physician Assistants and Nurse Practitioners) who all work together to provide you with the care you need, when you need it.  We recommend signing up for the patient portal called "MyChart".  Sign up information is provided on this After Visit Summary.  MyChart is used to connect with patients for Virtual Visits (Telemedicine).  Patients are able to view lab/test results, encounter notes, upcoming appointments, etc.  Non-urgent messages can be sent to your provider as well.   To learn more about what you can do with MyChart, go to [email or web address].    Your next appointment:   12 month(s)  The format for your next appointment:   In Person  Provider:   Sanda Klein, MD    Signed, Sanda Klein, MD  07/30/2022 7:12 PM    [place]

## 2022-09-17 DIAGNOSIS — Z85828 Personal history of other malignant neoplasm of skin: Secondary | ICD-10-CM | POA: Diagnosis not present

## 2022-09-17 DIAGNOSIS — B351 Tinea unguium: Secondary | ICD-10-CM | POA: Diagnosis not present

## 2022-09-17 DIAGNOSIS — Z08 Encounter for follow-up examination after completed treatment for malignant neoplasm: Secondary | ICD-10-CM | POA: Diagnosis not present

## 2022-12-11 DIAGNOSIS — M25572 Pain in left ankle and joints of left foot: Secondary | ICD-10-CM | POA: Diagnosis not present

## 2022-12-22 DIAGNOSIS — S86201D Unspecified injury of muscle(s) and tendon(s) of anterior muscle group at lower leg level, right leg, subsequent encounter: Secondary | ICD-10-CM | POA: Diagnosis not present

## 2022-12-25 ENCOUNTER — Ambulatory Visit (INDEPENDENT_AMBULATORY_CARE_PROVIDER_SITE_OTHER): Payer: PPO | Admitting: Orthopaedic Surgery

## 2022-12-25 ENCOUNTER — Other Ambulatory Visit (INDEPENDENT_AMBULATORY_CARE_PROVIDER_SITE_OTHER): Payer: PPO

## 2022-12-25 DIAGNOSIS — M545 Low back pain, unspecified: Secondary | ICD-10-CM

## 2022-12-25 DIAGNOSIS — S86201D Unspecified injury of muscle(s) and tendon(s) of anterior muscle group at lower leg level, right leg, subsequent encounter: Secondary | ICD-10-CM | POA: Diagnosis not present

## 2022-12-25 DIAGNOSIS — M25551 Pain in right hip: Secondary | ICD-10-CM | POA: Diagnosis not present

## 2022-12-25 NOTE — Progress Notes (Signed)
The patient is someone him seeing for the first time as the patient but I have operated on his wife before.  He is an active 72 year old gentleman who does play pickle ball regularly.  He has been having right hip pain he said for about 20 years now but prior to a year ago he was just told to really live with it.  He does not state that there is some groin pain but most of his pain seems to be in the low back and the right iliac crest and pelvis is where he points to.  He says he has a hard time laying on that side as well.  Is been slowly getting worse for him.  He takes anti-inflammatories on occasion.  Examination of his right hip is entirely normal.  He has full internal and external rotation passively and actively of the right hip and there is no pain in the groin and there is no pain to palpation of the trochanteric area or the IT band.  He does have pain to palpation along the oblique muscles and the right iliac crest.  He is able to flex and extend the lumbar spine but he does have pain when he does so.  He does not have a positive straight leg raise and no radicular symptoms.  As we are talking to him he did let us know that he had injured his left Achilles playing pickle ball about a week and a half ago.  He was actually seen at an orthopedic urgent care clinic and the physical therapist has evaluated him and they have been physical therapy which she says he is doing better from that aspect of things.  When I do have him lay on his abdomen, he has a positive Thompson exam/Thompson test on his left side but his pain is at the musculotendinous junction.  The distal Achilles is intact.  An AP pelvis and lateral of the right hip do not show any significant acute findings arthritic changes that are minimal.  X-rays of his lumbar spine show significant degenerative disc disease at L4-L5 and L5-S1 with arthritic changes in the posterior elements.  I would like to actually send him to my partner Dr. [name]  for evaluation of his oblique muscles along the right iliac crest and considering ultrasound and sonographic treatment of this area.  Also feel that it would be appropriate for Dr. [name] to look at his left Achilles tendon and consider treatments on this area as well.  The patient absolutely agrees with this treatment plan.  Will defer further care to Dr. [name] for those areas.  If his back worsens my next step for the back would be considering outpatient physical therapy as well as potentially MRI if needed.

## 2022-12-26 ENCOUNTER — Ambulatory Visit: Payer: PPO | Admitting: Sports Medicine

## 2022-12-26 ENCOUNTER — Other Ambulatory Visit: Payer: Self-pay

## 2022-12-26 ENCOUNTER — Encounter: Payer: Self-pay | Admitting: Sports Medicine

## 2022-12-26 DIAGNOSIS — M25551 Pain in right hip: Secondary | ICD-10-CM | POA: Diagnosis not present

## 2022-12-26 DIAGNOSIS — M25572 Pain in left ankle and joints of left foot: Secondary | ICD-10-CM | POA: Diagnosis not present

## 2022-12-26 DIAGNOSIS — G8929 Other chronic pain: Secondary | ICD-10-CM | POA: Diagnosis not present

## 2022-12-26 DIAGNOSIS — S86012A Strain of left Achilles tendon, initial encounter: Secondary | ICD-10-CM | POA: Diagnosis not present

## 2022-12-26 NOTE — Progress Notes (Signed)
Right lateral hip/side pain -trouble sleeping  Left achilles pain- 2 weeks of pain Injury pickleball Walking with limp Swells with overuse

## 2022-12-26 NOTE — Progress Notes (Signed)
Travis Jenkins - 72 y.o. male MRN [id number]  Date of birth: 1951-08-10  Office Visit Note: Visit Date: 12/26/2022 PCP: Darrow Bussing, MD Referred by: Darrow Bussing, MD  Subjective: Chief Complaint  Patient presents with   Left Ankle - Pain   HPI: Travis Jenkins is a pleasant 71 y.o. male who presents today for left achilles pain and right hip/iliac crest pain.  Saw my partner Dr. Magnus Ivan the other day, he may benefit from ultrasound evaluation.  Did injure the left Achilles playing pickle ball almost 2 weeks ago.  He had initially been seen at [facility] urgent care and was told that he had a sprain.  However he notices rather significant swelling and did have ecchymosis that is starting to resolve.  He has been able to walk but gingerly and with some mild pain.  He points to pain over the right hip near the lateral and posterior iliac crest.  He states this has bothered him on and off for about 20 years.  Here recently he stopped sleeping on his right side and his pain is essentially went away.  Wants reassurance his hip joint looks ok.  Pertinent ROS were reviewed with the patient and found to be negative unless otherwise specified above in HPI.   Assessment & Plan: Visit Diagnoses:  1. Partial Achilles tendon tear, left, initial encounter   2. Acute left ankle pain   3. Chronic right hip pain    Plan: Discussed with Travis Jenkins that based on his exam and ultrasound examination that he did suffer a tear of the proximal Achilles tendon at the myotendinous junction.  There is no significant retraction but there is is a high-grade tear that is near full-thickness based on ultrasound.  We did discuss putting him into a cam walker boot, but he has little to no pain walking in regular tennis shoes.  Through-shared decision making, decided instead to place shoe inserts with a heel lift in both shoes to offload the Achilles tendon.  I do want him to discontinue physical therapy at this  time until we see each other back in about 3 weeks.  We will then start getting him into some therapy without resistance or strength training until about the 6-week mark.  He will ice elevate he can take over-the-counter anti-inflammatories as needed.  We did give him some exercises to help stabilize the hip and the posterior chain.  Discussed with him today I do not think this is coming from arthritis or the intra-articular hip joint.  Will continue activity modification, if he is still having back pain or posterior hip pain, it may make sense to get an MRI of the back which Dr. Magnus Ivan already discussed.  He will follow-up with me in 3 weeks for the Achilles. May consider trial of ECSWT at that time.  Follow-up: Return in about 3 weeks (around 01/16/2023) for for achilles f/u .   Meds & Orders: No orders of the defined types were placed in this encounter.   Orders Placed This Encounter  Procedures   [place] Extrem Low Left Ltd     Procedures: No procedures performed      Clinical History: No specialty comments available.  He reports that he quit smoking about 51 years ago. His smoking use included cigarettes. He started smoking about 56 years ago. He has a 6.00 pack-year smoking history. He has never used smokeless tobacco. No results for input(s): "HGBA1C", "LABURIC" in the last 8760 hours.  Objective:  Vital Signs:  Physical Exam  Gen: Well-appearing, in no acute distress; non-toxic CV: Well-perfused. Warm.  Resp: Breathing unlabored on room air; no wheezing. Psych: Fluid speech in conversation; appropriate affect; normal thought process Neuro: Sensation intact throughout. No gross coordination deficits.   Ortho Exam - Left achilles/leg: No tenderness at the distal Achilles, able to palpate the tendon without step-off.  Near the mid to proximal myotendinous junction there is significant bogginess, swelling and some resolving ecchymosis with tenderness to palpation here.  Positive  Thompson's squeeze test.  He is able to slowly perform bilateral heel raise.   - Right hip: Hip shows no erythema, swelling or bruising.  There is very mild TTP over the posterior lateral aspect of the iliac crest.  The hip moves fluidly without mechanical blocks.  He has rather good strength with lateral hip abduction on the right slightly worse than left.  5/5 strength otherwise.  Imaging:  [place] Extrem Low Left Ltd Limited musculoskeletal ultrasound of the left lower extremity, left  Achilles and posterior leg was performed today.  Evaluation of the distal  Achilles both in short and long axis shows proper insertion over the  calcaneus without tendon irregularity.  Has rescan to the mid and proximal  aspect of the Achilles there is a high-grade tear near the myotendinous  junction with significant hypoechoic fluid in hyperemia.  There is no  significant tendon retraction, although this is near full-thickness.   Evaluation proximally of the gastroc and soleus shows no abnormality in  the mid belly.  Plantaris was visualized without abnormality on limited  view.       XR Lumbar Spine 2-3 Views  Result Date: 12/25/2022 An AP and lateral of the lumbar spine shows degenerative disc disease between L4 and L5 and L5 and S1.  There is also arthritic changes in the posterior elements of the lower lumbar spine.  XR HIP UNILAT W OR W/O PELVIS 2-3 VIEWS RIGHT  Result Date: 12/25/2022 An AP pelvis and lateral right hip shows no acute findings.  There is minimal arthritic changes.   Past Medical/Family/Surgical/Social History: Medications & Allergies reviewed per EMR, new medications updated. Patient Active Problem List   Diagnosis Date Noted   Preoperative cardiovascular examination    Abdominal pain 02/12/2018   IBS (irritable bowel syndrome) 02/12/2018   Cholelithiasis 02/12/2018   Elevated glucose 02/12/2018   Elevated serum creatinine 02/12/2018   CAD (coronary artery disease)  08/25/2017   NSTEMI (non-ST elevated myocardial infarction) (HCC)    Elevated troponin 04/30/2017   Leukocytosis 04/30/2017   Hypophosphatemia 04/30/2017   CKD (chronic kidney disease) stage 2, GFR 60-89 ml/min 04/30/2017   Hypercholesterolemia 04/30/2017   Anaphylaxis 04/29/2017   Chest pain    Syncope 02/15/2014   Bradycardia 02/15/2014   Past Medical History:  Diagnosis Date   Bradycardia 05/01/2017   CAD (coronary artery disease)    Coronary artery disease    IBS (irritable bowel syndrome)    Raynaud's disease    Syncope    Family History  Problem Relation Age of Onset   Hyperlipidemia Father    Diabetes Father    Heart disease Father    Diabetes Mother    Parkinson's disease Mother    Heart disease Mother    Past Surgical History:  Procedure Laterality Date   ANKLE FRACTURE SURGERY     ANTERIOR CRUCIATE LIGAMENT REPAIR  1990   CARDIAC CATHETERIZATION     CHOLECYSTECTOMY N/A 02/17/2018   Procedure: LAPAROSCOPIC CHOLECYSTECTOMY;  Surgeon: Emelia Loron, MD;  [facility];  Service: General;  Laterality: N/A;   CORONARY PRESSURE/FFR STUDY N/A 05/01/2017   Procedure: INTRAVASCULAR PRESSURE WIRE/FFR STUDY;  Surgeon: Yvonne Kendall, MD;  Location: [facility];  Service: Cardiovascular;  Laterality: N/A;   CORONARY STENT INTERVENTION N/A 05/01/2017   Procedure: CORONARY STENT INTERVENTION;  Surgeon: Yvonne Kendall, MD;  Location: [facility];  Service: Cardiovascular;  Laterality: N/A;   LEFT HEART CATH AND CORONARY ANGIOGRAPHY N/A 05/01/2017   Procedure: LEFT HEART CATH AND CORONARY ANGIOGRAPHY;  Surgeon: Yvonne Kendall, MD;  Location: [facility];  Service: Cardiovascular;  Laterality: N/A;   MASS EXCISION Right 02/07/2019   Procedure: RIGHT LONG FINGER EXCISION MASS;  Surgeon: Betha Loa, MD;  Location: [facility];  Service: Orthopedics;  Laterality: Right;   Social History   Occupational History   Not on file  Tobacco  Use   Smoking status: Former    Packs/day: 0.50    Years: 12.00    Additional pack years: 0.00    Total pack years: 6.00    Types: Cigarettes    Start date: 06/20/1966    Quit date: 08/19/1971    Years since quitting: 51.3   Smokeless tobacco: Never  Substance and Sexual Activity   Alcohol use: Yes    Alcohol/week: 4.0 standard drinks of alcohol    Types: 4 Cans of beer per week    Comment: social   Drug use: No   Sexual activity: Not on file   I spent 38 minutes in the care of the patient today including face-to-face time, preparation to see the patient, as well as review of prior orthopedic note, gait analysis, counseling on rehab and home exercise therapy for the above diagnoses.   Madelyn Brunner, DO Primary Care Sports Medicine Physician  [facility] - Orthopedics  This note was dictated using Dragon naturally speaking software and may contain errors in syntax, spelling, or content which have not been identified prior to signing this note.

## 2023-01-01 ENCOUNTER — Telehealth: Payer: Self-pay

## 2023-01-01 NOTE — Telephone Encounter (Signed)
   Pre-operative Risk Assessment    Patient Name: Travis Jenkins  DOB: 07-02-1951 MRN: [id number]      Request for Surgical Clearance    Procedure:   Colonoscopy   Date of Surgery:  03/09/23                                 Surgeon:  Dr.Vreeland Surgeon's Group or Practice Name:  [facility]  Phone number:  [phone] Fax number:  [phone]   Type of Clearance Requested:   - Medical  - Pharmacy:  Hold Aspirin     Type of Anesthesia:   Propofol   Additional requests/questions:   N/A  SignedStevan Born   01/01/2023, 4:38 PM

## 2023-01-02 NOTE — Telephone Encounter (Signed)
Primary Cardiologist:Mihai Croitoru, MD  Chart reviewed as part of pre-operative protocol coverage. Because of Javelle Capito Odom's past medical history and time since last visit, he/she will require a follow-up visit in order to better assess preoperative cardiovascular risk.  Pre-op covering staff: - Please schedule appointment and call patient to inform them. - Please contact requesting surgeon's office via preferred method (i.e, phone, fax) to inform them of need for appointment prior to surgery.  Per office protocol and pending no symptoms of ACS, he may hold aspirin for 5-7 days prior to procedure and should resume as soon as hemodynamically stable postoperatively.   Levi Aland, NP-C  01/02/2023, 11:35 AM [address], [address] Office [phone] Fax [phone]

## 2023-01-02 NOTE — Telephone Encounter (Signed)
I left a message for the patient to call our office to schedule an in-office visit for pre-op clearance.

## 2023-01-05 ENCOUNTER — Encounter: Payer: Self-pay | Admitting: Cardiovascular Disease

## 2023-01-05 DIAGNOSIS — R739 Hyperglycemia, unspecified: Secondary | ICD-10-CM | POA: Diagnosis not present

## 2023-01-05 DIAGNOSIS — R7309 Other abnormal glucose: Secondary | ICD-10-CM | POA: Diagnosis not present

## 2023-01-05 NOTE — Telephone Encounter (Signed)
Called patient Travis Jenkins and informed him that I was calling to get him scheduled for an in office appointment for his upcoming 03/09/23. Patient stated that he thought it was odd that he would need to come into the office of a colonoscopy and will reach out to Dr. Royann Shivers on if it is necessary for him to come into the office and if he says that it is needed that he will give the office a call back to get scheduled for an appointment

## 2023-01-06 ENCOUNTER — Encounter: Payer: Self-pay | Admitting: Cardiovascular Disease

## 2023-01-06 NOTE — Telephone Encounter (Signed)
I took care of it. I sent a letter via epic to PCP and [facility]. No need for appt. Thanks

## 2023-01-06 NOTE — Telephone Encounter (Signed)
I left a message for the patient to call our office to schedule a tele visit for pre-op 

## 2023-01-06 NOTE — Telephone Encounter (Signed)
   Name: Travis Jenkins  DOB: October 01, 1950  MRN: [id number]  Primary Cardiologist: Thurmon Fair, MD   Preoperative team, please contact this patient and set up a phone call appointment for further preoperative risk assessment. Please obtain consent and complete medication review. Thank you for your help.  Original request was for office visit, however, I do not think this is necessary and televisit will suffice.  I confirm that guidance regarding antiplatelet and oral anticoagulation therapy has been completed and, if necessary, noted below.  Per office protocol, if patient is without any new symptoms or concerns at the time of their virtual visit, he may hold Aspirin for 5-7 days prior to procedure. Please resume Aspirin as soon as possible postprocedure, at the discretion of the surgeon.       Joylene Grapes, NP 01/06/2023, 8:50 AM [facility]

## 2023-01-09 ENCOUNTER — Ambulatory Visit (INDEPENDENT_AMBULATORY_CARE_PROVIDER_SITE_OTHER): Payer: PPO | Admitting: Sports Medicine

## 2023-01-09 ENCOUNTER — Encounter: Payer: Self-pay | Admitting: Sports Medicine

## 2023-01-09 DIAGNOSIS — S86012D Strain of left Achilles tendon, subsequent encounter: Secondary | ICD-10-CM | POA: Diagnosis not present

## 2023-01-09 NOTE — Progress Notes (Signed)
Doing better; states his leg is still swelling Not as painful, doesn't need ice or anything  Just elevates leg at night to help

## 2023-01-09 NOTE — Progress Notes (Signed)
Travis Jenkins - 72 y.o. male MRN [id number]  Date of birth: Jun 05, 1951  Office Visit Note: Visit Date: 01/09/2023 PCP: Darrow Bussing, MD Referred by: Darrow Bussing, MD  Subjective: Chief Complaint  Patient presents with   Left Leg - Follow-up   HPI: Travis Jenkins is a pleasant 72 y.o. male who presents today for left proximal achilles tear/injury.  Injured left achilles playing pickleball about 1 month ago. [name] is making improvements, has much less pain and more function.  He has been holding from physical therapy at my discretion. Continues in the heel lifts.   Pertinent ROS were reviewed with the patient and found to be negative unless otherwise specified above in HPI.   Assessment & Plan: Visit Diagnoses:  1. Partial Achilles tendon tear, left, subsequent encounter    Plan: [place]aun is about 1 month out from his Achilles tendon proximal tear.  He is having improvement in pain and function.  Now that he is 4 weeks out, we did do a trial of extracorporeal shockwave therapy.  We will start him on some home therapy with arch curls, and plantarflexion based activity.  Will continue to hold him from ankle resistance training and dorsiflexion activity. A customized handout was provided today with exercises and I did review in the room with him today his rehab exercises for Weeks 4-6 from his injury.  Discussed that likely at the 6-week mark, will proceed with full ankle range of motion as well as beginning dynamic strengthening with plantarflexion and advancing to dorsiflexion based exercises as well.  Like to begin biking, elliptical and aquatic therapy at the 6-week mark as well as he continues to make improvement.  He will follow-up next week for an additional extracorporeal shockwave trial and reevaluation.   Follow-up: Return in about 1 week (around 01/16/2023) for for left achilles .   Meds & Orders: No orders of the defined types were placed in this encounter.  No orders of the  defined types were placed in this encounter.    Procedures: Procedure: ECSWT Indications:  Achilles tendon injury/tear   Procedure Details Consent: Risks of procedure as well as the alternatives and risks of each were explained to the patient.  Verbal consent for procedure obtained. Time Out: Verified patient identification, verified procedure, site was marked, verified correct patient position. The area was cleaned with alcohol swab.     The left achilles and MT-junction was targeted for Extracorporeal shockwave therapy.    Preset: achillodynia Power Level: 110 mJ Frequency: 12-14Hz  Impulse/cycles: 2800 Head size: Regular   Patient tolerated procedure well without immediate complications.         Clinical History: No specialty comments available.  He reports that he quit smoking about 51 years ago. His smoking use included cigarettes. He started smoking about 56 years ago. He has a 6.00 pack-year smoking history. He has never used smokeless tobacco. No results for input(s): "HGBA1C", "LABURIC" in the last 8760 hours.  Objective:   Vital Signs: There were no vitals taken for this visit.  Physical Exam  Gen: Well-appearing, in no acute distress; non-toxic CV: Well-perfused. Warm.  Resp: Breathing unlabored on room air; no wheezing. Psych: Fluid speech in conversation; appropriate affect; normal thought process Neuro: Sensation intact throughout. No gross coordination deficits.   Ortho Exam - LLE: There are some mild tenderness at the proximal Achilles tendon near the myotendinous junction.  There continues to be soft tissue swelling of the left lower extremity.  Neurovascular intact distally  at the foot.  He does have active plantar and dorsiflexion intact.  Imaging: No results found.  Past Medical/Family/Surgical/Social History: Medications & Allergies reviewed per EMR, new medications updated. Patient Active Problem List   Diagnosis Date Noted   Preoperative  cardiovascular examination    Abdominal pain 02/12/2018   IBS (irritable bowel syndrome) 02/12/2018   Cholelithiasis 02/12/2018   Elevated glucose 02/12/2018   Elevated serum creatinine 02/12/2018   CAD (coronary artery disease) 08/25/2017   NSTEMI (non-ST elevated myocardial infarction) (HCC)    Elevated troponin 04/30/2017   Leukocytosis 04/30/2017   Hypophosphatemia 04/30/2017   CKD (chronic kidney disease) stage 2, GFR 60-89 ml/min 04/30/2017   Hypercholesterolemia 04/30/2017   Anaphylaxis 04/29/2017   Chest pain    Syncope 02/15/2014   Bradycardia 02/15/2014   Past Medical History:  Diagnosis Date   Bradycardia 05/01/2017   CAD (coronary artery disease)    Coronary artery disease    IBS (irritable bowel syndrome)    Raynaud's disease    Syncope    Family History  Problem Relation Age of Onset   Hyperlipidemia Father    Diabetes Father    Heart disease Father    Diabetes Mother    Parkinson's disease Mother    Heart disease Mother    Past Surgical History:  Procedure Laterality Date   ANKLE FRACTURE SURGERY     ANTERIOR CRUCIATE LIGAMENT REPAIR  1990   CARDIAC CATHETERIZATION     CHOLECYSTECTOMY N/A 02/17/2018   Procedure: LAPAROSCOPIC CHOLECYSTECTOMY;  Surgeon: Emelia Loron, MD;  Location: [facility] OR;  Service: General;  Laterality: N/A;   CORONARY PRESSURE/FFR STUDY N/A 05/01/2017   Procedure: INTRAVASCULAR PRESSURE WIRE/FFR STUDY;  Surgeon: Yvonne Kendall, MD;  Location: [facility];  Service: Cardiovascular;  Laterality: N/A;   CORONARY STENT INTERVENTION N/A 05/01/2017   Procedure: CORONARY STENT INTERVENTION;  Surgeon: Yvonne Kendall, MD;  Location: [facility];  Service: Cardiovascular;  Laterality: N/A;   LEFT HEART CATH AND CORONARY ANGIOGRAPHY N/A 05/01/2017   Procedure: LEFT HEART CATH AND CORONARY ANGIOGRAPHY;  Surgeon: Yvonne Kendall, MD;  Location: [facility];  Service: Cardiovascular;  Laterality: N/A;   MASS EXCISION Right  02/07/2019   Procedure: RIGHT LONG FINGER EXCISION MASS;  Surgeon: Betha Loa, MD;  Location: [facility];  Service: Orthopedics;  Laterality: Right;   Social History   Occupational History   Not on file  Tobacco Use   Smoking status: Former    Packs/day: 0.50    Years: 12.00    Additional pack years: 0.00    Total pack years: 6.00    Types: Cigarettes    Start date: 06/20/1966    Quit date: 08/19/1971    Years since quitting: 51.4   Smokeless tobacco: Never  Substance and Sexual Activity   Alcohol use: Yes    Alcohol/week: 4.0 standard drinks of alcohol    Types: 4 Cans of beer per week    Comment: social   Drug use: No   Sexual activity: Not on file   Patient was instructed in 10 minutes of therapeutic exercises for left achilles tendon injury to improve strength, ROM and function according to my instructions and plan of care during the office visit. A customized handout was provided and demonstration of proper technique shown and discussed. Patient perform exercises and demonstrated understanding.  All questions discussed and answered. This is a one-time charge but we will continue to update and modify his home rehab at each  subsequent visit follow-up.  Madelyn Brunner, DO Primary Care Sports Medicine Physician  [facility] - Orthopedics  This note was dictated using Dragon naturally speaking software and may contain errors in syntax, spelling, or content which have not been identified prior to signing this note.

## 2023-01-15 ENCOUNTER — Ambulatory Visit (INDEPENDENT_AMBULATORY_CARE_PROVIDER_SITE_OTHER): Payer: PPO | Admitting: Sports Medicine

## 2023-01-15 ENCOUNTER — Encounter: Payer: Self-pay | Admitting: Sports Medicine

## 2023-01-15 DIAGNOSIS — S86012D Strain of left Achilles tendon, subsequent encounter: Secondary | ICD-10-CM

## 2023-01-15 NOTE — Progress Notes (Signed)
States he is doing better  

## 2023-01-15 NOTE — Progress Notes (Signed)
Travis Jenkins - 72 y.o. male MRN [id number]  Date of birth: [birth year]  Office Visit Note: Visit Date: 01/15/2023 PCP: Darrow Bussing, MD Referred by: Darrow Bussing, MD  Subjective: Chief Complaint  Patient presents with   Left Heel - Follow-up   HPI: Travis Jenkins is a pleasant 72 y.o. male who presents today for left proximal Achilles tear/injury.  He is about 5 weeks out from his injury which occurred playing pickle ball.  He continues making improvements with less pain and more function.  Has been able to walk and workout in his workshop.  Still holding on significant physical activity and dorsiflexion exercises.  Continues in the heel lifts and has tolerated his plantarflexion based home exercises.  Pertinent ROS were reviewed with the patient and found to be negative unless otherwise specified above in HPI.   Assessment & Plan: Visit Diagnoses:  1. Partial Achilles tendon tear, left, subsequent encounter    Plan: [name] continues to make improvements from his proximal Achilles tendon/myotendinous junction tear.  Did perform second trial of extracorporeal shockwave therapy to help augment healing.  He will continue on his heel lifts and his home exercises with arch curls and plantarflexion based exercises only.  Will bring him back in just over a week where he will be at least 6 weeks out from his injury, at that point we will take him through more dynamic testing and likely start allowing him to begin dynamic strengthening with plantarflexion and then advancing to dorsiflexion based exercises as well.  He was asking about biking and aquatic therapy, we will likely start him on a graded return after next visit but will hold until at least the 6-week mark.  May use ice/heat or over-the-counter ibuprofen only as needed for pain.  Follow-up: Return in about 1 week (around 01/22/2023) for for achilles - allow 30-min appt for gait analysis and functional testing.   Meds & Orders: No  orders of the defined types were placed in this encounter.  No orders of the defined types were placed in this encounter.    Procedures: Procedure: ECSWT Indications:  Achilles tendon injury/tear   Procedure Details Consent: Risks of procedure as well as the alternatives and risks of each were explained to the patient.  Verbal consent for procedure obtained. Time Out: Verified patient identification, verified procedure, site was marked, verified correct patient position. The area was cleaned with alcohol swab.     The left achilles and MT-junction was targeted for Extracorporeal shockwave therapy.    Preset: achillodynia Power Level: 110 mJ Frequency: 14 Hz Impulse/cycles: 3000 Head size: Regular   Patient tolerated procedure well without immediate complications.      Clinical History: No specialty comments available.  He reports that he quit smoking about 51 years ago. His smoking use included cigarettes. He started smoking about 56 years ago. He has a 6.00 pack-year smoking history. He has never used smokeless tobacco. No results for input(s): "HGBA1C", "LABURIC" in the last 8760 hours.  Objective:   Vital Signs: There were no vitals taken for this visit.  Physical Exam  Gen: Well-appearing, in no acute distress; non-toxic CV:  Well-perfused. Warm.  Resp: Breathing unlabored on room air; no wheezing. Psych: Fluid speech in conversation; appropriate affect; normal thought process Neuro: Sensation intact throughout. No gross coordination deficits.   Ortho Exam - Left leg: Very mild tenderness to the proximal Achilles tendon near the myotendinous junction but improved from prior visits.  Soft tissue swelling is  still present but certainly improving from prior visits.  He is neuro vastly intact distally at the foot.  He does have active plantar and dorsiflexion.  Strength testing not performed today.  Imaging: No results found.  Past Medical/Family/Surgical/Social  History: Medications & Allergies reviewed per EMR, new medications updated. Patient Active Problem List   Diagnosis Date Noted   Preoperative cardiovascular examination    Abdominal pain 02/12/2018   IBS (irritable bowel syndrome) 02/12/2018   Cholelithiasis 02/12/2018   Elevated glucose 02/12/2018   Elevated serum creatinine 02/12/2018   CAD (coronary artery disease) 08/25/2017   NSTEMI (non-ST elevated myocardial infarction) (HCC)    Elevated troponin 04/30/2017   Leukocytosis 04/30/2017   Hypophosphatemia 04/30/2017   CKD (chronic kidney disease) stage 2, GFR 60-89 ml/min 04/30/2017   Hypercholesterolemia 04/30/2017   Anaphylaxis 04/29/2017   Chest pain    Syncope 02/15/2014   Bradycardia 02/15/2014   Past Medical History:  Diagnosis Date   Bradycardia 05/01/2017   CAD (coronary artery disease)    Coronary artery disease    IBS (irritable bowel syndrome)    Raynaud's disease    Syncope    Family History  Problem Relation Age of Onset   Hyperlipidemia Father    Diabetes Father    Heart disease Father    Diabetes Mother    Parkinson's disease Mother    Heart disease Mother    Past Surgical History:  Procedure Laterality Date   ANKLE FRACTURE SURGERY     ANTERIOR CRUCIATE LIGAMENT REPAIR  1990   CARDIAC CATHETERIZATION     CHOLECYSTECTOMY N/A 02/17/2018   Procedure: LAPAROSCOPIC CHOLECYSTECTOMY;  Surgeon: Emelia Loron, MD;  Location: [facility] OR;  Service: General;  Laterality: N/A;   CORONARY PRESSURE/FFR STUDY N/A 05/01/2017   Procedure: INTRAVASCULAR PRESSURE WIRE/FFR STUDY;  Surgeon: Yvonne Kendall, MD;  Location: [facility];  Service: Cardiovascular;  Laterality: N/A;   CORONARY STENT INTERVENTION N/A 05/01/2017   Procedure: CORONARY STENT INTERVENTION;  Surgeon: Yvonne Kendall, MD;  Location: [facility];  Service: Cardiovascular;  Laterality: N/A;   LEFT HEART CATH AND CORONARY ANGIOGRAPHY N/A 05/01/2017   Procedure: LEFT HEART CATH AND  CORONARY ANGIOGRAPHY;  Surgeon: Yvonne Kendall, MD;  Location: [facility];  Service: Cardiovascular;  Laterality: N/A;   MASS EXCISION Right 02/07/2019   Procedure: RIGHT LONG FINGER EXCISION MASS;  Surgeon: Betha Loa, MD;  Location: [facility];  Service: Orthopedics;  Laterality: Right;   Social History   Occupational History   Not on file  Tobacco Use   Smoking status: Former    Packs/day: 0.50    Years: 12.00    Additional pack years: 0.00    Total pack years: 6.00    Types: Cigarettes    Start date: 06/20/1966    Quit date: 08/19/1971    Years since quitting: 51.4   Smokeless tobacco: Never  Substance and Sexual Activity   Alcohol use: Yes    Alcohol/week: 4.0 standard drinks of alcohol    Types: 4 Cans of beer per week    Comment: social   Drug use: No   Sexual activity: Not on file

## 2023-01-27 ENCOUNTER — Encounter: Payer: Self-pay | Admitting: Sports Medicine

## 2023-01-27 ENCOUNTER — Ambulatory Visit (INDEPENDENT_AMBULATORY_CARE_PROVIDER_SITE_OTHER): Payer: PPO | Admitting: Sports Medicine

## 2023-01-27 DIAGNOSIS — S86012D Strain of left Achilles tendon, subsequent encounter: Secondary | ICD-10-CM | POA: Diagnosis not present

## 2023-01-27 NOTE — Progress Notes (Signed)
Travis Jenkins - 72 y.o. male MRN [id number]  Date of birth: 06-15-1951  Office Visit Note: Visit Date: 01/27/2023 PCP: Darrow Bussing, MD Referred by: Darrow Bussing, MD  Subjective: Chief Complaint  Patient presents with   Left Leg - Follow-up   HPI: Travis Jenkins is a pleasant 72 y.o. male who presents today for left proximal Achilles tear.  [name] is almost 7 weeks out from his initial injury which occurred playing pickle ball.  He did suffer a high-grade near full-thickness tear of the proximal Achilles at the myotendinous junction.  He has been tolerating shockwave as well as plantarflexion based stretching and strengthening exercises, has not been any dorsiflexion exercises yet based on my discretion.  His pain is significantly improved, still feels somewhat weak however.  Pertinent ROS were reviewed with the patient and found to be negative unless otherwise specified above in HPI.   Assessment & Plan: Visit Diagnoses:  1. Partial Achilles tendon tear, left, subsequent encounter    Plan: Discussed with [name] he is healing appropriately from his high-grade proximal Achilles tear at the myotendinous junction.  He is now between 6-7 weeks from his initial injury - has been tolerating plantarflexion based Thera-Band and strengthening exercises as well as ankle range of motion.  Given that he is past 6 weeks and improving, I would like him to get started in formalized physical therapy to work on strengthening of the calf/Achilles, I am okay with him continuing plantarflexion strengthening exercises as well as including dorsiflexion exercises now with advancement per PT discretion.  I did add bilateral calf raises for him to start in the meantime before starting PT.  He will advance to exercises such as elliptical, stationary bicycle and swimming.  Would hold on golfing, running and more dynamic activity until I see him back in about 1 month.  Discussed with [name] this is usually about 12  weeks before full return to activity.  Did perform extracorporeal shockwave treatment as well to augment healing and improve soft tissue swelling/scar tissue.  We will follow-up in 1 month and we will repeat ultrasound of the Achilles to evaluate healing quality.  Follow-up: Return in about 4 weeks (around 02/24/2023) for [place] achilles (30-min).   Meds & Orders: No orders of the defined types were placed in this encounter.  No orders of the defined types were placed in this encounter.    Procedures: Procedure: ECSWT Indications:  Achilles tendon injury/tear   Procedure Details Consent: Risks of procedure as well as the alternatives and risks of each were explained to the patient.  Verbal consent for procedure obtained. Time Out: Verified patient identification, verified procedure, site was marked, verified correct patient position. The area was cleaned with alcohol swab.     The left achilles and MT-junction was targeted for Extracorporeal shockwave therapy.    Preset: achillodynia Power Level: 120 mJ Frequency: 14 Hz Impulse/cycles: 3400 Head size: Regular   Patient tolerated procedure well without immediate complications.       Clinical History: No specialty comments available.  He reports that he quit smoking about 51 years ago. His smoking use included cigarettes. He started smoking about 56 years ago. He has a 6.00 pack-year smoking history. He has never used smokeless tobacco. No results for input(s): "HGBA1C", "LABURIC" in the last 8760 hours.  Objective:   Vital Signs: There were no vitals taken for this visit.  Physical Exam  Gen: Well-appearing, in no acute distress; non-toxic CV: Well-perfused. Warm.  Resp:  Breathing unlabored on room air; no wheezing. Psych: Fluid speech in conversation; appropriate affect; normal thought process Neuro: Sensation intact throughout. No gross coordination deficits.   Ortho Exam - Left leg: Residual soft tissue swelling of the distal  calf and proximal Achilles, although improved from previous visits.  He does have movement with Janee Morn squeeze test today.  No significant tenderness to palpation.  There is active plantar and dorsiflexion without pain.  Able to perform bilateral heel raise, although some weakness on this affected side.  Imaging: No results found.  Past Medical/Family/Surgical/Social History: Medications & Allergies reviewed per EMR, new medications updated. Patient Active Problem List   Diagnosis Date Noted   Preoperative cardiovascular examination    Abdominal pain 02/12/2018   IBS (irritable bowel syndrome) 02/12/2018   Cholelithiasis 02/12/2018   Elevated glucose 02/12/2018   Elevated serum creatinine 02/12/2018   CAD (coronary artery disease) 08/25/2017   NSTEMI (non-ST elevated myocardial infarction) (HCC)    Elevated troponin 04/30/2017   Leukocytosis 04/30/2017   Hypophosphatemia 04/30/2017   CKD (chronic kidney disease) stage 2, GFR 60-89 ml/min 04/30/2017   Hypercholesterolemia 04/30/2017   Anaphylaxis 04/29/2017   Chest pain    Syncope 02/15/2014   Bradycardia 02/15/2014   Past Medical History:  Diagnosis Date   Bradycardia 05/01/2017   CAD (coronary artery disease)    Coronary artery disease    IBS (irritable bowel syndrome)    Raynaud's disease    Syncope    Family History  Problem Relation Age of Onset   Hyperlipidemia Father    Diabetes Father    Heart disease Father    Diabetes Mother    Parkinson's disease Mother    Heart disease Mother    Past Surgical History:  Procedure Laterality Date   ANKLE FRACTURE SURGERY     ANTERIOR CRUCIATE LIGAMENT REPAIR  1990   CARDIAC CATHETERIZATION     CHOLECYSTECTOMY N/A 02/17/2018   Procedure: LAPAROSCOPIC CHOLECYSTECTOMY;  Surgeon: Emelia Loron, MD;  Location: [facility] OR;  Service: General;  Laterality: N/A;   CORONARY PRESSURE/FFR STUDY N/A 05/01/2017   Procedure: INTRAVASCULAR PRESSURE WIRE/FFR STUDY;  Surgeon: Yvonne Kendall, MD;  Location: [facility];  Service: Cardiovascular;  Laterality: N/A;   CORONARY STENT INTERVENTION N/A 05/01/2017   Procedure: CORONARY STENT INTERVENTION;  Surgeon: Yvonne Kendall, MD;  Location: [facility];  Service: Cardiovascular;  Laterality: N/A;   LEFT HEART CATH AND CORONARY ANGIOGRAPHY N/A 05/01/2017   Procedure: LEFT HEART CATH AND CORONARY ANGIOGRAPHY;  Surgeon: Yvonne Kendall, MD;  Location: [facility];  Service: Cardiovascular;  Laterality: N/A;   MASS EXCISION Right 02/07/2019   Procedure: RIGHT LONG FINGER EXCISION MASS;  Surgeon: Betha Loa, MD;  [facility];  Service: Orthopedics;  Laterality: Right;   Social History   Occupational History   Not on file  Tobacco Use   Smoking status: Former    Packs/day: 0.50    Years: 12.00    Additional pack years: 0.00    Total pack years: 6.00    Types: Cigarettes    Start date: 06/20/1966    Quit date: 08/19/1971    Years since quitting: 51.4   Smokeless tobacco: Never  Substance and Sexual Activity   Alcohol use: Yes    Alcohol/week: 4.0 standard drinks of alcohol    Types: 4 Cans of beer per week    Comment: social   Drug use: No   Sexual activity: Not on file  I spent 36 minutes in the care of the patient today including face-to-face time, preparation to see the patient, as well as gait analysis, dynamic testing, review and modification of home exercises, discussion on formalized physical therapy, treatment of extracorporeal shockwave therapy for the above diagnoses.   Madelyn Brunner, DO Primary Care Sports Medicine Physician  [facility]  This note was dictated using Dragon naturally speaking software and may contain errors in syntax, spelling, or content which have not been identified prior to signing this note.

## 2023-01-27 NOTE — Progress Notes (Signed)
States he is feeling much better.  

## 2023-02-02 ENCOUNTER — Telehealth: Payer: Self-pay | Admitting: Sports Medicine

## 2023-02-02 DIAGNOSIS — S86012D Strain of left Achilles tendon, subsequent encounter: Secondary | ICD-10-CM

## 2023-02-02 NOTE — Telephone Encounter (Signed)
Referral entered  

## 2023-02-02 NOTE — Telephone Encounter (Signed)
I called patient and advised, referral entered and someone will call him to schedule.

## 2023-02-02 NOTE — Telephone Encounter (Signed)
Patient would like to start PT no referral was placed please advise [name] wanted him to start per Last OV notes, would like to come to PT here if possible

## 2023-02-08 NOTE — Therapy (Unsigned)
OUTPATIENT PHYSICAL THERAPY LOWER EXTREMITY EVALUATION   Patient Name: Travis Jenkins MRN: [id number] DOB:[birth year], 72 y.o., male Today's Date: 02/09/2023  END OF SESSION:  PT End of Session - 02/09/23 1018     Visit Number 1    Date for PT Re-Evaluation 03/23/23    Authorization Type [employer]    Authorization Time Period 02/09/23 to 03/23/23    PT Start Time 0932    PT Stop Time 1016    PT Time Calculation (min) 44 min    Activity Tolerance Patient tolerated treatment well;No increased pain    Behavior During Therapy [facility] for tasks assessed/performed             Past Medical History:  Diagnosis Date   Bradycardia 05/01/2017   CAD (coronary artery disease)    Coronary artery disease    IBS (irritable bowel syndrome)    Raynaud's disease    Syncope    Past Surgical History:  Procedure Laterality Date   ANKLE FRACTURE SURGERY     ANTERIOR CRUCIATE LIGAMENT REPAIR  1990   CARDIAC CATHETERIZATION     CHOLECYSTECTOMY N/A 02/17/2018   Procedure: LAPAROSCOPIC CHOLECYSTECTOMY;  Surgeon: Emelia Loron, MD;  Location: [facility] OR;  Service: General;  Laterality: N/A;   CORONARY PRESSURE/FFR STUDY N/A 05/01/2017   Procedure: INTRAVASCULAR PRESSURE WIRE/FFR STUDY;  Surgeon: Yvonne Kendall, MD;  Location: [facility];  Service: Cardiovascular;  Laterality: N/A;   CORONARY STENT INTERVENTION N/A 05/01/2017   Procedure: CORONARY STENT INTERVENTION;  Surgeon: Yvonne Kendall, MD;  Location: [facility];  Service: Cardiovascular;  Laterality: N/A;   LEFT HEART CATH AND CORONARY ANGIOGRAPHY N/A 05/01/2017   Procedure: LEFT HEART CATH AND CORONARY ANGIOGRAPHY;  Surgeon: Yvonne Kendall, MD;  Location: [facility];  Service: Cardiovascular;  Laterality: N/A;   MASS EXCISION Right 02/07/2019   Procedure: RIGHT LONG FINGER EXCISION MASS;  Surgeon: Betha Loa, MD;  Location: [facility];  Service: Orthopedics;  Laterality: Right;   Patient  Active Problem List   Diagnosis Date Noted   Preoperative cardiovascular examination    Abdominal pain 02/12/2018   IBS (irritable bowel syndrome) 02/12/2018   Cholelithiasis 02/12/2018   Elevated glucose 02/12/2018   Elevated serum creatinine 02/12/2018   CAD (coronary artery disease) 08/25/2017   NSTEMI (non-ST elevated myocardial infarction) (HCC)    Elevated troponin 04/30/2017   Leukocytosis 04/30/2017   Hypophosphatemia 04/30/2017   CKD (chronic kidney disease) stage 2, GFR 60-89 ml/min 04/30/2017   Hypercholesterolemia 04/30/2017   Anaphylaxis 04/29/2017   Chest pain    Syncope 02/15/2014   Bradycardia 02/15/2014    PCP: Dibas Koirala, MD  REFERRING PROVIDER: Madelyn Brunner, DO  REFERRING DIAG:   S86.012D (ICD-10-CM) - Partial Achilles tendon tear, left, subsequent encounter    THERAPY DIAG:  Muscle weakness (generalized)  Stiffness of left ankle, not elsewhere classified  Pain in left lower leg  Other abnormalities of gait and mobility  Rationale for Evaluation and Treatment: Rehabilitation  ONSET DATE: ~2 months ago  SUBJECTIVE:   SUBJECTIVE STATEMENT: Pt states that he was playing pickle ball about 2 months ago and he injured his calf during a lunge forward to save the ball. He had immediate swelling and pain. He went to the ortho after hours clinic and they told him it was sprained and he could start PT the next week. He wanted a second opinion and saw Dr [name]. He was diagnosed with a partial achilles tear on the  Lt. He was not put in a boot but was encouraged to avoid much movement. At his most recent visit he was told he can start heel raises and gradually progress his ROM. He has swelling throughout the day but denies pain with his activity. Mostly he notes weakness and issues walking.   PERTINENT HISTORY: Ankle surgery (unsure which LE), ACL repair  PAIN:  Are you having pain? No  PRECAUTIONS: Other: Per referring MD okay with him continuing  plantarflexion strengthening exercises as well as including dorsiflexion exercises now with advancement per PT discretion.  I did add bilateral calf raises for him to start in the meantime before starting PT  WEIGHT BEARING RESTRICTIONS: No  FALLS:  Has patient fallen in last 6 months? No  LIVING ENVIRONMENT: Lives with: lives with their family Lives in: House/apartment Stairs:avoiding stairs at the moment, yard is sloped  Has following equipment at home: None  OCCUPATION: retired  PLOF: Independent  PATIENT GOALS: get back to walking walking 1 hr a day, sloped neighborhood and swing a golf club   NEXT MD VISIT: 3 weeks   OBJECTIVE:   DIAGNOSTIC FINDINGS:  Limited musculoskeletal ultrasound of the left lower extremity, left  Achilles and posterior leg was performed today.  Evaluation of the distal  Achilles both in short and long axis shows proper insertion over the  calcaneus without tendon irregularity.  Has rescan to the mid and proximal  aspect of the Achilles there is a high-grade tear near the myotendinous  junction with significant hypoechoic fluid in hyperemia.  There is no  significant tendon retraction, although this is near full-thickness.   Evaluation proximally of the gastroc and soleus shows no abnormality in  the mid belly.  Plantaris was visualized without abnormality on limited  view.   PATIENT SURVEYS:  FOTO 59, goal 56  COGNITION: Overall cognitive status: Within functional limits for tasks assessed     SENSATION: Denied numbess or tingling  EDEMA:  Circumferential: 8cm proximal from lateral maleolus Rt: 23cm, Lt: 28.5cm   MUSCLE LENGTH: Hamstrings: Right  deg; Left  deg [name] test: Right  deg; Left  deg  POSTURE: weight shift right  PALPATION: Muscle spasm Lt gatroc   LOWER EXTREMITY ROM:  Active ROM Right eval Left eval  Hip flexion    Hip extension    Hip abduction    Hip adduction    Hip internal rotation    Hip external  rotation    Knee flexion    Knee extension    Ankle dorsiflexion  Knee flex: 12 Knee ext: 10  Ankle plantarflexion  30  Ankle inversion  15  Ankle eversion  5   (Blank rows = not tested)  LOWER EXTREMITY MMT:  MMT Right eval Left eval  Hip flexion  5  Hip extension 5 5  Hip abduction 4+ 4+  Hip adduction    Hip internal rotation    Hip external rotation 4 4  Knee flexion  5  Knee extension    Ankle dorsiflexion  4  Ankle plantarflexion  Not tested, unable to complete BLE heel raises through full ROM on Lt  Ankle inversion  4  Ankle eversion  4   (Blank rows = not tested)  LOWER EXTREMITY SPECIAL TESTS:    FUNCTIONAL TESTS:   SLS: unable on each, increased ankle rocking noted on Lt  GAIT: Distance walked: 28ft Assistive device utilized: None Level of assistance:  Comments: decreased push off on Lt, minimal trunk rotation,  no pain noted    TODAY'S TREATMENT:                                                                                                                              DATE: 02/09/23 Review of HEP see below for more detail Discussed use of retrograde massage and compression to decrease swelling in the lower leg    PATIENT EDUCATION:  Education details: eval findings/POC; implemented HEP; swelling management Person educated: Patient Education method: Explanation and Handouts Education comprehension: verbalized understanding and returned demonstration  HOME EXERCISE PROGRAM: Access Code: [email or web address] URL: [email or web address] Date: 02/09/2023 Prepared by: [facility] - Outpatient Rehab - [facility]  Exercises - Heel Raises with Counter Support with Brace On  - 2 x daily - 7 x weekly - 1 sets - 10 reps - Great Toe Flexion with Resistance  - 2 x daily - 7 x weekly - 10 reps - Long Sitting Ankle Dorsiflexion with Anchored Resistance  - 2 x daily - 7 x weekly - 10 reps  ASSESSMENT:  CLINICAL IMPRESSION: Patient is a  72 y.o. M who was seen today for physical therapy evaluation and treatment for partial proximal Lt achilles tear onset about 8 weeks ago while playing pickleball. Pt has been managed conservatively and was referred to PT to now begin progressing LE strength and ROM. He is wearing a heel lift. Pt has Lt LE swelling, muscle spasm and weakness in the Lt lower leg which is also evident with his altered gait mechanics. Pt denies much pain with his regular activity, but has been limiting activity per MD recommendations. He would benefit from skilled PT to address his limitations in ROM, strength, proprioception and facilitate his return to daily and recreational activity without limitation.   OBJECTIVE IMPAIRMENTS: decreased activity tolerance, decreased endurance, decreased knowledge of condition, decreased mobility, difficulty walking, decreased ROM, decreased strength, hypomobility, increased edema, increased muscle spasms, impaired flexibility, improper body mechanics, and pain.   ACTIVITY LIMITATIONS: squatting, stairs, locomotion level, and recreation  PARTICIPATION LIMITATIONS: interpersonal relationship, community activity, and yard work  PERSONAL FACTORS: Age, Time since onset of injury/illness/exacerbation, and 1 comorbidity: previous LE surgeries  are also affecting patient's functional outcome.   REHAB POTENTIAL: Good  CLINICAL DECISION MAKING: Evolving/moderate complexity  EVALUATION COMPLEXITY: Moderate   GOALS: Goals reviewed with patient? Yes  SHORT TERM GOALS: Target date: 02/16/23 Pt will be independent with his initial HEP to safely progress LE strength and ROM. Baseline: Goal status: INITIAL  2.  Pt will have improvement in gastroc strength and mechanics, evident by his ability to complete 10 Bil heel raises through full ankle ROM and without weight shift to the Rt.  Baseline:  Goal status: INITIAL   LONG TERM GOALS: Target date: 03/23/23  Pt will have 20 deg of Lt ankle DF   and atleast 45 deg of Lt ankle PF to improve mechanics with daily activity.  Baseline:  Goal status: INITIAL  2.  Pt will have improved ankle proprioception evident by his ability to maintain SLS up to 5 sec on the Lt, 2/3 trials.  Baseline:  Goal status: INITIAL  3.  Pt's FOTO score will improve to 78 from the start of PT. Baseline:  Goal status: INITIAL  4.  Pt will be able to complete atleast 20 single leg heel raises on the Lt without increase in pain. Baseline:  Goal status: INITIAL  5.  Pt will be able to return to walking up to 1 hour a day without increase in pain. Baseline:  Goal status: INITIAL  6.  Pt will report being able to return to 75% effort golf swing without difficulty, once cleared by MD. Baseline:  Goal status: INITIAL   PLAN:  PT FREQUENCY: 2x/week  PT DURATION: 6 weeks  PLANNED INTERVENTIONS: Therapeutic exercises, Therapeutic activity, Neuromuscular re-education, Balance training, Gait training, Patient/Family education, Self Care, Joint mobilization, Stair training, Aquatic Therapy, Dry Needling, Cryotherapy, Taping, Vasopneumatic device, Manual therapy, and Re-evaluation  PLAN FOR NEXT SESSION: per MD pt may begin to progress ankle ROM and strength, ankle proprioception, f/u on compression garment for swelling,  10:52 AM,02/09/23 Donita Brooks PT, DPT [facility]  [phone]

## 2023-02-09 ENCOUNTER — Ambulatory Visit: Payer: PPO | Admitting: Physical Therapy

## 2023-02-09 ENCOUNTER — Encounter: Payer: Self-pay | Admitting: Physical Therapy

## 2023-02-09 DIAGNOSIS — M6281 Muscle weakness (generalized): Secondary | ICD-10-CM | POA: Diagnosis not present

## 2023-02-09 DIAGNOSIS — M25672 Stiffness of left ankle, not elsewhere classified: Secondary | ICD-10-CM

## 2023-02-09 DIAGNOSIS — M79662 Pain in left lower leg: Secondary | ICD-10-CM | POA: Diagnosis not present

## 2023-02-09 DIAGNOSIS — R2689 Other abnormalities of gait and mobility: Secondary | ICD-10-CM | POA: Diagnosis not present

## 2023-02-13 ENCOUNTER — Encounter: Payer: Self-pay | Admitting: Physical Therapy

## 2023-02-13 ENCOUNTER — Ambulatory Visit (INDEPENDENT_AMBULATORY_CARE_PROVIDER_SITE_OTHER): Payer: PPO | Admitting: Physical Therapy

## 2023-02-13 DIAGNOSIS — M25672 Stiffness of left ankle, not elsewhere classified: Secondary | ICD-10-CM

## 2023-02-13 DIAGNOSIS — M6281 Muscle weakness (generalized): Secondary | ICD-10-CM | POA: Diagnosis not present

## 2023-02-13 DIAGNOSIS — R2689 Other abnormalities of gait and mobility: Secondary | ICD-10-CM | POA: Diagnosis not present

## 2023-02-13 DIAGNOSIS — M79662 Pain in left lower leg: Secondary | ICD-10-CM | POA: Diagnosis not present

## 2023-02-13 NOTE — Therapy (Signed)
OUTPATIENT PHYSICAL THERAPY TREATMENT   Patient Name: Travis Jenkins MRN: [id number] DOB:Nov 01, 1950, 72 y.o., male Today's Date: 02/13/2023  END OF SESSION:  PT End of Session - 02/13/23 1004     Visit Number 2    Date for PT Re-Evaluation 03/23/23    Authorization Type [employer]    Authorization Time Period 02/09/23 to 03/23/23    PT Start Time 1005    PT Stop Time 1047    PT Time Calculation (min) 42 min    Activity Tolerance Patient tolerated treatment well;No increased pain    Behavior During Therapy [facility] for tasks assessed/performed              Past Medical History:  Diagnosis Date   Bradycardia 05/01/2017   CAD (coronary artery disease)    Coronary artery disease    IBS (irritable bowel syndrome)    Raynaud's disease    Syncope    Past Surgical History:  Procedure Laterality Date   ANKLE FRACTURE SURGERY     ANTERIOR CRUCIATE LIGAMENT REPAIR  1990   CARDIAC CATHETERIZATION     CHOLECYSTECTOMY N/A 02/17/2018   Procedure: LAPAROSCOPIC CHOLECYSTECTOMY;  Surgeon: Emelia Loron, MD;  Location: [facility] OR;  Service: General;  Laterality: N/A;   CORONARY PRESSURE/FFR STUDY N/A 05/01/2017   Procedure: INTRAVASCULAR PRESSURE WIRE/FFR STUDY;  Surgeon: Yvonne Kendall, MD;  [facility];  Service: Cardiovascular;  Laterality: N/A;   CORONARY STENT INTERVENTION N/A 05/01/2017   Procedure: CORONARY STENT INTERVENTION;  Surgeon: Yvonne Kendall, MD;  Location: [facility];  Service: Cardiovascular;  Laterality: N/A;   LEFT HEART CATH AND CORONARY ANGIOGRAPHY N/A 05/01/2017   Procedure: LEFT HEART CATH AND CORONARY ANGIOGRAPHY;  Surgeon: Yvonne Kendall, MD;  Location: [facility];  Service: Cardiovascular;  Laterality: N/A;   MASS EXCISION Right 02/07/2019   Procedure: RIGHT LONG FINGER EXCISION MASS;  Surgeon: Betha Loa, MD;  Location: [facility];  Service: Orthopedics;  Laterality: Right;   Patient Active Problem  List   Diagnosis Date Noted   Preoperative cardiovascular examination    Abdominal pain 02/12/2018   IBS (irritable bowel syndrome) 02/12/2018   Cholelithiasis 02/12/2018   Elevated glucose 02/12/2018   Elevated serum creatinine 02/12/2018   CAD (coronary artery disease) 08/25/2017   NSTEMI (non-ST elevated myocardial infarction) (HCC)    Elevated troponin 04/30/2017   Leukocytosis 04/30/2017   Hypophosphatemia 04/30/2017   CKD (chronic kidney disease) stage 2, GFR 60-89 ml/min 04/30/2017   Hypercholesterolemia 04/30/2017   Anaphylaxis 04/29/2017   Chest pain    Syncope 02/15/2014   Bradycardia 02/15/2014    PCP: Dibas Koirala, MD  REFERRING PROVIDER: Madelyn Brunner, DO  REFERRING DIAG:   S86.012D (ICD-10-CM) - Partial Achilles tendon tear, left, subsequent encounter    THERAPY DIAG:  Stiffness of left ankle, not elsewhere classified  Muscle weakness (generalized)  Pain in left lower leg  Other abnormalities of gait and mobility  Rationale for Evaluation and Treatment: Rehabilitation  ONSET DATE: ~2 months ago  SUBJECTIVE:   SUBJECTIVE STATEMENT: Pt wearing compression sleeve and has helped with swelling.  PERTINENT HISTORY: Ankle surgery (unsure which LE), ACL repair  PAIN:  Are you having pain? No  PRECAUTIONS: Other: Per referring MD okay with him continuing plantarflexion strengthening exercises as well as including dorsiflexion exercises now with advancement per PT discretion.  I did add bilateral calf raises for him to start in the meantime before starting PT  WEIGHT BEARING RESTRICTIONS: No  FALLS:  Has patient fallen in last 6 months? No  LIVING ENVIRONMENT: Lives with: lives with their family Lives in: House/apartment Stairs:avoiding stairs at the moment, yard is sloped  Has following equipment at home: None  OCCUPATION: retired  PLOF: Independent  PATIENT GOALS: get back to walking walking 1 hr a day, sloped neighborhood and swing a golf  club   NEXT MD VISIT: 3 weeks   OBJECTIVE:   DIAGNOSTIC FINDINGS:  Limited musculoskeletal ultrasound of the left lower extremity, left  Achilles and posterior leg was performed today.  Evaluation of the distal  Achilles both in short and long axis shows proper insertion over the  calcaneus without tendon irregularity.  Has rescan to the mid and proximal  aspect of the Achilles there is a high-grade tear near the myotendinous  junction with significant hypoechoic fluid in hyperemia.  There is no  significant tendon retraction, although this is near full-thickness.   Evaluation proximally of the gastroc and soleus shows no abnormality in  the mid belly.  Plantaris was visualized without abnormality on limited  view.   PATIENT SURVEYS:  EVAL: FOTO 59, goal 60  COGNITION: Overall cognitive status: Within functional limits for tasks assessed     SENSATION: Denied numbess or tingling  EDEMA:  EVAL: Circumferential: 8cm proximal from lateral maleolus Rt: 23cm, Lt: 28.5cm   MUSCLE LENGTH: Hamstrings: Right  deg; Left  deg [name] test: Right  deg; Left  deg  POSTURE: weight shift right  PALPATION: Muscle spasm Lt gatroc   LOWER EXTREMITY ROM:  Active ROM Left eval  Ankle dorsiflexion Knee flex: 12 Knee ext: 10  Ankle plantarflexion 30  Ankle inversion 15  Ankle eversion 5   (Blank rows = not tested)  LOWER EXTREMITY MMT:  MMT Right eval Left eval  Hip flexion  5  Hip extension 5 5  Hip abduction 4+ 4+  Hip adduction    Hip internal rotation    Hip external rotation 4 4  Knee flexion  5  Knee extension    Ankle dorsiflexion  4  Ankle plantarflexion  Not tested, unable to complete BLE heel raises through full ROM on Lt  Ankle inversion  4  Ankle eversion  4   (Blank rows = not tested)  FUNCTIONAL TESTS:  EVAL: SLS: unable on each, increased ankle rocking noted on Lt  GAIT: EVAL: Distance walked: 46ft Assistive device utilized: NoneLevel of  assistance:  Comments: decreased push off on Lt, minimal trunk rotation, no pain noted    TODAY'S TREATMENT:                                                                                                                              DATE:  02/13/23 TherEx Review of HEP - min cues for technique Slantboard stretch 3x30 sec SLS on foam bil 5x15 sec; intermittent UE support needed Sidestepping on foam in // bars x 5 laps Tandem stand 5x15 sec  Calf raises with L3 band around both legs 2x10 Seated arch lifts 2x10; 5 sec hold   02/09/23 Review of HEP see below for more detail Discussed use of retrograde massage and compression to decrease swelling in the lower leg    PATIENT EDUCATION:  Education details: eval findings/POC; implemented HEP; swelling management Person educated: Patient Education method: Explanation and Handouts Education comprehension: verbalized understanding and returned demonstration  HOME EXERCISE PROGRAM: Access Code: [email or web address] URL: [email or web address] Date: 02/09/2023 Prepared by: [facility] - [facility]patient Rehab - [facility]  Exercises - Heel Raises with Counter Support with Brace On  - 2 x daily - 7 x weekly - 1 sets - 10 reps - Great Toe Flexion with Resistance  - 2 x daily - 7 x weekly - 10 reps - Long Sitting Ankle Dorsiflexion with Anchored Resistance  - 2 x daily - 7 x weekly - 10 reps  ASSESSMENT:  CLINICAL IMPRESSION: Pt tolerated session well today with focus mainly on closed chain ankle strengthening and proprioception.  Needs min cues for HEP but otherwise demonstrated good understanding.  Will continue to benefit from PT to maximize function.  OBJECTIVE IMPAIRMENTS: decreased activity tolerance, decreased endurance, decreased knowledge of condition, decreased mobility, difficulty walking, decreased ROM, decreased strength, hypomobility, increased edema, increased muscle spasms, impaired flexibility, improper body  mechanics, and pain.   ACTIVITY LIMITATIONS: squatting, stairs, locomotion level, and recreation  PARTICIPATION LIMITATIONS: interpersonal relationship, community activity, and yard work  PERSONAL FACTORS: Age, Time since onset of injury/illness/exacerbation, and 1 comorbidity: previous LE surgeries  are also affecting patient's functional outcome.   REHAB POTENTIAL: Good  CLINICAL DECISION MAKING: Evolving/moderate complexity  EVALUATION COMPLEXITY: Moderate   GOALS: Goals reviewed with patient? Yes  SHORT TERM GOALS: Target date: 02/16/23 Pt will be independent with his initial HEP to safely progress LE strength and ROM. Baseline: Goal status: INITIAL  2.  Pt will have improvement in gastroc strength and mechanics, evident by his ability to complete 10 Bil heel raises through full ankle ROM and without weight shift to the Rt.  Baseline:  Goal status: INITIAL   LONG TERM GOALS: Target date: 03/23/23  Pt will have 20 deg of Lt ankle DF  and atleast 45 deg of Lt ankle PF to improve mechanics with daily activity.  Baseline:  Goal status: INITIAL  2.  Pt will have improved ankle proprioception evident by his ability to maintain SLS up to 5 sec on the Lt, 2/3 trials.  Baseline:  Goal status: INITIAL  3.  Pt's FOTO score will improve to 78 from the start of PT. Baseline:  Goal status: INITIAL  4.  Pt will be able to complete atleast 20 single leg heel raises on the Lt without increase in pain. Baseline:  Goal status: INITIAL  5.  Pt will be able to return to walking up to 1 hour a day without increase in pain. Baseline:  Goal status: INITIAL  6.  Pt will report being able to return to 75% effort golf swing without difficulty, once cleared by MD. Baseline:  Goal status: INITIAL   PLAN:  PT FREQUENCY: 2x/week  PT DURATION: 6 weeks  PLANNED INTERVENTIONS: Therapeutic exercises, Therapeutic activity, Neuromuscular re-education, Balance training, Gait training,  Patient/Family education, Self Care, Joint mobilization, Stair training, Aquatic Therapy, Dry Needling, Cryotherapy, Taping, Vasopneumatic device, Manual therapy, and Re-evaluation  PLAN FOR NEXT SESSION: continue closed chain, SL activities as tolerated,  per MD pt may begin to progress ankle ROM and strength,  ankle proprioception, f/u on compression garment for swelling,  Clarita Crane, PT, DPT 02/13/23 10:47 AM

## 2023-02-16 ENCOUNTER — Ambulatory Visit (INDEPENDENT_AMBULATORY_CARE_PROVIDER_SITE_OTHER): Payer: PPO | Admitting: Rehabilitative and Restorative Service Providers"

## 2023-02-16 ENCOUNTER — Encounter: Payer: Self-pay | Admitting: Rehabilitative and Restorative Service Providers"

## 2023-02-16 DIAGNOSIS — M79662 Pain in left lower leg: Secondary | ICD-10-CM

## 2023-02-16 DIAGNOSIS — R2689 Other abnormalities of gait and mobility: Secondary | ICD-10-CM | POA: Diagnosis not present

## 2023-02-16 DIAGNOSIS — M6281 Muscle weakness (generalized): Secondary | ICD-10-CM | POA: Diagnosis not present

## 2023-02-16 DIAGNOSIS — M25672 Stiffness of left ankle, not elsewhere classified: Secondary | ICD-10-CM

## 2023-02-16 NOTE — Therapy (Signed)
OUTPATIENT PHYSICAL THERAPY TREATMENT   Patient Name: Travis Jenkins MRN: [id number] DOB:12-Aug-1951, 72 y.o., male Today's Date: 02/16/2023  END OF SESSION:  PT End of Session - 02/16/23 1341     Visit Number 3    Number of Visits 12    Date for PT Re-Evaluation 03/23/23    Authorization Type [employer]    Authorization Time Period 02/09/23 to 03/23/23    Progress Note Due on Visit 10    PT Start Time 1344    PT Stop Time 1423    PT Time Calculation (min) 39 min    Activity Tolerance Patient tolerated treatment well    Behavior During Therapy Travis Jenkins for tasks assessed/performed               Past Medical History:  Diagnosis Date   Bradycardia 05/01/2017   CAD (coronary artery disease)    Coronary artery disease    IBS (irritable bowel syndrome)    Raynaud's disease    Syncope    Past Surgical History:  Procedure Laterality Date   ANKLE FRACTURE SURGERY     ANTERIOR CRUCIATE LIGAMENT REPAIR  1990   CARDIAC CATHETERIZATION     CHOLECYSTECTOMY N/A 02/17/2018   Procedure: LAPAROSCOPIC CHOLECYSTECTOMY;  Surgeon: Travis Loron, MD;  Location: [facility];  Service: General;  Laterality: N/A;   CORONARY PRESSURE/FFR STUDY N/A 05/01/2017   Procedure: INTRAVASCULAR PRESSURE WIRE/FFR STUDY;  Surgeon: Travis Kendall, MD;  Location: [facility];  Service: Cardiovascular;  Laterality: N/A;   CORONARY STENT INTERVENTION N/A 05/01/2017   Procedure: CORONARY STENT INTERVENTION;  Surgeon: Travis Kendall, MD;  Location: [facility];  Service: Cardiovascular;  Laterality: N/A;   LEFT HEART CATH AND CORONARY ANGIOGRAPHY N/A 05/01/2017   Procedure: LEFT HEART CATH AND CORONARY ANGIOGRAPHY;  Surgeon: Travis Kendall, MD;  Location: [facility];  Service: Cardiovascular;  Laterality: N/A;   MASS EXCISION Right 02/07/2019   Procedure: RIGHT LONG FINGER EXCISION MASS;  Surgeon: Travis Loa, MD;  Location: [facility];  Service: Orthopedics;   Laterality: Right;   Patient Active Problem List   Diagnosis Date Noted   Preoperative cardiovascular examination    Abdominal pain 02/12/2018   IBS (irritable bowel syndrome) 02/12/2018   Cholelithiasis 02/12/2018   Elevated glucose 02/12/2018   Elevated serum creatinine 02/12/2018   CAD (coronary artery disease) 08/25/2017   NSTEMI (non-ST elevated myocardial infarction) (HCC)    Elevated troponin 04/30/2017   Leukocytosis 04/30/2017   Hypophosphatemia 04/30/2017   CKD (chronic kidney disease) stage 2, GFR 60-89 ml/min 04/30/2017   Hypercholesterolemia 04/30/2017   Anaphylaxis 04/29/2017   Chest pain    Syncope 02/15/2014   Bradycardia 02/15/2014    PCP: Travis Koirala, MD  REFERRING PROVIDER: Madelyn Brunner, DO  REFERRING DIAG:   S86.012D (ICD-10-CM) - Partial Achilles tendon tear, left, subsequent encounter    THERAPY DIAG:  Stiffness of left ankle, not elsewhere classified  Muscle weakness (generalized)  Pain in left lower leg  Other abnormalities of gait and mobility  Rationale for Evaluation and Treatment: Rehabilitation  ONSET DATE: ~2 months ago  SUBJECTIVE:   SUBJECTIVE STATEMENT: Pt wearing compression sleeve and has helped with swelling.  PERTINENT HISTORY: Ankle surgery (unsure which LE), ACL repair  PAIN:  Are you having pain? No  PRECAUTIONS: Other: Per referring MD okay with him continuing plantarflexion strengthening exercises as well as including dorsiflexion exercises now with advancement per PT discretion.  I did add bilateral calf raises  for him to start in the meantime before starting PT  WEIGHT BEARING RESTRICTIONS: No  FALLS:  Has patient fallen in last 6 months? No  LIVING ENVIRONMENT: Lives with: lives with their family Lives in: House/apartment Stairs:avoiding stairs at the moment, yard is sloped  Has following equipment at home: None  OCCUPATION: retired  PLOF: Independent  PATIENT GOALS: get back to walking walking 1 hr  a day, sloped neighborhood and swing a golf club   OBJECTIVE:   DIAGNOSTIC FINDINGS:  Limited musculoskeletal ultrasound of the left lower extremity, left  Achilles and posterior leg was performed today.  Evaluation of the distal  Achilles both in short and long axis shows proper insertion over the  calcaneus without tendon irregularity.  Has rescan to the mid and proximal  aspect of the Achilles there is a high-grade tear near the myotendinous  junction with significant hypoechoic fluid in hyperemia.  There is no  significant tendon retraction, although this is near full-thickness.   Evaluation proximally of the gastroc and soleus shows no abnormality in  the mid belly.  Plantaris was visualized without abnormality on limited  view.   PATIENT SURVEYS:  EVAL: FOTO 59, goal 54  COGNITION: Overall cognitive status: Within functional limits for tasks assessed     SENSATION: Denied numbess or tingling  EDEMA:  EVAL: Circumferential: 8cm proximal from lateral maleolus Rt: 23cm, Lt: 28.5cm   MUSCLE LENGTH: Hamstrings: Right  deg; Left  deg Travis Jenkins test: Right  deg; Left  deg  POSTURE: weight shift right  PALPATION: Muscle spasm Lt gatroc   LOWER EXTREMITY ROM:  Active ROM Left eval  Ankle dorsiflexion Knee flex: 12 Knee ext: 10  Ankle plantarflexion 30  Ankle inversion 15  Ankle eversion 5   (Blank rows = not tested)  LOWER EXTREMITY MMT:  MMT Right eval Left eval  Hip flexion  5  Hip extension 5 5  Hip abduction 4+ 4+  Hip adduction    Hip internal rotation    Hip external rotation 4 4  Knee flexion  5  Knee extension    Ankle dorsiflexion  4  Ankle plantarflexion  Not tested, unable to complete BLE heel raises through full ROM on Lt  Ankle inversion  4  Ankle eversion  4   (Blank rows = not tested)  FUNCTIONAL TESTS:  EVAL: SLS: unable on each, increased ankle rocking noted on Lt  GAIT: EVAL: Distance walked: 44ft Assistive device utilized:  NoneLevel of assistance:  Comments: decreased push off on Lt, minimal trunk rotation, no pain noted                     TODAY'S TREATMENT:                                                                         DATE: 02/16/2023 TherEx UBE LE only 5 mins lvl 4.0 PF double leg up, shift to Lt WB as much as tolerated 2 x 10  Step on over and down 6 inch step WB on Lt leg no UE assist x10 Lateral step down WB on Lt leg 6 inch step x 15 slow lowering focus  Gastroc incline board stretch 30 sec x 6  Neuro [employer] touching double leg x 30 Tandem stance on foam in // bars occasional HHA 1 min x 1 bilateral Tandem ambulation fwd on foam in // bars occasional HHA 6 ft x 8   TODAY'S TREATMENT:                                                                         DATE: 02/13/23 TherEx Review of HEP - min cues for technique Slantboard stretch 3x30 sec SLS on foam bil 5x15 sec; intermittent UE support needed Sidestepping on foam in // bars x 5 laps Tandem stand 5x15 sec Calf raises with L3 band around both legs 2x10 Seated arch lifts 2x10; 5 sec hold   TODAY'S TREATMENT:                                                                         DATE: 02/09/23 Review of HEP see below for more detail Discussed use of retrograde massage and compression to decrease swelling in the lower leg  PATIENT EDUCATION:  Education details: eval findings/POC; implemented HEP; swelling management Person educated: Patient Education method: Explanation and Handouts Education comprehension: verbalized understanding and returned demonstration  HOME EXERCISE PROGRAM: Access Code: [email or web address] URL: [email or web address] Date: 02/09/2023 Prepared by: [facility] - [facility]patient Rehab - [facility]  Exercises - Heel Raises with Counter Support with Brace On  - 2 x daily - 7 x weekly - 1 sets - 10 reps - Great Toe Flexion with Resistance  - 2 x daily - 7 x weekly - 10  reps - Long Sitting Ankle Dorsiflexion with Anchored Resistance  - 2 x daily - 7 x weekly - 10 reps  ASSESSMENT:  CLINICAL IMPRESSION: Fair to good control in new intervention for balance control.  Continued strengthening and neuro muscular control improvements to help progress towards long term goals.   OBJECTIVE IMPAIRMENTS: decreased activity tolerance, decreased endurance, decreased knowledge of condition, decreased mobility, difficulty walking, decreased ROM, decreased strength, hypomobility, increased edema, increased muscle spasms, impaired flexibility, improper body mechanics, and pain.   ACTIVITY LIMITATIONS: squatting, stairs, locomotion level, and recreation  PARTICIPATION LIMITATIONS: interpersonal relationship, community activity, and yard work  PERSONAL FACTORS: Age, Time since onset of injury/illness/exacerbation, and 1 comorbidity: previous LE surgeries  are also affecting patient's functional outcome.   REHAB POTENTIAL: Good  CLINICAL DECISION MAKING: Evolving/moderate complexity  EVALUATION COMPLEXITY: Moderate   GOALS: Goals reviewed with patient? Yes  SHORT TERM GOALS: Target date: 02/16/23 Pt will be independent with his initial HEP to safely progress LE strength and ROM. Baseline: Goal status: on going 02/16/2023  2.  Pt will have improvement in gastroc strength and mechanics, evident by his ability to complete 10 Bil heel raises through full ankle ROM and without weight shift to the Rt.  Baseline:  Goal status:on going 02/16/2023   LONG TERM GOALS: Target date: 03/23/23  Pt will have 20 deg of Lt ankle DF  and  atleast 45 deg of Lt ankle PF to improve mechanics with daily activity.  Baseline:  Goal status: INITIAL  2.  Pt will have improved ankle proprioception evident by his ability to maintain SLS up to 5 sec on the Lt, 2/3 trials.  Baseline:  Goal status: INITIAL  3.  Pt's FOTO score will improve to 78 from the start of PT. Baseline:  Goal status:  INITIAL  4.  Pt will be able to complete atleast 20 single leg heel raises on the Lt without increase in pain. Baseline:  Goal status: INITIAL  5.  Pt will be able to return to walking up to 1 hour a day without increase in pain. Baseline:  Goal status: INITIAL  6.  Pt will report being able to return to 75% effort golf swing without difficulty, once cleared by MD. Baseline:  Goal status: INITIAL   PLAN:  PT FREQUENCY: 2x/week  PT DURATION: 6 weeks  PLANNED INTERVENTIONS: Therapeutic exercises, Therapeutic activity, Neuromuscular re-education, Balance training, Gait training, Patient/Family education, Self Care, Joint mobilization, Stair training, Aquatic Therapy, Dry Needling, Cryotherapy, Taping, Vasopneumatic device, Manual therapy, and Re-evaluation  PLAN FOR NEXT SESSION: Compliant surface balance, progressive strengthening/loading Lt leg.    Chyrel Masson, PT, DPT, OCS, ATC 02/16/23  2:24 PM

## 2023-02-18 ENCOUNTER — Ambulatory Visit (INDEPENDENT_AMBULATORY_CARE_PROVIDER_SITE_OTHER): Payer: PPO | Admitting: Physical Therapy

## 2023-02-18 ENCOUNTER — Encounter: Payer: Self-pay | Admitting: Physical Therapy

## 2023-02-18 DIAGNOSIS — M25672 Stiffness of left ankle, not elsewhere classified: Secondary | ICD-10-CM

## 2023-02-18 DIAGNOSIS — R2689 Other abnormalities of gait and mobility: Secondary | ICD-10-CM

## 2023-02-18 DIAGNOSIS — M6281 Muscle weakness (generalized): Secondary | ICD-10-CM | POA: Diagnosis not present

## 2023-02-18 DIAGNOSIS — M79662 Pain in left lower leg: Secondary | ICD-10-CM

## 2023-02-18 NOTE — Therapy (Signed)
OUTPATIENT PHYSICAL THERAPY TREATMENT   Patient Name: Travis Jenkins MRN: [id number] DOB:1950/10/08, 72 y.o., male Today's Date: 02/18/2023  END OF SESSION:  PT End of Session - 02/18/23 1017     Visit Number 4    Number of Visits 12    Date for PT Re-Evaluation 03/23/23    Authorization Type [employer]    Authorization Time Period 02/09/23 to 03/23/23    Progress Note Due on Visit 10    PT Start Time 1015    PT Stop Time 1055    PT Time Calculation (min) 40 min    Activity Tolerance Patient tolerated treatment well    Behavior During Therapy [facility] for tasks assessed/performed                Past Medical History:  Diagnosis Date   Bradycardia 05/01/2017   CAD (coronary artery disease)    Coronary artery disease    IBS (irritable bowel syndrome)    Raynaud's disease    Syncope    Past Surgical History:  Procedure Laterality Date   ANKLE FRACTURE SURGERY     ANTERIOR CRUCIATE LIGAMENT REPAIR  1990   CARDIAC CATHETERIZATION     CHOLECYSTECTOMY N/A 02/17/2018   Procedure: LAPAROSCOPIC CHOLECYSTECTOMY;  Surgeon: Emelia Loron, MD;  Location: [facility] OR;  Service: General;  Laterality: N/A;   CORONARY PRESSURE/FFR STUDY N/A 05/01/2017   Procedure: INTRAVASCULAR PRESSURE WIRE/FFR STUDY;  Surgeon: Yvonne Kendall, MD;  Location: [facility];  Service: Cardiovascular;  Laterality: N/A;   CORONARY STENT INTERVENTION N/A 05/01/2017   Procedure: CORONARY STENT INTERVENTION;  Surgeon: Yvonne Kendall, MD;  Location: [facility];  Service: Cardiovascular;  Laterality: N/A;   LEFT HEART CATH AND CORONARY ANGIOGRAPHY N/A 05/01/2017   Procedure: LEFT HEART CATH AND CORONARY ANGIOGRAPHY;  Surgeon: Yvonne Kendall, MD;  Location: [facility];  Service: Cardiovascular;  Laterality: N/A;   MASS EXCISION Right 02/07/2019   Procedure: RIGHT LONG FINGER EXCISION MASS;  Surgeon: Betha Loa, MD;  Location: [facility];  Service: Orthopedics;   Laterality: Right;   Patient Active Problem List   Diagnosis Date Noted   Preoperative cardiovascular examination    Abdominal pain 02/12/2018   IBS (irritable bowel syndrome) 02/12/2018   Cholelithiasis 02/12/2018   Elevated glucose 02/12/2018   Elevated serum creatinine 02/12/2018   CAD (coronary artery disease) 08/25/2017   NSTEMI (non-ST elevated myocardial infarction) (HCC)    Elevated troponin 04/30/2017   Leukocytosis 04/30/2017   Hypophosphatemia 04/30/2017   CKD (chronic kidney disease) stage 2, GFR 60-89 ml/min 04/30/2017   Hypercholesterolemia 04/30/2017   Anaphylaxis 04/29/2017   Chest pain    Syncope 02/15/2014   Bradycardia 02/15/2014    PCP: Dibas Koirala, MD  REFERRING PROVIDER: Madelyn Brunner, DO  REFERRING DIAG:   S86.012D (ICD-10-CM) - Partial Achilles tendon tear, left, subsequent encounter    THERAPY DIAG:  Stiffness of left ankle, not elsewhere classified  Muscle weakness (generalized)  Pain in left lower leg  Other abnormalities of gait and mobility  Rationale for Evaluation and Treatment: Rehabilitation  ONSET DATE: ~2 months ago  SUBJECTIVE:   SUBJECTIVE STATEMENT: Doing more every day; still having some balance issues  PERTINENT HISTORY: Ankle surgery (unsure which LE), ACL repair   PAIN:  Are you having pain? No  PRECAUTIONS: Other: Per referring MD okay with him continuing plantarflexion strengthening exercises as well as including dorsiflexion exercises now with advancement per PT discretion.  I did add bilateral  calf raises for him to start in the meantime before starting PT  WEIGHT BEARING RESTRICTIONS: No  FALLS:  Has patient fallen in last 6 months? No  LIVING ENVIRONMENT: Lives with: lives with their family Lives in: House/apartment Stairs:avoiding stairs at the moment, yard is sloped  Has following equipment at home: None  OCCUPATION: retired  PLOF: Independent  PATIENT GOALS: get back to walking walking 1 hr a  day, sloped neighborhood and swing a golf club   OBJECTIVE:   DIAGNOSTIC FINDINGS:  Limited musculoskeletal ultrasound of the left lower extremity, left  Achilles and posterior leg was performed today.  Evaluation of the distal  Achilles both in short and long axis shows proper insertion over the  calcaneus without tendon irregularity.  Has rescan to the mid and proximal  aspect of the Achilles there is a high-grade tear near the myotendinous  junction with significant hypoechoic fluid in hyperemia.  There is no  significant tendon retraction, although this is near full-thickness.   Evaluation proximally of the gastroc and soleus shows no abnormality in  the mid belly.  Plantaris was visualized without abnormality on limited  view.   PATIENT SURVEYS:  EVAL: FOTO 59, goal 55  COGNITION: Overall cognitive status: Within functional limits for tasks assessed     SENSATION: Denied numbess or tingling  EDEMA:  EVAL: Circumferential: 8cm proximal from lateral maleolus Rt: 23cm, Lt: 28.5cm   MUSCLE LENGTH: Hamstrings: Right  deg; Left  deg [name] test: Right  deg; Left  deg  POSTURE: weight shift right  PALPATION: Muscle spasm Lt gatroc   LOWER EXTREMITY ROM:  Active ROM Left eval  Ankle dorsiflexion Knee flex: 12 Knee ext: 10  Ankle plantarflexion 30  Ankle inversion 15  Ankle eversion 5   (Blank rows = not tested)  LOWER EXTREMITY MMT:  MMT Right eval Left eval  Hip flexion  5  Hip extension 5 5  Hip abduction 4+ 4+  Hip adduction    Hip internal rotation    Hip external rotation 4 4  Knee flexion  5  Knee extension    Ankle dorsiflexion  4  Ankle plantarflexion  Not tested, unable to complete BLE heel raises through full ROM on Lt  Ankle inversion  4  Ankle eversion  4   (Blank rows = not tested)  FUNCTIONAL TESTS:  EVAL: SLS: unable on each, increased ankle rocking noted on Lt  GAIT: EVAL: Distance walked: 73ft Assistive device utilized:  NoneLevel of assistance:  Comments: decreased push off on Lt, minimal trunk rotation, no pain noted                     TODAY'S TREATMENT: DATE:  02/18/23 TherEx NuStep L6 x 6 min Calf raises 3x10 off incline board; cues for Lt weight shift Calf stretch on incline board 3x30 sec Seated calf raise with 10# DB 3x10  Neuro Re-Ed SLS on Lt without UE support x 10 reps 3-8 sec  Tandem walking fwd on foam in // bars occasional HHA 6 ft x 5 laps Toe walking on foam beam in // bars x 5 laps; intermittent UE support   02/16/2023 TherEx UBE LE only 5 mins lvl 4.0 PF double leg up, shift to Lt WB as much as tolerated 2 x 10  Step on over and down 6 inch step WB on Lt leg no UE assist x10 Lateral step down WB on Lt leg 6 inch step x 15 slow lowering focus  Gastroc incline board stretch 30 sec x 6  Neuro [employer] light touching double leg x 30 Tandem stance on foam in // bars occasional HHA 1 min x 1 bilateral Tandem ambulation fwd on foam in // bars occasional HHA 6 ft x 8   02/13/23 TherEx Review of HEP - min cues for technique Slantboard stretch 3x30 sec SLS on foam bil 5x15 sec; intermittent UE support needed Sidestepping on foam in // bars x 5 laps Tandem stand 5x15 sec Calf raises with L3 band around both legs 2x10 Seated arch lifts 2x10; 5 sec hold   02/09/23 Review of HEP see below for more detail Discussed use of retrograde massage and compression to decrease swelling in the lower leg  PATIENT EDUCATION:  Education details: eval findings/POC; implemented HEP; swelling management Person educated: Patient Education method: Explanation and Handouts Education comprehension: verbalized understanding and returned demonstration  HOME EXERCISE PROGRAM: Access Code: [email or web address] URL: [email or web address] Date: 02/09/2023 Prepared by: [facility] - Outpatient Rehab - [facility]  Exercises - Heel Raises with Counter Support with Brace  On  - 2 x daily - 7 x weekly - 1 sets - 10 reps - Great Toe Flexion with Resistance  - 2 x daily - 7 x weekly - 10 reps - Long Sitting Ankle Dorsiflexion with Anchored Resistance  - 2 x daily - 7 x weekly - 10 reps  ASSESSMENT:  CLINICAL IMPRESSION: Continues to have difficulty with balance tasks especially SLS. Overall progressing well with PT and will continue to benefit from PT to maximize function.  OBJECTIVE IMPAIRMENTS: decreased activity tolerance, decreased endurance, decreased knowledge of condition, decreased mobility, difficulty walking, decreased ROM, decreased strength, hypomobility, increased edema, increased muscle spasms, impaired flexibility, improper body mechanics, and pain.   ACTIVITY LIMITATIONS: squatting, stairs, locomotion level, and recreation  PARTICIPATION LIMITATIONS: interpersonal relationship, community activity, and yard work  PERSONAL FACTORS: Age, Time since onset of injury/illness/exacerbation, and 1 comorbidity: previous LE surgeries  are also affecting patient's functional outcome.   REHAB POTENTIAL: Good  CLINICAL DECISION MAKING: Evolving/moderate complexity  EVALUATION COMPLEXITY: Moderate   GOALS: Goals reviewed with patient? Yes  SHORT TERM GOALS: Target date: 02/16/23 Pt will be independent with his initial HEP to safely progress LE strength and ROM. Baseline: Goal status: on going 02/16/2023  2.  Pt will have improvement in gastroc strength and mechanics, evident by his ability to complete 10 Bil heel raises through full ankle ROM and without weight shift to the Rt.  Baseline:  Goal status:on going 02/16/2023   LONG TERM GOALS: Target date: 03/23/23  Pt will have 20 deg of Lt ankle DF  and atleast 45 deg of Lt ankle PF to improve mechanics with daily activity.  Baseline:  Goal status: INITIAL  2.  Pt will have improved ankle proprioception evident by his ability to maintain SLS up to 5 sec on the Lt, 2/3 trials.  Baseline:  Goal status:  INITIAL  3.  Pt's FOTO score will improve to 78 from the start of PT. Baseline:  Goal status: INITIAL  4.  Pt will be able to complete atleast 20 single leg heel raises on the Lt without increase in pain. Baseline:  Goal status: INITIAL  5.  Pt will be able to return to walking up to 1 hour a day without increase in pain. Baseline:  Goal status: INITIAL  6.  Pt will report being able to return to 75% effort golf swing without difficulty, once cleared  by MD. Baseline:  Goal status: INITIAL   PLAN:  PT FREQUENCY: 2x/week  PT DURATION: 6 weeks  PLANNED INTERVENTIONS: Therapeutic exercises, Therapeutic activity, Neuromuscular re-education, Balance training, Gait training, Patient/Family education, Self Care, Joint mobilization, Stair training, Aquatic Therapy, Dry Needling, Cryotherapy, Taping, Vasopneumatic device, Manual therapy, and Re-evaluation  PLAN FOR NEXT SESSION: continue with compliant surface balance, progressive strengthening/loading Lt leg.    Clarita Crane, PT, DPT 02/18/23 10:59 AM

## 2023-02-23 ENCOUNTER — Ambulatory Visit: Payer: PPO | Admitting: Physical Therapy

## 2023-02-23 ENCOUNTER — Encounter: Payer: Self-pay | Admitting: Physical Therapy

## 2023-02-23 DIAGNOSIS — M6281 Muscle weakness (generalized): Secondary | ICD-10-CM | POA: Diagnosis not present

## 2023-02-23 DIAGNOSIS — M25672 Stiffness of left ankle, not elsewhere classified: Secondary | ICD-10-CM | POA: Diagnosis not present

## 2023-02-23 DIAGNOSIS — R2689 Other abnormalities of gait and mobility: Secondary | ICD-10-CM | POA: Diagnosis not present

## 2023-02-23 DIAGNOSIS — M79662 Pain in left lower leg: Secondary | ICD-10-CM

## 2023-02-23 NOTE — Therapy (Signed)
OUTPATIENT PHYSICAL THERAPY TREATMENT   Patient Name: Travis Jenkins MRN: [id number] DOB:07/23/1951, 72 y.o., male Today's Date: 02/23/2023  END OF SESSION:  PT End of Session - 02/23/23 1014     Visit Number 5    Number of Visits 12    Date for PT Re-Evaluation 03/23/23    Authorization Type [employer]    Authorization Time Period 02/09/23 to 03/23/23    Progress Note Due on Visit 10    PT Start Time 1012    PT Stop Time 1051    PT Time Calculation (min) 39 min    Activity Tolerance Patient tolerated treatment well    Behavior During Therapy [facility] for tasks assessed/performed                 Past Medical History:  Diagnosis Date   Bradycardia 05/01/2017   CAD (coronary artery disease)    Coronary artery disease    IBS (irritable bowel syndrome)    Raynaud's disease    Syncope    Past Surgical History:  Procedure Laterality Date   ANKLE FRACTURE SURGERY     ANTERIOR CRUCIATE LIGAMENT REPAIR  1990   CARDIAC CATHETERIZATION     CHOLECYSTECTOMY N/A 02/17/2018   Procedure: LAPAROSCOPIC CHOLECYSTECTOMY;  Surgeon: Emelia Loron, MD;  [facility] OR;  Service: General;  Laterality: N/A;   CORONARY PRESSURE/FFR STUDY N/A 05/01/2017   Procedure: INTRAVASCULAR PRESSURE WIRE/FFR STUDY;  Surgeon: Yvonne Kendall, MD;  Location: [facility];  Service: Cardiovascular;  Laterality: N/A;   CORONARY STENT INTERVENTION N/A 05/01/2017   Procedure: CORONARY STENT INTERVENTION;  Surgeon: Yvonne Kendall, MD;  Location: [facility];  Service: Cardiovascular;  Laterality: N/A;   LEFT HEART CATH AND CORONARY ANGIOGRAPHY N/A 05/01/2017   Procedure: LEFT HEART CATH AND CORONARY ANGIOGRAPHY;  Surgeon: Yvonne Kendall, MD;  Location: [facility];  Service: Cardiovascular;  Laterality: N/A;   MASS EXCISION Right 02/07/2019   Procedure: RIGHT LONG FINGER EXCISION MASS;  Surgeon: Betha Loa, MD;  Location: [facility];  Service: Orthopedics;   Laterality: Right;   Patient Active Problem List   Diagnosis Date Noted   Preoperative cardiovascular examination    Abdominal pain 02/12/2018   IBS (irritable bowel syndrome) 02/12/2018   Cholelithiasis 02/12/2018   Elevated glucose 02/12/2018   Elevated serum creatinine 02/12/2018   CAD (coronary artery disease) 08/25/2017   NSTEMI (non-ST elevated myocardial infarction) (HCC)    Elevated troponin 04/30/2017   Leukocytosis 04/30/2017   Hypophosphatemia 04/30/2017   CKD (chronic kidney disease) stage 2, GFR 60-89 ml/min 04/30/2017   Hypercholesterolemia 04/30/2017   Anaphylaxis 04/29/2017   Chest pain    Syncope 02/15/2014   Bradycardia 02/15/2014    PCP: Dibas Koirala, MD  REFERRING PROVIDER: Madelyn Brunner, DO  REFERRING DIAG:   S86.012D (ICD-10-CM) - Partial Achilles tendon tear, left, subsequent encounter    THERAPY DIAG:  Stiffness of left ankle, not elsewhere classified  Muscle weakness (generalized)  Pain in left lower leg  Other abnormalities of gait and mobility  Rationale for Evaluation and Treatment: Rehabilitation  ONSET DATE: ~2 months ago  SUBJECTIVE:   SUBJECTIVE STATEMENT: Feeling better; getting stronger every day  PERTINENT HISTORY: Ankle surgery (unsure which LE), ACL repair   PAIN:  Are you having pain? No  PRECAUTIONS: Other: Per referring MD okay with him continuing plantarflexion strengthening exercises as well as including dorsiflexion exercises now with advancement per PT discretion.  I did add bilateral calf raises  for him to start in the meantime before starting PT  WEIGHT BEARING RESTRICTIONS: No  FALLS:  Has patient fallen in last 6 months? No  LIVING ENVIRONMENT: Lives with: lives with their family Lives in: House/apartment Stairs:avoiding stairs at the moment, yard is sloped  Has following equipment at home: None  OCCUPATION: retired  PLOF: Independent  PATIENT GOALS: get back to walking walking 1 hr a day, sloped  neighborhood and swing a golf club   OBJECTIVE:   DIAGNOSTIC FINDINGS:  Limited musculoskeletal ultrasound of the left lower extremity, left  Achilles and posterior leg was performed today.  Evaluation of the distal  Achilles both in short and long axis shows proper insertion over the  calcaneus without tendon irregularity.  Has rescan to the mid and proximal  aspect of the Achilles there is a high-grade tear near the myotendinous  junction with significant hypoechoic fluid in hyperemia.  There is no  significant tendon retraction, although this is near full-thickness.   Evaluation proximally of the gastroc and soleus shows no abnormality in  the mid belly.  Plantaris was visualized without abnormality on limited  view.   PATIENT SURVEYS:  EVAL: FOTO 59, goal 40  COGNITION: Overall cognitive status: Within functional limits for tasks assessed     SENSATION: Denied numbess or tingling  EDEMA:  EVAL: Circumferential: 8cm proximal from lateral maleolus Rt: 23cm, Lt: 28.5cm   MUSCLE LENGTH: Hamstrings: Right  deg; Left  deg [name] test: Right  deg; Left  deg  POSTURE: weight shift right  PALPATION: Muscle spasm Lt gatroc   LOWER EXTREMITY ROM:  Active ROM Left eval Left 02/23/23  Ankle dorsiflexion Knee flex: 12 Knee ext: 10 Supine knee ext: 10 Standing knee flex: 23  Ankle plantarflexion 30 46  Ankle inversion 15 14  Ankle eversion 5 10   (Blank rows = not tested)  LOWER EXTREMITY MMT:  MMT Right eval Left eval  Hip flexion  5  Hip extension 5 5  Hip abduction 4+ 4+  Hip adduction    Hip internal rotation    Hip external rotation 4 4  Knee flexion  5  Knee extension    Ankle dorsiflexion  4  Ankle plantarflexion  Not tested, unable to complete BLE heel raises through full ROM on Lt  Ankle inversion  4  Ankle eversion  4   (Blank rows = not tested)  FUNCTIONAL TESTS:  EVAL: SLS: unable on each, increased ankle rocking noted on Lt 02/23/23: Lt without  UE support x 3 trials:  5.72 sec; 10.12 sec, 10.41 sec  GAIT: EVAL: Distance walked: 33ft Assistive device utilized: NoneLevel of assistance:  Comments: decreased push off on Lt, minimal trunk rotation, no pain noted                     TODAY'S TREATMENT: DATE:  02/23/23 TherEx NuStep L6 x 6 min Calf stretch on incline board 3x30 sec Calf raises 3x10 off incline board; cues for Lt weight shift (focus on Lt only for eccentric) ROM measurements - see above for details Calf raises with L3 band on tension around ankles 3x10  Neuro Re-Ed SLS on Lt without UE support x 3 trials:  5.72 sec; 10.12 sec, 10.41 sec Braiding in // bars x 5 laps Tandem walking fwd/bwd on foam in // bars occasional HHA 6 ft x 5 laps  02/18/23 TherEx NuStep L6 x 6 min Calf raises 3x10 off incline board; cues for Lt weight shift  Calf stretch on incline board 3x30 sec Seated calf raise with 10# DB 3x10  Neuro Re-Ed SLS on Lt without UE support x 10 reps 3-8 sec  Tandem walking fwd on foam in // bars occasional HHA 6 ft x 5 laps Toe walking on foam beam in // bars x 5 laps; intermittent UE support   02/16/2023 TherEx UBE LE only 5 mins lvl 4.0 PF double leg up, shift to Lt WB as much as tolerated 2 x 10  Step on over and down 6 inch step WB on Lt leg no UE assist x10 Lateral step down WB on Lt leg 6 inch step x 15 slow lowering focus  Gastroc incline board stretch 30 sec x 6  Neuro [employer] light touching double leg x 30 Tandem stance on foam in // bars occasional HHA 1 min x 1 bilateral Tandem ambulation fwd on foam in // bars occasional HHA 6 ft x 8   02/13/23 TherEx Review of HEP - min cues for technique Slantboard stretch 3x30 sec SLS on foam bil 5x15 sec; intermittent UE support needed Sidestepping on foam in // bars x 5 laps Tandem stand 5x15 sec Calf raises with L3 band around both legs 2x10 Seated arch lifts 2x10; 5 sec hold   PATIENT EDUCATION:  Education details: eval  findings/POC; implemented HEP; swelling management Person educated: Patient Education method: Explanation and Handouts Education comprehension: verbalized understanding and returned demonstration  HOME EXERCISE PROGRAM: Access Code: [email or web address] URL: [email or web address] Date: 02/09/2023 Prepared by: [facility] - [facility]  Exercises - Heel Raises with Counter Support with Brace On  - 2 x daily - 7 x weekly - 1 sets - 10 reps - Great Toe Flexion with Resistance  - 2 x daily - 7 x weekly - 10 reps - Long Sitting Ankle Dorsiflexion with Anchored Resistance  - 2 x daily - 7 x weekly - 10 reps  ASSESSMENT:  CLINICAL IMPRESSION: Pt tolerated session well with improvements in AROM and SLS/proprioception noted today.  Progressing well with PT, and may be ready to transition to HEP or decrease frequency over the next 1-2 weeks.  OBJECTIVE IMPAIRMENTS: decreased activity tolerance, decreased endurance, decreased knowledge of condition, decreased mobility, difficulty walking, decreased ROM, decreased strength, hypomobility, increased edema, increased muscle spasms, impaired flexibility, improper body mechanics, and pain.   ACTIVITY LIMITATIONS: squatting, stairs, locomotion level, and recreation  PARTICIPATION LIMITATIONS: interpersonal relationship, community activity, and yard work  PERSONAL FACTORS: Age, Time since onset of injury/illness/exacerbation, and 1 comorbidity: previous LE surgeries  are also affecting patient's functional outcome.   REHAB POTENTIAL: Good  CLINICAL DECISION MAKING: Evolving/moderate complexity  EVALUATION COMPLEXITY: Moderate   GOALS: Goals reviewed with patient? Yes  SHORT TERM GOALS: Target date: 02/16/23 Pt will be independent with his initial HEP to safely progress LE strength and ROM. Baseline: Goal status: on going 02/16/2023  2.  Pt will have improvement in gastroc strength and mechanics, evident by his  ability to complete 10 Bil heel raises through full ankle ROM and without weight shift to the Rt.  Baseline:  Goal status:on going 02/16/2023   LONG TERM GOALS: Target date: 03/23/23  Pt will have 20 deg of Lt ankle DF  and atleast 45 deg of Lt ankle PF to improve mechanics with daily activity.  Baseline:  Goal status: INITIAL  2.  Pt will have improved ankle proprioception evident by his ability to maintain SLS up to 5 sec on  the Lt, 2/3 trials.  Baseline:  Goal status: MET 02/23/23  3.  Pt's FOTO score will improve to 78 from the start of PT. Baseline:  Goal status: INITIAL  4.  Pt will be able to complete atleast 20 single leg heel raises on the Lt without increase in pain. Baseline:  Goal status: INITIAL  5.  Pt will be able to return to walking up to 1 hour a day without increase in pain. Baseline:  Goal status: INITIAL  6.  Pt will report being able to return to 75% effort golf swing without difficulty, once cleared by MD. Baseline:  Goal status: INITIAL   PLAN:  PT FREQUENCY: 2x/week  PT DURATION: 6 weeks  PLANNED INTERVENTIONS: Therapeutic exercises, Therapeutic activity, Neuromuscular re-education, Balance training, Gait training, Patient/Family education, Self Care, Joint mobilization, Stair training, Aquatic Therapy, Dry Needling, Cryotherapy, Taping, Vasopneumatic device, Manual therapy, and Re-evaluation  PLAN FOR NEXT SESSION: continue with compliant surface balance, progressive strengthening/loading Lt leg; trial ankle 4-way with black theraband   Clarita Crane, PT, DPT 02/23/23 10:59 AM

## 2023-02-24 ENCOUNTER — Ambulatory Visit: Payer: PPO | Admitting: Sports Medicine

## 2023-02-24 ENCOUNTER — Other Ambulatory Visit: Payer: Self-pay

## 2023-02-24 ENCOUNTER — Encounter: Payer: Self-pay | Admitting: Sports Medicine

## 2023-02-24 DIAGNOSIS — S86012D Strain of left Achilles tendon, subsequent encounter: Secondary | ICD-10-CM

## 2023-02-24 NOTE — Progress Notes (Signed)
Travis Jenkins - 72 y.o. male MRN [id number]  Date of birth: [birth year]  Office Visit Note: Visit Date: 02/24/2023 PCP: Darrow Bussing, MD Referred by: Darrow Bussing, MD  Subjective: Chief Complaint  Patient presents with   Left Leg - Follow-up   HPI: Travis Jenkins is a pleasant 73 y.o. male who presents today for left proximal achilles tear.  Travis Jenkins is about 2.5 months out from his initial injury which occurred playing pickle ball. He did suffer a high-grade near full-thickness tear of the proximal Achilles at the myotendinous junction.  We gave you a few sessions of extracorporeal shockwave therapy and he has been undergoing formal PT and home therapy.  Has minimal to no pain at this point, has been active with cycling, walking and home therapy.  Has not yet gotten back into golf or pickleball.  No issues with plantarflexion or dorsiflexion but does have some weakness with calf raise on the left side compared to his right.  Not taking any medication for this.  Pertinent ROS were reviewed with the patient and found to be negative unless otherwise specified above in HPI.   Assessment & Plan: Visit Diagnoses:  1. Partial Achilles tendon tear, left, subsequent encounter    Plan: Travis Jenkins has both ultrasound findings and clinical symptoms of a healing proximal Achilles tear at the myotendinous junction.  He is tolerating normal activity and formalized physical therapy.  At this point, he will start getting back into sporting activity.  Over the next 2 weeks he will start to increase his golf swing up to a full swing with hopes of fully returning at the 69-month mark from his injury (about 2 more weeks).  He is okay to get back into walking, running and bicycle without restriction at this point from my standpoint.  Discussed return to tennis and other more high impact activity, will let pain be his guide in his discretion with slightly higher risk of reinjury.  We did remove his heel lifts today to  get him back into a normal shoe.  Will continue PT and home therapy.  I will see him back only as needed.  Follow-up: Return if symptoms worsen or fail to improve.   Meds & Orders: No orders of the defined types were placed in this encounter.   Orders Placed This Encounter  Procedures   [place] Extrem Low Left Comp     Procedures: No procedures performed      Clinical History: No specialty comments available.  He reports that he quit smoking about 51 years ago. His smoking use included cigarettes. He started smoking about 56 years ago. He has a 6.00 pack-year smoking history. He has never used smokeless tobacco. No results for input(s): "HGBA1C", "LABURIC" in the last 8760 hours.  Objective:    Physical Exam  Gen: Well-appearing, in no acute distress; non-toxic CV:  Well-perfused. Warm.  Resp: Breathing unlabored on room air; no wheezing. Psych: Fluid speech in conversation; appropriate affect; normal thought process Neuro: Sensation intact throughout. No gross coordination deficits.   Ortho Exam - LLE: Residual soft tissue swelling of the distal calf and proximal Achilles.  There is an intact Thompson squeeze test today.  He is able to perform bilateral heel raise, some single-leg heel raise but certainly weaker than the contralateral side.  This does not reproduce pain.  Imaging: [place] Extrem Low Left Comp  Result Date: 02/24/2023 Limited musculoskeletal ultrasound of the left lower extremity, left Achilles and calf was performed today.  Evaluation of the calcaneus shows no cortical defect, there is proper insertion of the distal Achilles without evidence of tearing or tendinopathic change.  The distal and mid Achilles is seen without abnormality in both short and long axis.  Scanning proximally through the Achilles near the myotendinous junction with extension into the gastrocnemius there is evidence of mild hypoechoic change, indicative of healing prior tear. There is no significant  hyperemia and there is good, community between the tendon and muscular junction.  Scanning the proximal gastrocnemius muscle there is no evidence of tearing here.    02/24/23^   12/26/22^  Past Medical/Family/Surgical/Social History: Medications & Allergies reviewed per EMR, new medications updated. Patient Active Problem List   Diagnosis Date Noted   Preoperative cardiovascular examination    Abdominal pain 02/12/2018   IBS (irritable bowel syndrome) 02/12/2018   Cholelithiasis 02/12/2018   Elevated glucose 02/12/2018   Elevated serum creatinine 02/12/2018   CAD (coronary artery disease) 08/25/2017   NSTEMI (non-ST elevated myocardial infarction) (HCC)    Elevated troponin 04/30/2017   Leukocytosis 04/30/2017   Hypophosphatemia 04/30/2017   CKD (chronic kidney disease) stage 2, GFR 60-89 ml/min 04/30/2017   Hypercholesterolemia 04/30/2017   Anaphylaxis 04/29/2017   Chest pain    Syncope 02/15/2014   Bradycardia 02/15/2014   Past Medical History:  Diagnosis Date   Bradycardia 05/01/2017   CAD (coronary artery disease)    Coronary artery disease    IBS (irritable bowel syndrome)    Raynaud's disease    Syncope    Family History  Problem Relation Age of Onset   Hyperlipidemia Father    Diabetes Father    Heart disease Father    Diabetes Mother    Parkinson's disease Mother    Heart disease Mother    Past Surgical History:  Procedure Laterality Date   ANKLE FRACTURE SURGERY     ANTERIOR CRUCIATE LIGAMENT REPAIR  1990   CARDIAC CATHETERIZATION     CHOLECYSTECTOMY N/A 02/17/2018   Procedure: LAPAROSCOPIC CHOLECYSTECTOMY;  Surgeon: Emelia Loron, MD;  Location: [facility] OR;  Service: General;  Laterality: N/A;   CORONARY PRESSURE/FFR STUDY N/A 05/01/2017   Procedure: INTRAVASCULAR PRESSURE WIRE/FFR STUDY;  Surgeon: Yvonne Kendall, MD;  Location: [facility];  Service: Cardiovascular;  Laterality: N/A;   CORONARY STENT INTERVENTION N/A 05/01/2017   Procedure:  CORONARY STENT INTERVENTION;  Surgeon: Yvonne Kendall, MD;  Location: [facility];  Service: Cardiovascular;  Laterality: N/A;   LEFT HEART CATH AND CORONARY ANGIOGRAPHY N/A 05/01/2017   Procedure: LEFT HEART CATH AND CORONARY ANGIOGRAPHY;  Surgeon: Yvonne Kendall, MD;  Location: [facility];  Service: Cardiovascular;  Laterality: N/A;   MASS EXCISION Right 02/07/2019   Procedure: RIGHT LONG FINGER EXCISION MASS;  Surgeon: Betha Loa, MD;  Location: [facility];  Service: Orthopedics;  Laterality: Right;   Social History   Occupational History   Not on file  Tobacco Use   Smoking status: Former    Packs/day: 0.50    Years: 12.00    Additional pack years: 0.00    Total pack years: 6.00    Types: Cigarettes    Start date: 06/20/1966    Quit date: 08/19/1971    Years since quitting: 51.5   Smokeless tobacco: Never  Substance and Sexual Activity   Alcohol use: Yes    Alcohol/week: 4.0 standard drinks of alcohol    Types: 4 Cans of beer per week    Comment: social   Drug use:  No   Sexual activity: Not on file

## 2023-02-24 NOTE — Progress Notes (Signed)
States he is doing better Motion is getting better and able to do more now He is still working with therapy and improving daily

## 2023-02-25 ENCOUNTER — Ambulatory Visit: Payer: PPO | Admitting: Rehabilitative and Restorative Service Providers"

## 2023-02-25 ENCOUNTER — Encounter: Payer: Self-pay | Admitting: Physical Therapy

## 2023-02-25 DIAGNOSIS — M25672 Stiffness of left ankle, not elsewhere classified: Secondary | ICD-10-CM

## 2023-02-25 DIAGNOSIS — R2689 Other abnormalities of gait and mobility: Secondary | ICD-10-CM

## 2023-02-25 DIAGNOSIS — M6281 Muscle weakness (generalized): Secondary | ICD-10-CM

## 2023-02-25 DIAGNOSIS — M79662 Pain in left lower leg: Secondary | ICD-10-CM

## 2023-02-25 NOTE — Therapy (Signed)
OUTPATIENT PHYSICAL THERAPY TREATMENT   Patient Name: Travis Jenkins MRN: [id number] DOB:1951/03/12, 72 y.o., male Today's Date: 02/25/2023  END OF SESSION:  PT End of Session - 02/25/23 1344     Visit Number 6    Number of Visits 12    Date for PT Re-Evaluation 03/23/23    Authorization Type [employer]    Authorization Time Period 02/09/23 to 03/23/23    Progress Note Due on Visit 10    PT Start Time 1343    PT Stop Time 1421    PT Time Calculation (min) 38 min    Activity Tolerance Patient tolerated treatment well    Behavior During Therapy [facility] for tasks assessed/performed                  Past Medical History:  Diagnosis Date   Bradycardia 05/01/2017   CAD (coronary artery disease)    Coronary artery disease    IBS (irritable bowel syndrome)    Raynaud's disease    Syncope    Past Surgical History:  Procedure Laterality Date   ANKLE FRACTURE SURGERY     ANTERIOR CRUCIATE LIGAMENT REPAIR  1990   CARDIAC CATHETERIZATION     CHOLECYSTECTOMY N/A 02/17/2018   Procedure: LAPAROSCOPIC CHOLECYSTECTOMY;  Surgeon: Emelia Loron, MD;  Location: [facility] OR;  Service: General;  Laterality: N/A;   CORONARY PRESSURE/FFR STUDY N/A 05/01/2017   Procedure: INTRAVASCULAR PRESSURE WIRE/FFR STUDY;  Surgeon: Yvonne Kendall, MD;  Location: [facility];  Service: Cardiovascular;  Laterality: N/A;   CORONARY STENT INTERVENTION N/A 05/01/2017   Procedure: CORONARY STENT INTERVENTION;  Surgeon: Yvonne Kendall, MD;  Location: [facility];  Service: Cardiovascular;  Laterality: N/A;   LEFT HEART CATH AND CORONARY ANGIOGRAPHY N/A 05/01/2017   Procedure: LEFT HEART CATH AND CORONARY ANGIOGRAPHY;  Surgeon: Yvonne Kendall, MD;  Location: [facility];  Service: Cardiovascular;  Laterality: N/A;   MASS EXCISION Right 02/07/2019   Procedure: RIGHT LONG FINGER EXCISION MASS;  Surgeon: Betha Loa, MD;  Location: [facility];  Service: Orthopedics;   Laterality: Right;   Patient Active Problem List   Diagnosis Date Noted   Preoperative cardiovascular examination    Abdominal pain 02/12/2018   IBS (irritable bowel syndrome) 02/12/2018   Cholelithiasis 02/12/2018   Elevated glucose 02/12/2018   Elevated serum creatinine 02/12/2018   CAD (coronary artery disease) 08/25/2017   NSTEMI (non-ST elevated myocardial infarction) (HCC)    Elevated troponin 04/30/2017   Leukocytosis 04/30/2017   Hypophosphatemia 04/30/2017   CKD (chronic kidney disease) stage 2, GFR 60-89 ml/min 04/30/2017   Hypercholesterolemia 04/30/2017   Anaphylaxis 04/29/2017   Chest pain    Syncope 02/15/2014   Bradycardia 02/15/2014    PCP: Dibas Koirala, MD  REFERRING PROVIDER: Madelyn Brunner, DO  REFERRING DIAG:   S86.012D (ICD-10-CM) - Partial Achilles tendon tear, left, subsequent encounter    THERAPY DIAG:  Stiffness of left ankle, not elsewhere classified  Muscle weakness (generalized)  Pain in left lower leg  Other abnormalities of gait and mobility  Rationale for Evaluation and Treatment: Rehabilitation  ONSET DATE: ~2 months ago  SUBJECTIVE:   SUBJECTIVE STATEMENT: Doctor visit went well and is cleared to work back into regular activity   PERTINENT HISTORY: Ankle surgery (unsure which LE), ACL repair   PAIN:  Are you having pain? No  PRECAUTIONS: Other: Per referring MD okay with him continuing plantarflexion strengthening exercises as well as including dorsiflexion exercises now with advancement per  PT discretion.  I did add bilateral calf raises for him to start in the meantime before starting PT  WEIGHT BEARING RESTRICTIONS: No  FALLS:  Has patient fallen in last 6 months? No  LIVING ENVIRONMENT: Lives with: lives with their family Lives in: House/apartment Stairs:avoiding stairs at the moment, yard is sloped  Has following equipment at home: None  OCCUPATION: retired  PLOF: Independent  PATIENT GOALS: get back to  walking walking 1 hr a day, sloped neighborhood and swing a golf club   OBJECTIVE:   DIAGNOSTIC FINDINGS:  Limited musculoskeletal ultrasound of the left lower extremity, left  Achilles and posterior leg was performed today.  Evaluation of the distal  Achilles both in short and long axis shows proper insertion over the  calcaneus without tendon irregularity.  Has rescan to the mid and proximal  aspect of the Achilles there is a high-grade tear near the myotendinous  junction with significant hypoechoic fluid in hyperemia.  There is no  significant tendon retraction, although this is near full-thickness.   Evaluation proximally of the gastroc and soleus shows no abnormality in  the mid belly.  Plantaris was visualized without abnormality on limited  view.   PATIENT SURVEYS:  EVAL: FOTO 59, goal 44  COGNITION: Overall cognitive status: Within functional limits for tasks assessed     SENSATION: Denied numbess or tingling  EDEMA:  EVAL: Circumferential: 8cm proximal from lateral maleolus Rt: 23cm, Lt: 28.5cm   MUSCLE LENGTH: Hamstrings: Right  deg; Left  deg [name] test: Right  deg; Left  deg  POSTURE: weight shift right  PALPATION: Muscle spasm Lt gatroc   LOWER EXTREMITY ROM:  Active ROM Left eval Left 02/23/23  Ankle dorsiflexion Knee flex: 12 Knee ext: 10 Supine knee ext: 10 Standing knee flex: 23  Ankle plantarflexion 30 46  Ankle inversion 15 14  Ankle eversion 5 10   (Blank rows = not tested)  LOWER EXTREMITY MMT:  MMT Right eval Left eval  Hip flexion  5  Hip extension 5 5  Hip abduction 4+ 4+  Hip adduction    Hip internal rotation    Hip external rotation 4 4  Knee flexion  5  Knee extension    Ankle dorsiflexion  4  Ankle plantarflexion  Not tested, unable to complete BLE heel raises through full ROM on Lt  Ankle inversion  4  Ankle eversion  4   (Blank rows = not tested)  FUNCTIONAL TESTS:  EVAL: SLS: unable on each, increased ankle  rocking noted on Lt 02/23/23: Lt without UE support x 3 trials:  5.72 sec; 10.12 sec, 10.41 sec  GAIT: EVAL: Distance walked: 73ft Assistive device utilized: None  Comments: decreased push off on Lt, minimal trunk rotation, no pain noted                     TODAY'S TREATMENT:                                                      DATE:02/25/2023 TherEx NuStep L6 x 6 min Lt calf raises 2x10 with RLE toe touch; light UE support Squat with heel raise 2x10; 10# KB Gastroc incline board stretch 30 sec x 3 Discussed current plan and transition to community fitness over the next few sessions  Neuro Re-ed Rockerboard  ant/post and lateral x 2 min each direction; light touch working on decreasing support Tandem stance on foam in // bars occasional HHA 30 sec x 3 bil SLS on foam x 5 trials; up to 5-7 sec on LLE  TODAY'S TREATMENT:                                                      DATE: 02/23/23 TherEx NuStep L6 x 6 min Calf stretch on incline board 3x30 sec Calf raises 3x10 off incline board; cues for Lt weight shift (focus on Lt only for eccentric) ROM measurements - see above for details Calf raises with L3 band on tension around ankles 3x10  Neuro Re-Ed SLS on Lt without UE support x 3 trials:  5.72 sec; 10.12 sec, 10.41 sec Braiding in // bars x 5 laps Tandem walking fwd/bwd on foam in // bars occasional HHA 6 ft x 5 laps  TODAY'S TREATMENT:                                                      DATE:02/18/23 TherEx NuStep L6 x 6 min Calf raises 3x10 off incline board; cues for Lt weight shift Calf stretch on incline board 3x30 sec Seated calf raise with 10# DB 3x10  Neuro Re-Ed SLS on Lt without UE support x 10 reps 3-8 sec  Tandem walking fwd on foam in // bars occasional HHA 6 ft x 5 laps Toe walking on foam beam in // bars x 5 laps; intermittent UE support   TODAY'S TREATMENT:                                                      DATE:02/16/2023 TherEx UBE LE only 5 mins lvl  4.0 PF double leg up, shift to Lt WB as much as tolerated 2 x 10  Step on over and down 6 inch step WB on Lt leg no UE assist x10 Lateral step down WB on Lt leg 6 inch step x 15 slow lowering focus  Gastroc incline board stretch 30 sec x 6  Neuro [employer] light touching double leg x 30 Tandem stance on foam in // bars occasional HHA 1 min x 1 bilateral Tandem ambulation fwd on foam in // bars occasional HHA 6 ft x 8   PATIENT EDUCATION:  Education details: eval findings/POC; implemented HEP; swelling management Person educated: Patient Education method: Explanation and Handouts Education comprehension: verbalized understanding and returned demonstration  HOME EXERCISE PROGRAM: Access Code: [email or web address] URL: [email or web address] Date: 02/09/2023 Prepared by: [facility] - [facility] Rehab - [facility]  Exercises - Heel Raises with Counter Support with Brace On  - 2 x daily - 7 x weekly - 1 sets - 10 reps - Great Toe Flexion with Resistance  - 2 x daily - 7 x weekly - 10 reps - Long Sitting Ankle Dorsiflexion with Anchored Resistance  - 2 x daily - 7 x weekly - 10 reps  ASSESSMENT:  CLINICAL IMPRESSION:  Pt overall doing well with PT at this time and DO has cleared him to return to full activity as pain and strength allows.  Feel we are nearing d/c from PT and anticipate transition to community fitness only in the next few session.  OBJECTIVE IMPAIRMENTS: decreased activity tolerance, decreased endurance, decreased knowledge of condition, decreased mobility, difficulty walking, decreased ROM, decreased strength, hypomobility, increased edema, increased muscle spasms, impaired flexibility, improper body mechanics, and pain.   ACTIVITY LIMITATIONS: squatting, stairs, locomotion level, and recreation  PARTICIPATION LIMITATIONS: interpersonal relationship, community activity, and yard work  PERSONAL FACTORS: Age, Time since onset of  injury/illness/exacerbation, and 1 comorbidity: previous LE surgeries  are also affecting patient's functional outcome.   REHAB POTENTIAL: Good  CLINICAL DECISION MAKING: Evolving/moderate complexity  EVALUATION COMPLEXITY: Moderate   GOALS: Goals reviewed with patient? Yes  SHORT TERM GOALS: Target date: 02/16/23 Pt will be independent with his initial HEP to safely progress LE strength and ROM. Baseline: Goal status: on going 02/16/2023  2.  Pt will have improvement in gastroc strength and mechanics, evident by his ability to complete 10 Bil heel raises through full ankle ROM and without weight shift to the Rt.  Baseline:  Goal status:on going 02/16/2023   LONG TERM GOALS: Target date: 03/23/23  Pt will have 20 deg of Lt ankle DF  and atleast 45 deg of Lt ankle PF to improve mechanics with daily activity.  Baseline:  Goal status: INITIAL  2.  Pt will have improved ankle proprioception evident by his ability to maintain SLS up to 5 sec on the Lt, 2/3 trials.  Baseline:  Goal status: MET 02/23/23  3.  Pt's FOTO score will improve to 78 from the start of PT. Baseline:  Goal status: INITIAL  4.  Pt will be able to complete atleast 20 single leg heel raises on the Lt without increase in pain. Baseline:  Goal status: INITIAL  5.  Pt will be able to return to walking up to 1 hour a day without increase in pain. Baseline:  Goal status: INITIAL  6.  Pt will report being able to return to 75% effort golf swing without difficulty, once cleared by MD. Baseline:  Goal status: INITIAL   PLAN:  PT FREQUENCY: 2x/week  PT DURATION: 6 weeks  PLANNED INTERVENTIONS: Therapeutic exercises, Therapeutic activity, Neuromuscular re-education, Balance training, Gait training, Patient/Family education, Self Care, Joint mobilization, Stair training, Aquatic Therapy, Dry Needling, Cryotherapy, Taping, Vasopneumatic device, Manual therapy, and Re-evaluation  PLAN FOR NEXT SESSION: begin d/c  planning, continue with compliant surface balance, progressive strengthening/loading Lt leg; trial ankle 4-way with black theraband   Clarita Crane, PT, DPT 02/25/23 2:22 PM

## 2023-03-02 ENCOUNTER — Ambulatory Visit: Payer: PPO | Admitting: Physical Therapy

## 2023-03-02 ENCOUNTER — Encounter: Payer: Self-pay | Admitting: Physical Therapy

## 2023-03-02 DIAGNOSIS — M25672 Stiffness of left ankle, not elsewhere classified: Secondary | ICD-10-CM | POA: Diagnosis not present

## 2023-03-02 DIAGNOSIS — M79662 Pain in left lower leg: Secondary | ICD-10-CM

## 2023-03-02 DIAGNOSIS — R2689 Other abnormalities of gait and mobility: Secondary | ICD-10-CM

## 2023-03-02 DIAGNOSIS — M6281 Muscle weakness (generalized): Secondary | ICD-10-CM

## 2023-03-02 NOTE — Therapy (Addendum)
OUTPATIENT PHYSICAL THERAPY TREATMENT  / DISCHARGE   Patient Name: Travis Jenkins MRN: [id number] DOB:06-Jan-1951, 72 y.o., male Today's Date: 03/02/2023  END OF SESSION:  PT End of Session - 03/02/23 1010     Visit Number 7    Number of Visits 12    Date for PT Re-Evaluation 03/23/23    Authorization Type [employer]    Authorization Time Period 02/09/23 to 03/23/23    Progress Note Due on Visit 10    PT Start Time 1013    PT Stop Time 1042    PT Time Calculation (min) 29 min    Activity Tolerance Patient tolerated treatment well    Behavior During Therapy [facility] for tasks assessed/performed                   Past Medical History:  Diagnosis Date   Bradycardia 05/01/2017   CAD (coronary artery disease)    Coronary artery disease    IBS (irritable bowel syndrome)    Raynaud's disease    Syncope    Past Surgical History:  Procedure Laterality Date   ANKLE FRACTURE SURGERY     ANTERIOR CRUCIATE LIGAMENT REPAIR  1990   CARDIAC CATHETERIZATION     CHOLECYSTECTOMY N/A 02/17/2018   Procedure: LAPAROSCOPIC CHOLECYSTECTOMY;  Surgeon: Emelia Loron, MD;  Location: [facility] OR;  Service: General;  Laterality: N/A;   CORONARY PRESSURE/FFR STUDY N/A 05/01/2017   Procedure: INTRAVASCULAR PRESSURE WIRE/FFR STUDY;  Surgeon: Yvonne Kendall, MD;  Location: [facility];  Service: Cardiovascular;  Laterality: N/A;   CORONARY STENT INTERVENTION N/A 05/01/2017   Procedure: CORONARY STENT INTERVENTION;  Surgeon: Yvonne Kendall, MD;  Location: [facility];  Service: Cardiovascular;  Laterality: N/A;   LEFT HEART CATH AND CORONARY ANGIOGRAPHY N/A 05/01/2017   Procedure: LEFT HEART CATH AND CORONARY ANGIOGRAPHY;  Surgeon: Yvonne Kendall, MD;  Location: [facility];  Service: Cardiovascular;  Laterality: N/A;   MASS EXCISION Right 02/07/2019   Procedure: RIGHT LONG FINGER EXCISION MASS;  Surgeon: Betha Loa, MD;  Location: [facility];  Service:  Orthopedics;  Laterality: Right;   Patient Active Problem List   Diagnosis Date Noted   Preoperative cardiovascular examination    Abdominal pain 02/12/2018   IBS (irritable bowel syndrome) 02/12/2018   Cholelithiasis 02/12/2018   Elevated glucose 02/12/2018   Elevated serum creatinine 02/12/2018   CAD (coronary artery disease) 08/25/2017   NSTEMI (non-ST elevated myocardial infarction) (HCC)    Elevated troponin 04/30/2017   Leukocytosis 04/30/2017   Hypophosphatemia 04/30/2017   CKD (chronic kidney disease) stage 2, GFR 60-89 ml/min 04/30/2017   Hypercholesterolemia 04/30/2017   Anaphylaxis 04/29/2017   Chest pain    Syncope 02/15/2014   Bradycardia 02/15/2014    PCP: Dibas Koirala, MD  REFERRING PROVIDER: Madelyn Brunner, DO  REFERRING DIAG:   S86.012D (ICD-10-CM) - Partial Achilles tendon tear, left, subsequent encounter    THERAPY DIAG:  Stiffness of left ankle, not elsewhere classified  Muscle weakness (generalized)  Pain in left lower leg  Other abnormalities of gait and mobility  Rationale for Evaluation and Treatment: Rehabilitation  ONSET DATE: ~2 months ago  SUBJECTIVE:   SUBJECTIVE STATEMENT: Went to [place] this weekend on a bus tour and did pretty well.   PERTINENT HISTORY: Ankle surgery (unsure which LE), ACL repair   PAIN:  Are you having pain? No  PRECAUTIONS: Other: Per referring MD okay with him continuing plantarflexion strengthening exercises as well as including dorsiflexion exercises  now with advancement per PT discretion.  I did add bilateral calf raises for him to start in the meantime before starting PT  WEIGHT BEARING RESTRICTIONS: No  FALLS:  Has patient fallen in last 6 months? No  LIVING ENVIRONMENT: Lives with: lives with their family Lives in: House/apartment Stairs:avoiding stairs at the moment, yard is sloped  Has following equipment at home: None  OCCUPATION: retired  PLOF: Independent  PATIENT GOALS: get back to  walking walking 1 hr a day, sloped neighborhood and swing a golf club   OBJECTIVE:   DIAGNOSTIC FINDINGS:  Limited musculoskeletal ultrasound of the left lower extremity, left  Achilles and posterior leg was performed today.  Evaluation of the distal  Achilles both in short and long axis shows proper insertion over the  calcaneus without tendon irregularity.  Has rescan to the mid and proximal  aspect of the Achilles there is a high-grade tear near the myotendinous  junction with significant hypoechoic fluid in hyperemia.  There is no  significant tendon retraction, although this is near full-thickness.   Evaluation proximally of the gastroc and soleus shows no abnormality in  the mid belly.  Plantaris was visualized without abnormality on limited  view.   PATIENT SURVEYS:  EVAL: FOTO 59, goal 78 03/02/23: 84  COGNITION: Overall cognitive status: Within functional limits for tasks assessed     SENSATION: Denied numbess or tingling  EDEMA:  EVAL: Circumferential: 8cm proximal from lateral maleolus Rt: 23cm, Lt: 28.5cm   MUSCLE LENGTH: Hamstrings: Right  deg; Left  deg [name] test: Right  deg; Left  deg  POSTURE: weight shift right  PALPATION: Muscle spasm Lt gatroc   LOWER EXTREMITY ROM:  Active ROM Left eval Left 02/23/23 Left 03/02/23  Ankle dorsiflexion Knee flex: 12 Knee ext: 10 Supine knee ext: 10 Standing knee flex: 23 Standing knee flex: 21  Ankle plantarflexion 30 46   Ankle inversion 15 14   Ankle eversion 5 10    (Blank rows = not tested)  LOWER EXTREMITY MMT:  MMT Right eval Left eval  Hip flexion  5  Hip extension 5 5  Hip abduction 4+ 4+  Hip adduction    Hip internal rotation    Hip external rotation 4 4  Knee flexion  5  Knee extension    Ankle dorsiflexion  4  Ankle plantarflexion  Not tested, unable to complete BLE heel raises through full ROM on Lt  Ankle inversion  4  Ankle eversion  4   (Blank rows = not tested)  FUNCTIONAL  TESTS:  EVAL: SLS: unable on each, increased ankle rocking noted on Lt 02/23/23: Lt without UE support x 3 trials:  5.72 sec; 10.12 sec, 10.41 sec  GAIT: EVAL: Distance walked: 35ft Assistive device utilized: None  Comments: decreased push off on Lt, minimal trunk rotation, no pain noted                     TODAY'S TREATMENT:                                                      DATE:03/02/2023 TherEx NuStep L6 x 6 min  Lt calf raises 2x10 with RLE toe touch; light UE support Gastroc incline board stretch 30 sec x 3 Squat with heel raise 2x10; 10# KB Reviewed/updated  HEP - pt with good understanding of new exercises.     TODAY'S TREATMENT:                                                      DATE:02/25/2023 TherEx NuStep L6 x 6 min Lt calf raises 2x10 with RLE toe touch; light UE support Squat with heel raise 2x10; 10# KB Gastroc incline board stretch 30 sec x 3 Discussed current plan and transition to community fitness over the next few sessions  Neuro Re-ed Rockerboard ant/post and lateral x 2 min each direction; light touch working on decreasing support Tandem stance on foam in // bars occasional HHA 30 sec x 3 bil SLS on foam x 5 trials; up to 5-7 sec on LLE  TODAY'S TREATMENT:                                                      DATE: 02/23/23 TherEx NuStep L6 x 6 min Calf stretch on incline board 3x30 sec Calf raises 3x10 off incline board; cues for Lt weight shift (focus on Lt only for eccentric) ROM measurements - see above for details Calf raises with L3 band on tension around ankles 3x10  Neuro Re-Ed SLS on Lt without UE support x 3 trials:  5.72 sec; 10.12 sec, 10.41 sec Braiding in // bars x 5 laps Tandem walking fwd/bwd on foam in // bars occasional HHA 6 ft x 5 laps  TODAY'S TREATMENT:                                                      DATE:02/18/23 TherEx NuStep L6 x 6 min Calf raises 3x10 off incline board; cues for Lt weight shift Calf stretch on incline  board 3x30 sec Seated calf raise with 10# DB 3x10  Neuro Re-Ed SLS on Lt without UE support x 10 reps 3-8 sec  Tandem walking fwd on foam in // bars occasional HHA 6 ft x 5 laps Toe walking on foam beam in // bars x 5 laps; intermittent UE support   TODAY'S TREATMENT:                                                      DATE:02/16/2023 TherEx UBE LE only 5 mins lvl 4.0 PF double leg up, shift to Lt WB as much as tolerated 2 x 10  Step on over and down 6 inch step WB on Lt leg no UE assist x10 Lateral step down WB on Lt leg 6 inch step x 15 slow lowering focus  Gastroc incline board stretch 30 sec x 6  Neuro [employer] light touching double leg x 30 Tandem stance on foam in // bars occasional HHA 1 min x 1 bilateral Tandem ambulation fwd on foam in // bars occasional HHA 6 ft x 8  PATIENT EDUCATION:  Education details: eval findings/POC; implemented HEP; swelling management Person educated: Patient Education method: Explanation and Handouts Education comprehension: verbalized understanding and returned demonstration  HOME EXERCISE PROGRAM: Access Code: [email or web address] URL: [email or web address] Date: 02/09/2023 Prepared by: [facility] - Outpatient Rehab - [facility]  Exercises - Heel Raises with Counter Support with Brace On  - 2 x daily - 7 x weekly - 1 sets - 10 reps - Great Toe Flexion with Resistance  - 2 x daily - 7 x weekly - 10 reps - Long Sitting Ankle Dorsiflexion with Anchored Resistance  - 2 x daily - 7 x weekly - 10 reps  ASSESSMENT:  CLINICAL IMPRESSION: PT has met 4/6 LTGs and feel with continued exercise is on track to meet remaining 2 goals but at this time he is pleased with his progress.  Will hold PT x 30 day and he will return if needed, otherwise will plan to d/c.  OBJECTIVE IMPAIRMENTS: decreased activity tolerance, decreased endurance, decreased knowledge of condition, decreased mobility, difficulty walking,  decreased ROM, decreased strength, hypomobility, increased edema, increased muscle spasms, impaired flexibility, improper body mechanics, and pain.   ACTIVITY LIMITATIONS: squatting, stairs, locomotion level, and recreation  PARTICIPATION LIMITATIONS: interpersonal relationship, community activity, and yard work  PERSONAL FACTORS: Age, Time since onset of injury/illness/exacerbation, and 1 comorbidity: previous LE surgeries  are also affecting patient's functional outcome.   REHAB POTENTIAL: Good  CLINICAL DECISION MAKING: Evolving/moderate complexity  EVALUATION COMPLEXITY: Moderate   GOALS: Goals reviewed with patient? Yes  SHORT TERM GOALS: Target date: 02/16/23 Pt will be independent with his initial HEP to safely progress LE strength and ROM. Baseline: Goal status: on going 02/16/2023  2.  Pt will have improvement in gastroc strength and mechanics, evident by his ability to complete 10 Bil heel raises through full ankle ROM and without weight shift to the Rt.  Baseline:  Goal status:on going 02/16/2023   LONG TERM GOALS: Target date: 03/23/23  Pt will have 20 deg of Lt ankle DF  and atleast 45 deg of Lt ankle PF to improve mechanics with daily activity.  Baseline:  Goal status: MET 03/02/23   2.  Pt will have improved ankle proprioception evident by his ability to maintain SLS up to 5 sec on the Lt, 2/3 trials.  Baseline:  Goal status: MET 02/23/23  3.  Pt's FOTO score will improve to 78 from the start of PT. Baseline:  Goal status: MET 03/02/23  4.  Pt will be able to complete atleast 20 single leg heel raises on the Lt without increase in pain. Baseline:  Goal status: in progress 03/02/23  5.  Pt will be able to return to walking up to 1 hour a day without increase in pain. Baseline:  Goal status: Partially Met 03/02/23; up to a mile, but hasn't tried up to 1 hours yet  6.  Pt will report being able to return to 75% effort golf swing without difficulty, once cleared by  MD. Baseline:  Goal status: IN PROGRESS (plans to try 75% this week; up to 50%) 03/02/23   PLAN:  PT FREQUENCY: 2x/week  PT DURATION: 6 weeks  PLANNED INTERVENTIONS: Therapeutic exercises, Therapeutic activity, Neuromuscular re-education, Balance training, Gait training, Patient/Family education, Self Care, Joint mobilization, Stair training, Aquatic Therapy, Dry Needling, Cryotherapy, Taping, Vasopneumatic device, Manual therapy, and Re-evaluation  PLAN FOR NEXT SESSION: hold PT x 30 days; d/c or recert   Clarita Crane, PT, DPT 03/02/23 10:52 AM  PHYSICAL THERAPY DISCHARGE SUMMARY  Visits from Start of Care: 7  Current functional level related to goals / functional outcomes: See note   Remaining deficits: See note   Education / Equipment: HEP  Patient goals were partially met. Patient is being discharged due to not returning since the last visit.  Chyrel Masson, PT, DPT, OCS, ATC 04/09/23  9:13 AM

## 2023-03-04 ENCOUNTER — Encounter: Payer: PPO | Admitting: Physical Therapy

## 2023-03-09 DIAGNOSIS — Z1211 Encounter for screening for malignant neoplasm of colon: Secondary | ICD-10-CM | POA: Diagnosis not present

## 2023-03-09 DIAGNOSIS — K5730 Diverticulosis of large intestine without perforation or abscess without bleeding: Secondary | ICD-10-CM | POA: Diagnosis not present

## 2023-03-09 DIAGNOSIS — K640 First degree hemorrhoids: Secondary | ICD-10-CM | POA: Diagnosis not present

## 2023-03-09 DIAGNOSIS — D124 Benign neoplasm of descending colon: Secondary | ICD-10-CM | POA: Diagnosis not present

## 2023-03-09 DIAGNOSIS — D120 Benign neoplasm of cecum: Secondary | ICD-10-CM | POA: Diagnosis not present

## 2023-03-11 DIAGNOSIS — D124 Benign neoplasm of descending colon: Secondary | ICD-10-CM | POA: Diagnosis not present

## 2023-03-23 ENCOUNTER — Ambulatory Visit: Payer: PPO | Admitting: Podiatry

## 2023-03-23 ENCOUNTER — Encounter: Payer: Self-pay | Admitting: Podiatry

## 2023-03-23 ENCOUNTER — Ambulatory Visit (INDEPENDENT_AMBULATORY_CARE_PROVIDER_SITE_OTHER): Payer: PPO

## 2023-03-23 ENCOUNTER — Ambulatory Visit: Payer: PPO

## 2023-03-23 DIAGNOSIS — M7662 Achilles tendinitis, left leg: Secondary | ICD-10-CM

## 2023-03-23 DIAGNOSIS — M722 Plantar fascial fibromatosis: Secondary | ICD-10-CM | POA: Diagnosis not present

## 2023-03-23 DIAGNOSIS — M205X2 Other deformities of toe(s) (acquired), left foot: Secondary | ICD-10-CM

## 2023-03-23 MED ORDER — TRIAMCINOLONE ACETONIDE 10 MG/ML IJ SUSP
10.0000 mg | Freq: Once | INTRAMUSCULAR | Status: AC
  Administered 2023-03-23: 10 mg via INTRA_ARTICULAR

## 2023-03-23 NOTE — Progress Notes (Signed)
Patient wants to call [employer] first to see coverage before committing here for casting  Patient will call to reset if he wants to move forward / patient will need appt for scans   Addison Bailey Cped, CFo, CFm

## 2023-03-23 NOTE — Progress Notes (Signed)
Subjective:   Patient ID: Travis Jenkins, male   DOB: 72 y.o.   MRN: [id number]   HPI Patient states that he has had problems with pain in his left heel that he is recovering from an Achilles tendon partial tear left and he has had problems with this.  States that worse after being on his foot and also at times has had problems with the right 1   Review of Systems  All other systems reviewed and are negative.       Objective:  Physical Exam Vitals and nursing note reviewed.  Constitutional:      Appearance: He is well-developed.  Pulmonary:     Effort: Pulmonary effort is normal.  Musculoskeletal:        General: Normal range of motion.  Skin:    General: Skin is warm.  Neurological:     Mental Status: He is alert.     Neurovascular status intact muscle strength found to be adequate range of motion adequate with patient noted to have discomfort mostly in the plantar of the left heel moderate in the right and history of just generalized foot pain when he is on his feet for any type of extensive.  With history of orthotics.  Arch is also become tender for him with good digital perfusion well-oriented     Assessment:  Patient who seems to be dealing with some form of fascial inflammation bilateral but also may have some other kind of condition has tried gabapentin in the past without success F2 acute Planter fasciitis left over right heel possibility for partial tear Achilles tendon left even though I detected no loss of muscle strength     Plan:  Continuation of above with moderate cavus foot structure.  Reviewed his H&P is x-rays I went ahead today for the left I did sterile prep I injected the fascial insertion 3 mg Kenalog 5 mg Xylocaine and went ahead and casted him for functional orthotic devices.  I will add 1/4 inch heel lift bilateral patient will be seen back when they are returned  X-rays indicate no signs of fracture small spur formation

## 2023-03-23 NOTE — Patient Instructions (Addendum)
sPlantar Fasciitis (Heel Spur Syndrome) with Rehab The plantar fascia is a fibrous, ligament-like, soft-tissue structure that spans the bottom of the foot. Plantar fasciitis is a condition that causes pain in the foot due to inflammation of the tissue. SYMPTOMS  Pain and tenderness on the underneath side of the foot. Pain that worsens with standing or walking. CAUSES  Plantar fasciitis is caused by irritation and injury to the plantar fascia on the underneath side of the foot. Common mechanisms of injury include: Direct trauma to bottom of the foot. Damage to a small nerve that runs under the foot where the main fascia attaches to the heel bone. Stress placed on the plantar fascia due to bone spurs. RISK INCREASES WITH:  Activities that place stress on the plantar fascia (running, jumping, pivoting, or cutting). Poor strength and flexibility. Improperly fitted shoes. Tight calf muscles. Flat feet. Failure to warm-up properly before activity. Obesity. PREVENTION Warm up and stretch properly before activity. Allow for adequate recovery between workouts. Maintain physical fitness: Strength, flexibility, and endurance. Cardiovascular fitness. Maintain a health body weight. Avoid stress on the plantar fascia. Wear properly fitted shoes, including arch supports for individuals who have flat feet.  PROGNOSIS  If treated properly, then the symptoms of plantar fasciitis usually resolve without surgery. However, occasionally surgery is necessary.  RELATED COMPLICATIONS  Recurrent symptoms that may result in a chronic condition. Problems of the lower back that are caused by compensating for the injury, such as limping. Pain or weakness of the foot during push-off following surgery. Chronic inflammation, scarring, and partial or complete fascia tear, occurring more often from repeated injections.  TREATMENT  Treatment initially involves the use of ice and medication to help reduce pain and  inflammation. The use of strengthening and stretching exercises may help reduce pain with activity, especially stretches of the Achilles tendon. These exercises may be performed at home or with a therapist. Your caregiver may recommend that you use heel cups of arch supports to help reduce stress on the plantar fascia. Occasionally, corticosteroid injections are given to reduce inflammation. If symptoms persist for greater than 6 months despite non-surgical (conservative), then surgery may be recommended.   MEDICATION  If pain medication is necessary, then nonsteroidal anti-inflammatory medications, such as aspirin and ibuprofen, or other minor pain relievers, such as acetaminophen, are often recommended. Do not take pain medication within 7 days before surgery. Prescription pain relievers may be given if deemed necessary by your caregiver. Use only as directed and only as much as you need. Corticosteroid injections may be given by your caregiver. These injections should be reserved for the most serious cases, because they may only be given a certain number of times.  HEAT AND COLD Cold treatment (icing) relieves pain and reduces inflammation. Cold treatment should be applied for 10 to 15 minutes every 2 to 3 hours for inflammation and pain and immediately after any activity that aggravates your symptoms. Use ice packs or massage the area with a piece of ice (ice massage). Heat treatment may be used prior to performing the stretching and strengthening activities prescribed by your caregiver, physical therapist, or athletic trainer. Use a heat pack or soak the injury in warm water.  SEEK IMMEDIATE MEDICAL CARE IF: Treatment seems to offer no benefit, or the condition worsens. Any medications produce adverse side effects.  EXERCISES- RANGE OF MOTION (ROM) AND STRETCHING EXERCISES - Plantar Fasciitis (Heel Spur Syndrome) These exercises may help you when beginning to rehabilitate your injury. Your  symptoms may resolve with or without further involvement from your physician, physical therapist or athletic trainer. While completing these exercises, remember:  Restoring tissue flexibility helps normal motion to return to the joints. This allows healthier, less painful movement and activity. An effective stretch should be held for at least 30 seconds. A stretch should never be painful. You should only feel a gentle lengthening or release in the stretched tissue.  RANGE OF MOTION - Toe Extension, Flexion Sit with your right / left leg crossed over your opposite knee. Grasp your toes and gently pull them back toward the top of your foot. You should feel a stretch on the bottom of your toes and/or foot. Hold this stretch for 10 seconds. Now, gently pull your toes toward the bottom of your foot. You should feel a stretch on the top of your toes and or foot. Hold this stretch for 10 seconds. Repeat  times. Complete this stretch 3 times per day.   RANGE OF MOTION - Ankle Dorsiflexion, Active Assisted Remove shoes and sit on a chair that is preferably not on a carpeted surface. Place right / left foot under knee. Extend your opposite leg for support. Keeping your heel down, slide your right / left foot back toward the chair until you feel a stretch at your ankle or calf. If you do not feel a stretch, slide your bottom forward to the edge of the chair, while still keeping your heel down. Hold this stretch for 10 seconds. Repeat 3 times. Complete this stretch 2 times per day.   STRETCH  Gastroc, Standing Place hands on wall. Extend right / left leg, keeping the front knee somewhat bent. Slightly point your toes inward on your back foot. Keeping your right / left heel on the floor and your knee straight, shift your weight toward the wall, not allowing your back to arch. You should feel a gentle stretch in the right / left calf. Hold this position for 10 seconds. Repeat 3 times. Complete this  stretch 2 times per day.  STRETCH  Soleus, Standing Place hands on wall. Extend right / left leg, keeping the other knee somewhat bent. Slightly point your toes inward on your back foot. Keep your right / left heel on the floor, bend your back knee, and slightly shift your weight over the back leg so that you feel a gentle stretch deep in your back calf. Hold this position for 10 seconds. Repeat 3 times. Complete this stretch 2 times per day.  STRETCH  Gastrocsoleus, Standing  Note: This exercise can place a lot of stress on your foot and ankle. Please complete this exercise only if specifically instructed by your caregiver.  Place the ball of your right / left foot on a step, keeping your other foot firmly on the same step. Hold on to the wall or a rail for balance. Slowly lift your other foot, allowing your body weight to press your heel down over the edge of the step. You should feel a stretch in your right / left calf. Hold this position for 10 seconds. Repeat this exercise with a slight bend in your right / left knee. Repeat 3 times. Complete this stretch 2 times per day.   STRENGTHENING EXERCISES - Plantar Fasciitis (Heel Spur Syndrome)  These exercises may help you when beginning to rehabilitate your injury. They may resolve your symptoms with or without further involvement from your physician, physical therapist or athletic trainer. While completing these exercises, remember:  Muscles can  gain both the endurance and the strength needed for everyday activities through controlled exercises. Complete these exercises as instructed by your physician, physical therapist or athletic trainer. Progress the resistance and repetitions only as guided.  STRENGTH - Towel Curls Sit in a chair positioned on a non-carpeted surface. Place your foot on a towel, keeping your heel on the floor. Pull the towel toward your heel by only curling your toes. Keep your heel on the floor. Repeat 3 times.  Complete this exercise 2 times per day.  STRENGTH - Ankle Inversion Secure one end of a rubber exercise band/tubing to a fixed object (table, pole). Loop the other end around your foot just before your toes. Place your fists between your knees. This will focus your strengthening at your ankle. Slowly, pull your big toe up and in, making sure the band/tubing is positioned to resist the entire motion. Hold this position for 10 seconds. Have your muscles resist the band/tubing as it slowly pulls your foot back to the starting position. Repeat 3 times. Complete this exercises 2 times per day.  Document Released: 08/04/2005 Document Revised: 10/27/2011 Document Reviewed: 11/16/2008 [facility] Patient Information 2014 [place], [place].

## 2023-03-26 ENCOUNTER — Ambulatory Visit: Payer: PPO | Admitting: Podiatry

## 2023-03-30 ENCOUNTER — Telehealth: Payer: Self-pay | Admitting: Podiatry

## 2023-03-30 NOTE — Telephone Encounter (Signed)
Patient called this morning wants to know if you got the chance to speak with Dr Charlsie Merles about his upcoming casting appointment? Patient would like a call back today.

## 2023-03-31 DIAGNOSIS — M62830 Muscle spasm of back: Secondary | ICD-10-CM | POA: Diagnosis not present

## 2023-04-01 ENCOUNTER — Ambulatory Visit: Payer: PPO

## 2023-04-01 NOTE — Progress Notes (Signed)
Orthotic eval   Patient was seen, measured for custom molded foot orthotics  Patient will benefit from CFO's as they will help provide total contact to MLA's helping to better distribute body weight across BIL feet greater reducing plantar pressure and pain and to also encourage FF and RF alignment  Patient was scanned items to be ordered and fit when in  Patient did not want heel lifts mentioned in previous note ok'd by Dr Charlsie Merles to not add  Addison Bailey CPed, CFo, CFm

## 2023-04-03 ENCOUNTER — Encounter: Payer: Self-pay | Admitting: Physical Medicine and Rehabilitation

## 2023-04-03 ENCOUNTER — Ambulatory Visit: Payer: PPO | Admitting: Physical Medicine and Rehabilitation

## 2023-04-03 DIAGNOSIS — M546 Pain in thoracic spine: Secondary | ICD-10-CM

## 2023-04-03 DIAGNOSIS — S29019A Strain of muscle and tendon of unspecified wall of thorax, initial encounter: Secondary | ICD-10-CM | POA: Diagnosis not present

## 2023-04-03 MED ORDER — PREDNISONE 50 MG PO TABS: 50.0000 mg | ORAL_TABLET | Freq: Every day | ORAL | 0 refills | Status: DC

## 2023-04-03 MED ORDER — MELOXICAM 15 MG PO TABS: 15.0000 mg | ORAL_TABLET | Freq: Every day | ORAL | 0 refills | Status: DC

## 2023-04-03 NOTE — Progress Notes (Unsigned)
Travis Jenkins - 72 y.o. male MRN [id number]  Date of birth: Nov 27, 1950  Office Visit Note: Visit Date: 04/03/2023 PCP: Darrow Bussing, MD Referred by: Darrow Bussing, MD  Subjective: Chief Complaint  Patient presents with   Lower Back - Pain   HPI: Travis Jenkins is a 72 y.o. male who comes in today as a self referral for evaluation of acute bilateral thoracic pain. Pain started about 1 week ago, worsened with movement and activity. He describes pain as sore and aching, currently rates as 8 out of 10. Becomes severe with yard work and [employer]. Some relief of pain with home exercise regimen, heating pad, rest and use of medications. Some relief of pain with Ibuprofen and Flexeril. No history of thoracic imaging/injections. He reports history of chronic lower back pain, he was evaluated by our partner Dr. 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 No issues with lower back at this time. Patient denies focal weakness, numbness and tingling. No recent trauma or falls.    Review of Systems  Musculoskeletal:  Positive for back pain and myalgias.  Neurological:  Negative for tingling, sensory change, focal weakness and weakness.  All other systems reviewed and are negative.  Otherwise per HPI.  Assessment & Plan: Visit Diagnoses:    ICD-10-CM   1. Acute bilateral thoracic back pain  M54.6     2. Thoracic myofascial strain, initial encounter  S29.019A        Plan: Findings:  Acute bilateral thoracic back pain. Pain has improved over the last several days, he continues with conservative therapies such as home exercise regimen, heating pad, rest and medications. Patients clinical presentation and exam are consistent with thoracic strain. Tenderness noted to bilateral thoracic paraspinal regions upon exam today. I explain to patient that thoracic strain injuries can take 6-8 weeks to heal. I prescribed  short course of oral Prednisone and Meloxicam. He can continue with Flexeril at bedtime. He seems to already be feeling much better over the last several days. I would like to see him back in 6 weeks for re-evaluation. Should his pain persist I would consider short course of formal physical therapy and thoracic spine x-rays. Patient has no questions regarding plan. We are happy to see him back for lumbar issues as well. No red flag symptoms noted upon exam today.       Meds & Orders:  Meds ordered this encounter  Medications   predniSONE (DELTASONE) 50 MG tablet    Sig: Take 1 tablet (50 mg total) by mouth daily with breakfast. Take until completed.    Dispense:  5 tablet    Refill:  0   meloxicam (MOBIC) 15 MG tablet    Sig: Take 1 tablet (15 mg total) by mouth daily.    Dispense:  30 tablet    Refill:  0   No orders of the defined types were placed in this encounter.   Follow-up: Return for 6 week follow up for re-evaluation.   Procedures: No procedures performed      Clinical History: No specialty comments available.   He reports that he quit smoking about 51 years ago. His smoking use included cigarettes. He started smoking about 56 years ago. He has a 6 pack-year smoking history. He has never used smokeless tobacco. No results for input(s): "HGBA1C", "LABURIC" in the last 8760 hours.  Objective:  VS:  HT:    WT:  BMI:     BP:   HR: bpm  TEMP: ( )  RESP:  Physical Exam Vitals and nursing note reviewed.  HENT:     Head: Normocephalic and atraumatic.     Right Ear: External ear normal.     Left Ear: External ear normal.     Nose: Nose normal.     Mouth/Throat:     Mouth: Mucous membranes are moist.  Eyes:     Extraocular Movements: Extraocular movements intact.  Cardiovascular:     Rate and Rhythm: Normal rate.     Pulses: Normal pulses.  Pulmonary:     Effort: Pulmonary effort is normal.  Abdominal:     General: Abdomen is flat. There is no distension.   Musculoskeletal:        General: Tenderness present.     Cervical back: Normal range of motion.     Comments: Normal thoracic kyphosis noted. No tenderness noted upon palpation of spinous processes. No step off deformity. No pain noted with flexion, extension, lateral bending and rotation. Tenderness noted to bilateral thoracic paraspinal regions. Good strength noted to bilateral upper extremities.    Skin:    General: Skin is warm and dry.     Capillary Refill: Capillary refill takes less than 2 seconds.  Neurological:     General: No focal deficit present.     Mental Status: He is alert and oriented to person, place, and time.  Psychiatric:        Mood and Affect: Mood normal.        Behavior: Behavior normal.     Ortho Exam  Imaging: No results found.  Past Medical/Family/Surgical/Social History: Medications & Allergies reviewed per EMR, new medications updated. 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   CAD (coronary artery disease)    Coronary artery disease    IBS (irritable bowel syndrome)    Raynaud's disease    Syncope    Family History  Problem Relation Age of Onset   Hyperlipidemia Father    Diabetes Father    Heart disease Father    Diabetes Mother    Parkinson's disease Mother    Heart disease Mother    Past Surgical History:  Procedure Laterality Date   ANKLE FRACTURE SURGERY     ANTERIOR CRUCIATE LIGAMENT REPAIR  1990   CARDIAC CATHETERIZATION     CHOLECYSTECTOMY N/A 02/17/2018   Procedure: LAPAROSCOPIC CHOLECYSTECTOMY;  Surgeon: Emelia Loron, MD;  Location: [facility];  Service: General;  Laterality: N/A;   CORONARY PRESSURE/FFR STUDY N/A 05/01/2017   Procedure: INTRAVASCULAR PRESSURE WIRE/FFR STUDY;  Surgeon: Yvonne Kendall, MD;  Location: [facility];  Service: Cardiovascular;  Laterality: N/A;   CORONARY STENT  INTERVENTION N/A 05/01/2017   Procedure: CORONARY STENT INTERVENTION;  Surgeon: Yvonne Kendall, MD;  Location: [facility];  Service: Cardiovascular;  Laterality: N/A;   LEFT HEART CATH AND CORONARY ANGIOGRAPHY N/A 05/01/2017   Procedure: LEFT HEART CATH AND CORONARY ANGIOGRAPHY;  Surgeon: Yvonne Kendall, MD;  Location: [facility];  Service: Cardiovascular;  Laterality: N/A;   MASS EXCISION Right 02/07/2019   Procedure: RIGHT LONG FINGER EXCISION MASS;  Surgeon: Betha Loa, MD;  Location: [facility];  Service: Orthopedics;  Laterality: Right;   Social History   Occupational History   Not on file  Tobacco Use   Smoking status:  Former    Current packs/day: 0.00    Average packs/day: 0.5 packs/day for 12.0 years (6.0 ttl pk-yrs)    Types: Cigarettes    Start date: 06/20/1966    Quit date: 08/19/1971    Years since quitting: 51.6   Smokeless tobacco: Never  Substance and Sexual Activity   Alcohol use: Yes    Alcohol/week: 4.0 standard drinks of alcohol    Types: 4 Cans of beer per week    Comment: social   Drug use: No   Sexual activity: Not on file

## 2023-04-03 NOTE — Progress Notes (Unsigned)
Functional Pain Scale - descriptive words and definitions  Mild (2)   Noticeable when not distracted/no impact on ADL's/sleep only slightly affected and able to   use both passive and active distraction for comfort. Mild range order  Average Pain 2, but was at an 8 last week  Upper back pain below the shoulder blades. Started about  week ago

## 2023-04-15 ENCOUNTER — Encounter: Payer: Self-pay | Admitting: Cardiovascular Disease

## 2023-04-29 ENCOUNTER — Ambulatory Visit: Payer: PPO | Admitting: Orthopedic Surgery

## 2023-04-30 ENCOUNTER — Other Ambulatory Visit: Payer: Self-pay | Admitting: Physical Medicine and Rehabilitation

## 2023-05-14 ENCOUNTER — Ambulatory Visit: Payer: PPO

## 2023-05-14 ENCOUNTER — Ambulatory Visit: Payer: PPO | Admitting: Physical Medicine and Rehabilitation

## 2023-05-14 ENCOUNTER — Telehealth: Payer: Self-pay | Admitting: Physical Medicine and Rehabilitation

## 2023-05-14 NOTE — Telephone Encounter (Signed)
Spoke with patient and cancelled appointment. He will call in to reschedule

## 2023-05-14 NOTE — Telephone Encounter (Signed)
Patient called needing to cancel his appointment due to an emergency. The number to contact patient is [phone]

## 2023-05-14 NOTE — Progress Notes (Signed)
Patient presents today to pick up custom molded foot orthotics, diagnosed with PF by Dr. Charlsie Merles.   Orthotics were dispensed and fit was satisfactory. Reviewed instructions for break-in and wear. Written instructions given to patient.  Patient will follow up as needed.   Addison Bailey CPed, CFo, CFm

## 2023-07-08 DIAGNOSIS — Z0001 Encounter for general adult medical examination with abnormal findings: Secondary | ICD-10-CM | POA: Diagnosis not present

## 2023-07-08 DIAGNOSIS — E7800 Pure hypercholesterolemia, unspecified: Secondary | ICD-10-CM | POA: Diagnosis not present

## 2023-07-08 DIAGNOSIS — Z125 Encounter for screening for malignant neoplasm of prostate: Secondary | ICD-10-CM | POA: Diagnosis not present

## 2023-07-08 DIAGNOSIS — Z79899 Other long term (current) drug therapy: Secondary | ICD-10-CM | POA: Diagnosis not present

## 2023-07-08 DIAGNOSIS — R7303 Prediabetes: Secondary | ICD-10-CM | POA: Diagnosis not present

## 2023-07-13 DIAGNOSIS — M542 Cervicalgia: Secondary | ICD-10-CM | POA: Diagnosis not present

## 2023-07-13 DIAGNOSIS — E7800 Pure hypercholesterolemia, unspecified: Secondary | ICD-10-CM | POA: Diagnosis not present

## 2023-07-13 DIAGNOSIS — R7303 Prediabetes: Secondary | ICD-10-CM | POA: Diagnosis not present

## 2023-07-13 DIAGNOSIS — Z79899 Other long term (current) drug therapy: Secondary | ICD-10-CM | POA: Diagnosis not present

## 2023-07-13 DIAGNOSIS — Z0001 Encounter for general adult medical examination with abnormal findings: Secondary | ICD-10-CM | POA: Diagnosis not present

## 2023-07-31 ENCOUNTER — Encounter: Payer: Self-pay | Admitting: Cardiovascular Disease

## 2023-07-31 NOTE — Telephone Encounter (Signed)
Medication list updated.

## 2023-09-07 ENCOUNTER — Encounter: Payer: Self-pay | Admitting: Cardiovascular Disease

## 2023-09-07 ENCOUNTER — Ambulatory Visit: Payer: PPO | Attending: Cardiovascular Disease | Admitting: Cardiovascular Disease

## 2023-09-07 VITALS — BP 138/77 | HR 56 | Ht 73.5 in | Wt 227.4 lb

## 2023-09-07 DIAGNOSIS — E7800 Pure hypercholesterolemia, unspecified: Secondary | ICD-10-CM

## 2023-09-07 DIAGNOSIS — R7303 Prediabetes: Secondary | ICD-10-CM | POA: Diagnosis not present

## 2023-09-07 DIAGNOSIS — I2510 Atherosclerotic heart disease of native coronary artery without angina pectoris: Secondary | ICD-10-CM | POA: Diagnosis not present

## 2023-09-07 NOTE — Patient Instructions (Signed)

## 2023-09-07 NOTE — Progress Notes (Unsigned)
Cardiology Office Note   Date:  09/09/2023   ID:  [name], DOB 09/05/1950, MRN [id number]   PCP:  Travis Bussing, MD  Cardiologist:  Travis Fair, MD  Electrophysiologist:  None   Evaluation Performed:  Follow-Up Visit  Chief Complaint:  CAD  History of Present Illness:    Travis Jenkins is a 73 y.o. male with CAD now roughly 6 years after placement of a drug-eluting stent to the mid LAD artery (Resolute Onyx 3.5 x 12 mm, 05/01/2017).  His presentation was unusual, syncope after bee stings, with mild cardiac enzyme elevation.  He has not had any coronary or other cardiac events since.  He has been less physically active, especially since the winter months, but continues to play golf.  Has not been swimming recently.  He's gained a little weight, 10 pounds in the last 12 months.  The patient specifically denies any chest pain at rest or with exertion, dyspnea at rest or with exertion, orthopnea, paroxysmal nocturnal dyspnea, syncope, palpitations, focal neurological deficits, intermittent claudication, lower extremity edema, unexplained weight gain, cough, hemoptysis or wheezing.   The reduction in physical activity and weight gain probably explains why his HDL has worsened, now down to 39 (it was 51 last year).  Continues have very well-controlled LDL at 52, but triglycerides are now also borderline at 146 and his hemoglobin A1c is 5.7%.Marland Kitchen   Past Medical History:  Diagnosis Date   Bradycardia 05/01/2017   CAD (coronary artery disease)    Coronary artery disease    IBS (irritable bowel syndrome)    Raynaud's disease    Syncope    Past Surgical History:  Procedure Laterality Date   ANKLE FRACTURE SURGERY     ANTERIOR CRUCIATE LIGAMENT REPAIR  1990   CARDIAC CATHETERIZATION     CHOLECYSTECTOMY N/A 02/17/2018   Procedure: LAPAROSCOPIC CHOLECYSTECTOMY;  Surgeon: Travis Loron, MD;  Location: [facility] OR;  Service: General;  Laterality: N/A;   CORONARY PRESSURE/FFR  STUDY N/A 05/01/2017   Procedure: INTRAVASCULAR PRESSURE WIRE/FFR STUDY;  Surgeon: Travis Kendall, MD;  Location: [facility];  Service: Cardiovascular;  Laterality: N/A;   CORONARY STENT INTERVENTION N/A 05/01/2017   Procedure: CORONARY STENT INTERVENTION;  Surgeon: Travis Kendall, MD;  Location: [facility];  Service: Cardiovascular;  Laterality: N/A;   LEFT HEART CATH AND CORONARY ANGIOGRAPHY N/A 05/01/2017   Procedure: LEFT HEART CATH AND CORONARY ANGIOGRAPHY;  Surgeon: Travis Kendall, MD;  Location: [facility];  Service: Cardiovascular;  Laterality: N/A;   MASS EXCISION Right 02/07/2019   Procedure: RIGHT LONG FINGER EXCISION MASS;  Surgeon: Travis Loa, MD;  Location: [facility];  Service: Orthopedics;  Laterality: Right;     Current Meds  Medication Sig   amLODipine (NORVASC) 2.5 MG tablet Take 2.5 mg by mouth daily.   aspirin EC 81 MG tablet Take 81 mg by mouth daily.   nitroGLYCERIN (NITROSTAT) 0.4 MG SL tablet PLACE 1 TABLET (0.4 MG TOTAL) UNDER THE TONGUE EVERY 5 (FIVE) MINUTES AS NEEDED FOR CHEST PAIN.   rosuvastatin (CRESTOR) 20 MG tablet TAKE 1 TABLET BY MOUTH EVERY DAY     Allergies:   Wasp venom, Erythromycin, and Tetracyclines & related   Social History   Tobacco Use   Smoking status: Former    Current packs/day: 0.00    Average packs/day: 0.5 packs/day for 12.0 years (6.0 ttl pk-yrs)    Types: Cigarettes    Start date: 06/20/1966    Quit  date: 08/19/1971    Years since quitting: 52.0   Smokeless tobacco: Never  Substance Use Topics   Alcohol use: Yes    Alcohol/week: 4.0 standard drinks of alcohol    Types: 4 Cans of beer per week    Comment: social   Drug use: No     Family Hx: The patient's family history includes Diabetes in his father and mother; Heart disease in his father and mother; Hyperlipidemia in his father; Parkinson's disease in his mother.  ROS:   Please see the history of present illness.    All other  systems are reviewed and are negative.   Prior CV studies:   The following studies were reviewed today:  Labs/Other Tests and Data Reviewed:    EKG:   EKG Interpretation Date/Time:  Monday September 07 2023 15:52:01 EST Ventricular Rate:  56 PR Interval:  172 QRS Duration:  96 QT Interval:  410 QTC Calculation: 395 R Axis:   20  Text Interpretation: Sinus bradycardia When compared with ECG of 12-Feb-2018 04:02, PREVIOUS ECG IS PRESENT Confirmed by Travis Jenkins [phone]) on 09/07/2023 4:07:58 PM         Recent Labs: No results found for requested labs within last 365 days.    Recent Lipid Panel Lab Results  Component Value Date/Time   CHOL 107 02/12/2018 07:56 AM   TRIG 135 02/12/2018 07:56 AM   HDL 40 (L) 02/12/2018 07:56 AM   CHOLHDL 2.7 02/12/2018 07:56 AM   LDLCALC 40 02/12/2018 07:56 AM  05/30/2019  total cholesterol 125, HDL 41, LDL 53, triglycerides 151 Creatinine 1.08, potassium 4.1, normal LFTs, hemoglobin A1c 5.6%  07/02/2022 labs from PCP Cholesterol 131, HDL 51, LDL 61, triglycerides 101 Hemoglobin A1c 6.1%, hemoglobin 14.5, creatinine 1.23, potassium 4.8, ALT 14, TSH 3.00  07/08/2023 labs from PCP Cholesterol 115, HDL 39, LDL 52, triglycerides 146, hemoglobin A1c 5.7 Potassium 4.3, ALT 11, TSH 2.000  Wt Readings from Last 3 Encounters:  09/07/23 227 lb 6.4 oz (103.1 kg)  07/29/22 217 lb 3.2 oz (98.5 kg)  06/27/21 224 lb 12.8 oz (102 kg)     Objective:    Vital Signs:  BP 138/77   Pulse (!) 56   Ht 6' 1.5" (1.867 m)   Wt 227 lb 6.4 oz (103.1 kg)   SpO2 96%   BMI 29.60 kg/m      General: Alert, oriented x3, no distress, appears fit and younger than stated age Head: no evidence of trauma, PERRL, EOMI, no exophtalmos or lid lag, no myxedema, no xanthelasma; normal ears, nose and oropharynx Neck: normal jugular venous pulsations and no hepatojugular reflux; brisk carotid pulses without delay and no carotid bruits Chest: clear to auscultation,  no signs of consolidation by percussion or palpation, normal fremitus, symmetrical and full respiratory excursions Cardiovascular: normal position and quality of the apical impulse, regular rhythm, normal first and second heart sounds, no murmurs, rubs or gallops Abdomen: no tenderness or distention, no masses by palpation, no abnormal pulsatility or arterial bruits, normal bowel sounds, no hepatosplenomegaly Extremities: no clubbing, cyanosis or edema; 2+ radial, ulnar and brachial pulses bilaterally; 2+ right femoral, posterior tibial and dorsalis pedis pulses; 2+ left femoral, posterior tibial and dorsalis pedis pulses; no subclavian or femoral bruits Neurological: grossly nonfocal Psych: Normal mood and affect    ASSESSMENT & PLAN:    1. Coronary artery disease involving native coronary artery of native heart without angina pectoris       CAD: Asymptomatic.  On aspirin  and statin.  Unable to take beta-blockers due to resting bradycardia. HLP: Do not LDL cholesterol but his triglycerides and HDL have worsened with 10 pound weight gain. Prediabetes: Hemoglobin A1c remains borderline elevated.  Advised avoiding sweets and starchy foods and increasing physical activity, try to lose those 10 pounds.  Patient Instructions  Medication Instructions:  No changes *If you need a refill on your cardiac medications before your next appointment, please call your pharmacy*  Follow-Up: At [facility], you and your health needs are our priority.  As part of our continuing mission to provide you with exceptional heart care, we have created designated Provider Care Teams.  These Care Teams include your primary Cardiologist (physician) and Advanced Practice Providers (APPs -  Physician Assistants and Nurse Practitioners) who all work together to provide you with the care you need, when you need it.  We recommend signing up for the patient portal called "MyChart".  Sign up information is  provided on this After Visit Summary.  MyChart is used to connect with patients for Virtual Visits (Telemedicine).  Patients are able to view lab/test results, encounter notes, upcoming appointments, etc.  Non-urgent messages can be sent to your provider as well.   To learn more about what you can do with MyChart, go to [email or web address].    Your next appointment:   1 year(s)  Provider:   Dr Royann Shivers        Signed, Travis Fair, MD  09/09/2023 4:37 PM    [facility]

## 2023-09-09 ENCOUNTER — Encounter: Payer: Self-pay | Admitting: Cardiovascular Disease

## 2023-11-18 DIAGNOSIS — L570 Actinic keratosis: Secondary | ICD-10-CM | POA: Diagnosis not present

## 2023-11-18 DIAGNOSIS — L905 Scar conditions and fibrosis of skin: Secondary | ICD-10-CM | POA: Diagnosis not present

## 2023-11-18 DIAGNOSIS — X32XXXA Exposure to sunlight, initial encounter: Secondary | ICD-10-CM | POA: Diagnosis not present

## 2023-11-18 DIAGNOSIS — Z1283 Encounter for screening for malignant neoplasm of skin: Secondary | ICD-10-CM | POA: Diagnosis not present

## 2023-11-18 DIAGNOSIS — D225 Melanocytic nevi of trunk: Secondary | ICD-10-CM | POA: Diagnosis not present

## 2023-11-18 DIAGNOSIS — D485 Neoplasm of uncertain behavior of skin: Secondary | ICD-10-CM | POA: Diagnosis not present

## 2024-03-03 ENCOUNTER — Ambulatory Visit: Admitting: Podiatry

## 2024-03-03 ENCOUNTER — Ambulatory Visit (INDEPENDENT_AMBULATORY_CARE_PROVIDER_SITE_OTHER)

## 2024-03-03 DIAGNOSIS — M722 Plantar fascial fibromatosis: Secondary | ICD-10-CM | POA: Diagnosis not present

## 2024-03-03 DIAGNOSIS — G629 Polyneuropathy, unspecified: Secondary | ICD-10-CM

## 2024-03-03 MED ORDER — GABAPENTIN 300 MG PO CAPS: 300.0000 mg | ORAL_CAPSULE | Freq: Three times a day (TID) | ORAL | 3 refills | Status: AC

## 2024-03-04 NOTE — Progress Notes (Signed)
 Subjective:   Patient ID: Travis Jenkins, male   DOB: 73 y.o.   MRN: [id number]   HPI Patient presents concerned about pain in his heels of both feet with long-term history and states that they seem to be feeling better but he wants them checked.  Also has neuropathic like discomfort and does take medication for this   ROS      Objective:  Physical Exam  Neurovascular status intact discomfort in the heels but really more of an allover pain with no specific 1 area and seems to emanate into the arch and he does have orthotics from previous.  He has a lot of neuropathic like symptomatology and takes 1 gabapentin  at nighttime for this which controls him some but not during the day     Assessment:  Difficult to say between fasciitis and neuropathic like pain with high arch structure     Plan:  H&P reviewed and at this point I discussed the neuropathic condition and I have recommended starting gabapentin  during the day 100 mg along with his 300 at night.  I reviewed how to do this he can take up to 2 and he will be seen back and we may consider other things that have recommended a herbal medication for him also to try to help build his nerve structure

## 2024-03-29 ENCOUNTER — Encounter: Payer: Self-pay | Admitting: Podiatry

## 2024-04-27 DIAGNOSIS — L821 Other seborrheic keratosis: Secondary | ICD-10-CM | POA: Diagnosis not present

## 2024-06-09 ENCOUNTER — Encounter: Payer: Self-pay | Admitting: Physical Medicine and Rehabilitation

## 2024-06-09 ENCOUNTER — Ambulatory Visit: Admitting: Physical Medicine and Rehabilitation

## 2024-06-09 DIAGNOSIS — M7918 Myalgia, other site: Secondary | ICD-10-CM | POA: Diagnosis not present

## 2024-06-09 DIAGNOSIS — M542 Cervicalgia: Secondary | ICD-10-CM | POA: Diagnosis not present

## 2024-06-09 NOTE — Progress Notes (Signed)
 Pain Scale   Average Pain 8 Patient advising he has neck pain radiating to both sides when turning his head.        +Driver, -BT, -Dye Allergies.

## 2024-06-09 NOTE — Progress Notes (Signed)
 Travis Jenkins - 73 y.o. male MRN [id number]  Date of birth: 1950/08/25  Office Visit Note: Visit Date: 06/09/2024 PCP: Regino Slater, MD Referred by: Regino Slater, MD  Subjective: Chief Complaint  Patient presents with   Lower Back - Pain   HPI: Travis Jenkins is a 73 y.o. male who comes in today for evaluation of acute on chronic bilateral neck pain. Pain started about 6 months ago. He had episode of right neck pain that radiating down to shoulder and chest. These symptoms eventually resolved. His symptoms worsen with movement and activity. He does experience intermittent soreness to bilateral neck, currently denies pain at this time. His pain has resolved over the last week. Some relief of pain with home exercise regimen, rest and use of medications. Good relief of pain with Tylenol  and Ibuprofen. No history of formal physical therapy. He is not a huge fan of formal physical therapy, he prefers to do exercises at home. He is fairly active, plays golf multiple times a week. States he does experience pain to lower back, middle back and neck at times when playing golf. No prior imaging of cervical spine. Patient denies focal weakness, numbness and tingling. No recent trauma or falls.      Review of Systems  Musculoskeletal:  Negative for myalgias and neck pain.  Neurological:  Negative for tingling, sensory change, focal weakness and weakness.  All other systems reviewed and are negative.  Otherwise per HPI.  Assessment & Plan: Visit Diagnoses:    ICD-10-CM   1. Cervicalgia  M54.2     2. Myofascial pain syndrome  M79.18        Plan: Findings:  Acute on chronic bilateral neck pain. His pain has significantly improved over the past week with continued home exercise regimen, rest and use of medications. Patients clinical presentation and exam are complex. I do think this is more of a mechanical neck pain. No pain today upon palpation of cervical paraspinal regions. He does have  somewhat limited cervical range of motion. We discussed treatment plan in detail today. Would like him to focus on isometric neck exercises at home, I do think these exercises would help to strengthen muscles and improve range of motion. I also discussed use of TENS unit, topical pain cream such as diclofenac. Could also look at cervical traction devices. He has no questions at this time. I encouraged him to remain active as tolerated. Should his pain return, our plan is to obtain cervical radiographs and possible cervical MRI imaging. We will see him back as needed.     Meds & Orders: No orders of the defined types were placed in this encounter.  No orders of the defined types were placed in this encounter.   Follow-up: Return if symptoms worsen or fail to improve.   Procedures: No procedures performed      Clinical History: No specialty comments available.   He reports that he quit smoking about 52 years ago. His smoking use included cigarettes. He started smoking about 58 years ago. He has a 6 pack-year smoking history. He has never used smokeless tobacco. No results for input(s): HGBA1C, LABURIC in the last 8760 hours.  Objective:  VS:  HT:    WT:   BMI:     BP:   HR: bpm  TEMP: ( )  RESP:  Physical Exam Vitals and nursing note reviewed.  HENT:     Head: Normocephalic and atraumatic.     Right Ear: External  ear normal.     Left Ear: External ear normal.     Nose: Nose normal.     Mouth/Throat:     Mouth: Mucous membranes are moist.  Eyes:     Extraocular Movements: Extraocular movements intact.  Cardiovascular:     Rate and Rhythm: Normal rate.     Pulses: Normal pulses.  Pulmonary:     Effort: Pulmonary effort is normal.  Abdominal:     General: Abdomen is flat. There is no distension.  Musculoskeletal:        General: No tenderness.     Cervical back: Normal range of motion.     Comments: No discomfort noted with flexion, extension and side-to-side rotation.  Limited cervical range of motion noted. Patient has good strength in the upper extremities including 5 out of 5 strength in wrist extension, long finger flexion and APB. Shoulder range of motion is full bilaterally without any sign of impingement. There is no atrophy of the hands intrinsically. Sensation intact bilaterally. Negative Churchill's sign. Negative Spurling's sign.     Skin:    General: Skin is warm and dry.     Capillary Refill: Capillary refill takes less than 2 seconds.  Neurological:     General: No focal deficit present.     Mental Status: He is alert and oriented to person, place, and time.  Psychiatric:        Mood and Affect: Mood normal.        Behavior: Behavior normal.     Ortho Exam  Imaging: No results found.  Past Medical/Family/Surgical/Social History: Medications & Allergies reviewed per EMR, new medications updated. 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   CAD (coronary artery disease)    Coronary artery disease    IBS (irritable bowel syndrome)    Raynaud's disease    Syncope    Family History  Problem Relation Age of Onset   Hyperlipidemia Father    Diabetes Father    Heart disease Father    Diabetes Mother    Parkinson's disease Mother    Heart disease Mother    Past Surgical History:  Procedure Laterality Date   ANKLE FRACTURE SURGERY     ANTERIOR CRUCIATE LIGAMENT REPAIR  1990   CARDIAC CATHETERIZATION     CHOLECYSTECTOMY N/A 02/17/2018   Procedure: LAPAROSCOPIC CHOLECYSTECTOMY;  Surgeon: Ebbie Cough, MD;  Location: [facility] OR;  Service: General;  Laterality: N/A;   CORONARY PRESSURE/FFR STUDY N/A 05/01/2017   Procedure: INTRAVASCULAR PRESSURE WIRE/FFR STUDY;  Surgeon: Mady Bruckner, MD;  Location: [facility];  Service: Cardiovascular;  Laterality: N/A;   CORONARY STENT INTERVENTION N/A 05/01/2017    Procedure: CORONARY STENT INTERVENTION;  Surgeon: Mady Bruckner, MD;  Location: [facility];  Service: Cardiovascular;  Laterality: N/A;   LEFT HEART CATH AND CORONARY ANGIOGRAPHY N/A 05/01/2017   Procedure: LEFT HEART CATH AND CORONARY ANGIOGRAPHY;  Surgeon: Mady Bruckner, MD;  Location: [facility];  Service: Cardiovascular;  Laterality: N/A;   MASS EXCISION Right 02/07/2019   Procedure: RIGHT LONG FINGER EXCISION MASS;  Surgeon: Murrell Drivers, MD;  Location: [facility];  Service: Orthopedics;  Laterality: Right;   Social History   Occupational History   Not on file  Tobacco Use   Smoking status: Former    Current packs/day: 0.00    Average packs/day: 0.5 packs/day  for 12.0 years (6.0 ttl pk-yrs)    Types: Cigarettes    Start date: 06/20/1966    Quit date: 08/19/1971    Years since quitting: 52.8   Smokeless tobacco: Never  Substance and Sexual Activity   Alcohol use: Yes    Alcohol/week: 4.0 standard drinks of alcohol    Types: 4 Cans of beer per week    Comment: social   Drug use: No   Sexual activity: Not on file

## 2024-06-09 NOTE — Patient Instructions (Signed)

## 2024-06-20 ENCOUNTER — Encounter: Payer: Self-pay | Admitting: Radiology

## 2024-07-20 DIAGNOSIS — Z79899 Other long term (current) drug therapy: Secondary | ICD-10-CM | POA: Diagnosis not present

## 2024-07-20 DIAGNOSIS — R7303 Prediabetes: Secondary | ICD-10-CM | POA: Diagnosis not present

## 2024-07-20 DIAGNOSIS — E7800 Pure hypercholesterolemia, unspecified: Secondary | ICD-10-CM | POA: Diagnosis not present

## 2024-07-27 DIAGNOSIS — Z125 Encounter for screening for malignant neoplasm of prostate: Secondary | ICD-10-CM | POA: Diagnosis not present

## 2024-10-17 ENCOUNTER — Ambulatory Visit: Admitting: Cardiovascular Disease
